# Patient Record
Sex: Female | Born: 1945 | Race: White | Hispanic: No | Smoking: Former smoker
Health system: Southern US, Community
[De-identification: ages and names within clinical notes are randomized; demographics above are authoritative.]

## PROBLEM LIST (undated history)

## (undated) DIAGNOSIS — M1990 Unspecified osteoarthritis, unspecified site: Secondary | ICD-10-CM

## (undated) DIAGNOSIS — K219 Gastro-esophageal reflux disease without esophagitis: Secondary | ICD-10-CM

## (undated) DIAGNOSIS — Z8619 Personal history of other infectious and parasitic diseases: Secondary | ICD-10-CM

## (undated) DIAGNOSIS — I2510 Atherosclerotic heart disease of native coronary artery without angina pectoris: Secondary | ICD-10-CM

## (undated) DIAGNOSIS — R519 Headache, unspecified: Secondary | ICD-10-CM

## (undated) DIAGNOSIS — E0590 Thyrotoxicosis, unspecified without thyrotoxic crisis or storm: Secondary | ICD-10-CM

## (undated) DIAGNOSIS — E785 Hyperlipidemia, unspecified: Secondary | ICD-10-CM

## (undated) DIAGNOSIS — R51 Headache: Secondary | ICD-10-CM

## (undated) DIAGNOSIS — R55 Syncope and collapse: Secondary | ICD-10-CM

## (undated) DIAGNOSIS — I10 Essential (primary) hypertension: Secondary | ICD-10-CM

## (undated) HISTORY — PX: TUBAL LIGATION: SHX77

## (undated) HISTORY — DX: Unspecified osteoarthritis, unspecified site: M19.90

## (undated) HISTORY — DX: Hyperlipidemia, unspecified: E78.5

## (undated) HISTORY — DX: Syncope and collapse: R55

## (undated) HISTORY — DX: Essential (primary) hypertension: I10

## (undated) HISTORY — DX: Thyrotoxicosis, unspecified without thyrotoxic crisis or storm: E05.90

## (undated) HISTORY — DX: Gastro-esophageal reflux disease without esophagitis: K21.9

## (undated) HISTORY — DX: Personal history of other infectious and parasitic diseases: Z86.19

---

## 2008-06-10 ENCOUNTER — Encounter: Payer: Self-pay | Admitting: Family Medicine

## 2008-07-18 ENCOUNTER — Encounter (INDEPENDENT_AMBULATORY_CARE_PROVIDER_SITE_OTHER): Payer: Self-pay | Admitting: *Deleted

## 2008-07-18 LAB — CONVERTED CEMR LAB
Glucose, Bld: 104 mg/dL
Hgb A1c MFr Bld: 6.6 %

## 2009-06-04 ENCOUNTER — Encounter (INDEPENDENT_AMBULATORY_CARE_PROVIDER_SITE_OTHER): Payer: Self-pay | Admitting: *Deleted

## 2009-06-10 ENCOUNTER — Ambulatory Visit: Payer: Self-pay | Admitting: Family Medicine

## 2009-06-10 DIAGNOSIS — E785 Hyperlipidemia, unspecified: Secondary | ICD-10-CM | POA: Insufficient documentation

## 2009-06-10 DIAGNOSIS — R209 Unspecified disturbances of skin sensation: Secondary | ICD-10-CM | POA: Insufficient documentation

## 2009-06-10 DIAGNOSIS — R42 Dizziness and giddiness: Secondary | ICD-10-CM | POA: Insufficient documentation

## 2009-06-10 DIAGNOSIS — E278 Other specified disorders of adrenal gland: Secondary | ICD-10-CM | POA: Insufficient documentation

## 2009-06-10 DIAGNOSIS — E039 Hypothyroidism, unspecified: Secondary | ICD-10-CM | POA: Insufficient documentation

## 2009-06-10 DIAGNOSIS — N644 Mastodynia: Secondary | ICD-10-CM | POA: Insufficient documentation

## 2009-06-10 DIAGNOSIS — J309 Allergic rhinitis, unspecified: Secondary | ICD-10-CM | POA: Insufficient documentation

## 2009-06-10 DIAGNOSIS — K219 Gastro-esophageal reflux disease without esophagitis: Secondary | ICD-10-CM | POA: Insufficient documentation

## 2009-06-10 DIAGNOSIS — I10 Essential (primary) hypertension: Secondary | ICD-10-CM | POA: Insufficient documentation

## 2009-06-15 ENCOUNTER — Telehealth (INDEPENDENT_AMBULATORY_CARE_PROVIDER_SITE_OTHER): Payer: Self-pay | Admitting: *Deleted

## 2009-06-15 LAB — CONVERTED CEMR LAB
ALT: 38 units/L — ABNORMAL HIGH (ref 0–35)
AST: 33 units/L (ref 0–37)
Albumin: 4.7 g/dL (ref 3.5–5.2)
Alkaline Phosphatase: 89 units/L (ref 39–117)
BUN: 17 mg/dL (ref 6–23)
Basophils Absolute: 0 10*3/uL (ref 0.0–0.1)
Basophils Relative: 0.6 % (ref 0.0–3.0)
Bilirubin, Direct: 0 mg/dL (ref 0.0–0.3)
CO2: 29 meq/L (ref 19–32)
Calcium: 9.7 mg/dL (ref 8.4–10.5)
Chloride: 101 meq/L (ref 96–112)
Cholesterol: 180 mg/dL (ref 0–200)
Cortisol, Plasma: 16.7 ug/dL
Creatinine, Ser: 0.8 mg/dL (ref 0.4–1.2)
Eosinophils Absolute: 0.1 10*3/uL (ref 0.0–0.7)
Eosinophils Relative: 1.3 % (ref 0.0–5.0)
GFR calc non Af Amer: 76.63 mL/min (ref >60)
Glucose, Bld: 105 mg/dL — ABNORMAL HIGH (ref 70–99)
HCT: 41.5 % (ref 36.0–46.0)
HDL: 52.4 mg/dL (ref >39.00)
Hemoglobin: 14.4 g/dL (ref 12.0–15.0)
LDL Cholesterol: 98 mg/dL (ref 0–99)
Lymphocytes Relative: 22.8 % (ref 12.0–46.0)
Lymphs Abs: 1.9 10*3/uL (ref 0.7–4.0)
MCHC: 34.9 g/dL (ref 30.0–36.0)
MCV: 89.9 fL (ref 78.0–100.0)
Monocytes Absolute: 0.7 10*3/uL (ref 0.1–1.0)
Monocytes Relative: 8.6 % (ref 3.0–12.0)
Neutro Abs: 5.6 10*3/uL (ref 1.4–7.7)
Neutrophils Relative %: 66.7 % (ref 43.0–77.0)
Platelets: 274 10*3/uL (ref 150.0–400.0)
Potassium: 3.9 meq/L (ref 3.5–5.1)
RBC: 4.61 M/uL (ref 3.87–5.11)
RDW: 13.4 % (ref 11.5–14.6)
Sodium: 139 meq/L (ref 135–145)
TSH: 3.31 microintl units/mL (ref 0.35–5.50)
Total Bilirubin: 0.5 mg/dL (ref 0.3–1.2)
Total CHOL/HDL Ratio: 3
Total Protein: 7.8 g/dL (ref 6.0–8.3)
Triglycerides: 148 mg/dL (ref 0.0–149.0)
VLDL: 29.6 mg/dL (ref 0.0–40.0)
Vit D, 25-Hydroxy: 11 ng/mL — ABNORMAL LOW (ref 30–89)
WBC: 8.3 10*3/uL (ref 4.5–10.5)

## 2009-07-10 ENCOUNTER — Other Ambulatory Visit: Admission: RE | Admit: 2009-07-10 | Discharge: 2009-07-10 | Payer: Self-pay | Admitting: Family Medicine

## 2009-07-10 ENCOUNTER — Ambulatory Visit: Payer: Self-pay | Admitting: Family Medicine

## 2009-07-16 ENCOUNTER — Encounter (INDEPENDENT_AMBULATORY_CARE_PROVIDER_SITE_OTHER): Payer: Self-pay | Admitting: *Deleted

## 2009-07-16 LAB — CONVERTED CEMR LAB: Pap Smear: NEGATIVE

## 2009-07-17 ENCOUNTER — Encounter (INDEPENDENT_AMBULATORY_CARE_PROVIDER_SITE_OTHER): Payer: Self-pay | Admitting: *Deleted

## 2009-07-28 ENCOUNTER — Encounter (INDEPENDENT_AMBULATORY_CARE_PROVIDER_SITE_OTHER): Payer: Self-pay | Admitting: *Deleted

## 2009-08-11 ENCOUNTER — Encounter: Admission: RE | Admit: 2009-08-11 | Discharge: 2009-08-11 | Payer: Self-pay | Admitting: Family Medicine

## 2009-08-11 ENCOUNTER — Encounter (INDEPENDENT_AMBULATORY_CARE_PROVIDER_SITE_OTHER): Payer: Self-pay | Admitting: *Deleted

## 2009-08-11 LAB — HM MAMMOGRAPHY: HM Mammogram: NORMAL

## 2009-08-13 ENCOUNTER — Ambulatory Visit: Payer: Self-pay | Admitting: Gastroenterology

## 2009-08-25 ENCOUNTER — Ambulatory Visit: Payer: Self-pay | Admitting: Gastroenterology

## 2009-08-27 ENCOUNTER — Encounter: Payer: Self-pay | Admitting: Gastroenterology

## 2009-08-28 LAB — HM COLONOSCOPY: HM Colonoscopy: NORMAL

## 2009-09-14 ENCOUNTER — Ambulatory Visit: Payer: Self-pay | Admitting: Family Medicine

## 2009-09-14 DIAGNOSIS — E559 Vitamin D deficiency, unspecified: Secondary | ICD-10-CM | POA: Insufficient documentation

## 2009-09-15 LAB — CONVERTED CEMR LAB: Vit D, 25-Hydroxy: 32 ng/mL (ref 30–89)

## 2009-12-02 ENCOUNTER — Ambulatory Visit (HOSPITAL_BASED_OUTPATIENT_CLINIC_OR_DEPARTMENT_OTHER): Admission: RE | Admit: 2009-12-02 | Discharge: 2009-12-02 | Payer: Self-pay | Admitting: Family Medicine

## 2009-12-02 ENCOUNTER — Ambulatory Visit: Payer: Self-pay | Admitting: Diagnostic Radiology

## 2009-12-02 ENCOUNTER — Ambulatory Visit: Payer: Self-pay | Admitting: Family Medicine

## 2009-12-02 DIAGNOSIS — M549 Dorsalgia, unspecified: Secondary | ICD-10-CM | POA: Insufficient documentation

## 2010-02-05 ENCOUNTER — Ambulatory Visit: Payer: Self-pay | Admitting: Family Medicine

## 2010-02-16 ENCOUNTER — Telehealth (INDEPENDENT_AMBULATORY_CARE_PROVIDER_SITE_OTHER): Payer: Self-pay | Admitting: *Deleted

## 2010-03-29 ENCOUNTER — Telehealth (INDEPENDENT_AMBULATORY_CARE_PROVIDER_SITE_OTHER): Payer: Self-pay | Admitting: *Deleted

## 2010-03-30 NOTE — Letter (Signed)
Summary: [facility] Instructions  [facility]  [address] [place], [place] [address]   Phone: [phone]  Fax: [phone]       IMAGENE BOSS    [birth year]    MRN: [id number]        Procedure Day /Date:  Tuesday 08/25/2009     Arrival Time: 10:00 am      Procedure Time: 11:00  am     Location of Procedure:                    _ x_  [facility] (4th Floor)                        PREPARATION FOR COLONOSCOPY WITH MOVIPREP   Starting 5 days prior to your procedure Thursday 6/23 do not eat nuts, seeds, popcorn, corn, beans, peas,  salads, or any raw vegetables.  Do not take any fiber supplements (e.g. Metamucil, Citrucel, and Benefiber).  THE DAY BEFORE YOUR PROCEDURE         DATE: Monday 6/27  1.  Drink clear liquids the entire day-NO SOLID FOOD  2.  Do not drink anything colored red or purple.  Avoid juices with pulp.  No orange juice.  3.  Drink at least 64 oz. (8 glasses) of fluid/clear liquids during the day to prevent dehydration and help the prep work efficiently.  CLEAR LIQUIDS INCLUDE: Water Jello Ice Popsicles Tea (sugar ok, no milk/cream) Powdered fruit flavored drinks Coffee (sugar ok, no milk/cream) Gatorade Juice: apple, white grape, white cranberry  Lemonade Clear bullion, consomm, broth Carbonated beverages (any kind) Strained chicken noodle soup Hard Candy                             4.  In the morning, mix first dose of MoviPrep solution:    Empty 1 Pouch A and 1 Pouch B into the disposable container    Add lukewarm drinking water to the top line of the container. Mix to dissolve    Refrigerate (mixed solution should be used within 24 hrs)  5.  Begin drinking the prep at 5:00 p.m. The MoviPrep container is divided by 4 marks.   Every 15 minutes drink the solution down to the next mark (approximately 8 oz) until the full liter is complete.   6.  Follow completed prep with 16 oz of clear liquid of your choice  (Nothing red or purple).  Continue to drink clear liquids until bedtime.  7.  Before going to bed, mix second dose of MoviPrep solution:    Empty 1 Pouch A and 1 Pouch B into the disposable container    Add lukewarm drinking water to the top line of the container. Mix to dissolve    Refrigerate  THE DAY OF YOUR PROCEDURE      DATE: Tuesday 6/28  Beginning at 6:00 a.m. (5 hours before procedure):         1. Every 15 minutes, drink the solution down to the next mark (approx 8 oz) until the full liter is complete.  2. Follow completed prep with 16 oz. of clear liquid of your choice.    3. You may drink clear liquids until 9:00 am (2 HOURS BEFORE PROCEDURE).   MEDICATION INSTRUCTIONS  Unless otherwise instructed, you should take regular prescription medications with a small sip of water   as early as possible the morning  of your procedure.    Additional medication instructions: Do not take HCTZ morning of procedure.         OTHER INSTRUCTIONS  You will need a responsible adult at least 66 years of age to accompany you and drive you home.   This person must remain in the waiting room during your procedure.  Wear loose fitting clothing that is easily removed.  Leave jewelry and other valuables at home.  However, you may wish to bring a book to read or  an iPod/MP3 player to listen to music as you wait for your procedure to start.  Remove all body piercing jewelry and leave at home.  Total time from sign-in until discharge is approximately 2-3 hours.  You should go home directly after your procedure and rest.  You can resume normal activities the  day after your procedure.  The day of your procedure you should not:   Drive   Make legal decisions   Operate machinery   Drink alcohol   Return to work  You will receive specific instructions about eating, activities and medications before you leave.    The above instructions have been reviewed and explained to  me by   Ezra Sites RN  August 13, 2009 9:32 AM     I fully understand and can verbalize these instructions _____________________________ Date _________

## 2010-03-30 NOTE — Letter (Signed)
Summary: Cancer Screening/[facility] Personalized Risk Profile  Cancer Screening/[facility] Personalized Risk Profile   Imported By: Lanelle Bal 07/16/2009 12:51:15  _____________________________________________________________________  External Attachment:    Type:   Image     Comment:   External Document

## 2010-03-30 NOTE — Assessment & Plan Note (Signed)
Summary: CPX--PH   Vital Signs:  Patient profile:   65 year old female Height:      66.50 inches Weight:      187 pounds BMI:     29.84 Pulse rate:   57 / minute BP sitting:   130 / 72  (left arm)  Vitals Entered By: Doristine Devoid (Jul 10, 2009 8:43 AM) CC: CPX AND PAP   History of Present Illness: 65 yo woman here today for CPE.  parasthesia of thigh- improved w/out taking Neurontin  Health Maintainence- due for mammogram, overdue for colonoscopy.  Preventive Screening-Counseling & Management  Alcohol-Tobacco     Alcohol drinks/day: <1     Smoking Status: never  Caffeine-Diet-Exercise     Does Patient Exercise: yes     Type of exercise: walking      Drug Use:  never.    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 Take One Tablet Daily 2)  Lisinopril 40 Mg Tabs (Lisinopril) .... Take One Tablet Daily 3)  Hydrocortisone 20 Mg Tabs (Hydrocortisone) .... Take One Tablet Daily 4)  Hydrochlorothiazide 25 Mg Tabs (Hydrochlorothiazide) .... Take One Tablet Daily 5)  Paroxetine Hcl 10 Mg Tabs (Paroxetine Hcl) .... Take One Tablet Daily 6)  Amlodipine Besylate 10 Mg Tabs (Amlodipine Besylate) .... Take One Tablet Daily 7)  Simvastatin 40 Mg Tabs (Simvastatin) .... Take One Tablet Daily 8)  Ergocalciferol 50000 Unit Caps (Ergocalciferol) .Marland Kitchen.. 1 By Mouth Weekly For 12 Weeks  Allergies (verified): 1)  ! 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    Physical Exam  General:  Well-developed,well-nourished,in no acute distress; alert,appropriate and cooperative throughout examination Head:  NCAT Eyes:  PERRL, EOMI, fundi WNL Ears:  External ear exam shows no significant lesions or deformities.  Otoscopic examination reveals clear canals, tympanic membranes are intact bilaterally without bulging, retraction, inflammation or discharge. Hearing is grossly normal bilaterally. Nose:  External nasal examination shows no deformity or inflammation. Nasal mucosa are pink and moist without lesions or exudates. Mouth:  Oral mucosa and oropharynx without lesions or exudates.  Teeth in good repair. Neck:  No deformities, masses, or tenderness noted. Breasts:  No mass, nodules, thickening, tenderness, bulging, retraction, inflamation, nipple discharge or skin changes noted.   Lungs:  Normal respiratory effort, chest expands symmetrically. Lungs are clear to auscultation, no crackles or wheezes. Heart:  Normal rate and regular  rhythm. S1 and S2 normal without gallop, murmur, click, rub or other  extra sounds. Abdomen:  soft, NT/ND, +BS Genitalia:  Normal introitus for age, no external lesions, no vaginal discharge, mucosa pink and moist, no vaginal or cervical lesions, no vaginal atrophy, no friaility or hemorrhage, normal uterus size and position, no adnexal masses or tenderness Msk:  No deformity or scoliosis noted of thoracic or lumbar spine.   Pulses:  +2 carotid, radial, DP Extremities:  No clubbing, cyanosis, edema, or deformity noted with normal full range of motion of all joints.   Neurologic:  No cranial nerve deficits noted. Station and gait are normal. Plantar reflexes are down-going bilaterally. DTRs are symmetrical throughout. Sensory, motor and coordinative functions appear intact. Skin:  Intact without suspicious lesions or rashes Cervical Nodes:  No lymphadenopathy noted Axillary Nodes:  No palpable lymphadenopathy Psych:  Cognition and judgment appear intact. Alert and cooperative with normal attention span and concentration. No apparent delusions, illusions, hallucinations   Impression & Recommendations:  Problem # 1:  PHYSICAL EXAMINATION (ICD-V70.0) Assessment Unchanged  pt's PE WNL.  labs from last visit reviewed.  pap collected.  anticipatory guidance provided.  Orders: Radiology Referral (Radiology)  Problem # 2:  SPECIAL SCREENING FOR MALIGNANT NEOPLASMS COLON (ICD-V76.51) Assessment: New routine screen due Orders: Gastroenterology Referral (GI)  Complete Medication List: 1)  Levothyroxine Sodium 50 Mcg Tabs (Levothyroxine sodium) .... Take one tablet daily 2)  Lisinopril 40 Mg Tabs (Lisinopril) .... Take one tablet daily 3)  Hydrocortisone 20 Mg Tabs (Hydrocortisone) .... Take one tablet daily 4)  Hydrochlorothiazide 25 Mg Tabs (Hydrochlorothiazide) .... Take one tablet daily 5)  Paroxetine Hcl 10 Mg Tabs (Paroxetine hcl) .... Take one tablet daily 6)  Amlodipine Besylate 10 Mg Tabs (Amlodipine besylate) .... Take one tablet daily 7)  Simvastatin  40 Mg Tabs (Simvastatin) .... Take one tablet daily 8)  Ergocalciferol 50000 Unit Caps (Ergocalciferol) .Marland Kitchen.. 1 by mouth weekly for 12 weeks  Other Orders: EKG w/ Interpretation (93000)  Patient Instructions: 1)  Please schedule a follow-up appointment in 6 months to recheck blood pressure and cholesterol. 2)  Someone will call you with your mammogram and colonoscopy appts 3)  Make sure you get your Vit D rechecked when you finish your meds 4)  Keep up the good work on diet and exercise 5)  Call with any questions or concerns 6)  Have a great trip!

## 2010-03-30 NOTE — Progress Notes (Signed)
Summary: labs-lmom  Phone Note Outgoing Call   Call placed by: Doristine Devoid,  June 15, 2009 10:54 AM Call placed to: Patient Summary of Call: labs normal.  pt's cortisol level is normal.  once we receive her records and determine what she is taking the hydrocortisone for we can discuss weaning or stopping meds.  level is very low.  needs 50,000 units x 12 weeks and then recheck level  Follow-up for Phone Call        left message on machine ........Marland KitchenDoristine Devoid  June 15, 2009 10:54 AM   Pt informed of lab and rx telephoned to pharmacy, pt scheduled for lab .Kandice Hams  June 16, 2009 2:53 PM  Follow-up by: Kandice Hams,  June 16, 2009 2:53 PM    New/Updated Medications: ERGOCALCIFEROL 50000 UNIT CAPS (ERGOCALCIFEROL) 1 by mouth weekly for 12 weeks Prescriptions: ERGOCALCIFEROL 50000 UNIT CAPS (ERGOCALCIFEROL) 1 by mouth weekly for 12 weeks  #12 x 0   Entered by:   Kandice Hams   Authorized by:   Neena Rhymes MD   Signed by:   Kandice Hams on 06/16/2009   Method used:   Telephoned to ...       [facility] # [phone]* (retail)       [address]       [place], [place]  [address]       Ph: [phone]       Fax: [phone]   RxID:   [id number]

## 2010-03-30 NOTE — Letter (Signed)
Summary: New Patient Letter  [facility]  [address] [place], [place] [address]   Phone: [phone]  Fax: [phone]       06/04/2009 MRN: [id number]  Ann Wagner [address] [place], [place]  [address]  Dear [name],   Welcome to [facility] and thank you for choosing [place] as your Primary Care Providers. Enclosed you will find information about our practice that we hope you find helpful. We have also enclosed forms to be filled out prior to your visit. This will provide [place] with the necessary information and facilitate your being seen in a timely manner. If you have any questions, please call us at:  [phone]        and we will be happy to assist you. We look forward to seeing you at your scheduled appointment time.  Appointment   Morene Antu 10,2725 @ 10:00AM            with Dr.    Beverely Low             Sincerely,  Primary Health Care Team  Please arrive 15 minutes early for your first appointment and bring your insurance card. Co-pay is required at the time of your visit.  *****Please call the office if you are not able to keep this appointment. There is a charge of $50.00 if any appointment is not cancelled or rescheduled within 24 hours.

## 2010-03-30 NOTE — Assessment & Plan Note (Signed)
Summary: back pain/cbs   Vital Signs:  Patient profile:   65 year old female Height:      66.50 inches Weight:      181 pounds BMI:     28.88 O2 Sat:      96 % on Room air Temp:     97.9 degrees F oral Pulse rate:   57 / minute BP sitting:   110 / 72  (left arm)  Vitals Entered By: Doristine Devoid CMA (December 02, 2009 2:10 PM)  O2 Flow:  Room air CC: back pain- hx of walking pneumonia some discomfort w/ deep breaths    History of Present Illness: 65 yo woman here today w/ back pain.  sxs started last week as a stabbing pain in L thoracic area.  took 600mg  of ibuprofen two times a day w/ some improvement.  feeling febrile but no temp.  + body aches.  dry cough, no improvement w/ mucinex.  hx of 'walking PNA'.  chronic steroid use- has been on hydrocortisone for 3 yrs.  has been taking 10mg  x18 months.  would like to wean  Current Medications (verified): 1)  Levothyroxine Sodium 50 Mcg Tabs (Levothyroxine Sodium) .... Take One Tablet Daily 2)  Lisinopril 40 Mg Tabs (Lisinopril) .... Take One Tablet Daily 3)  Hydrocortisone 5 Mg Tabs (Hydrocortisone) .Marland Kitchen.. 1 Tab Daily 4)  Hydrochlorothiazide 25 Mg Tabs (Hydrochlorothiazide) .... Take One Tablet Daily 5)  Paroxetine Hcl 10 Mg Tabs (Paroxetine Hcl) .... Take One Tablet Daily 6)  Amlodipine Besylate 10 Mg Tabs (Amlodipine Besylate) .... Take One Tablet Daily 7)  Simvastatin 40 Mg Tabs (Simvastatin) .... Take One Tablet Daily 8)  Ergocalciferol 50000 Unit Caps (Ergocalciferol) .Marland Kitchen.. 1 By Mouth Weekly For 12 Weeks 9)  Ibuprofen 800 Mg Tabs (Ibuprofen) .Marland Kitchen.. 1 Tab Every 8 Hrs As Needed For Pain.  Take W/ Food.  Allergies (verified): 1)  ! Darvocet  Review of Systems      See HPI  Physical Exam  General:  Well-developed,well-nourished,in no acute distress; alert,appropriate and cooperative throughout examination Head:  NCAT, no TTP over sinuses Eyes:  no injxn or inflammation Ears:  External ear exam shows no significant lesions or  deformities.  Otoscopic examination reveals clear canals, tympanic membranes are intact bilaterally without bulging, retraction, inflammation or discharge. Hearing is grossly normal bilaterally. Nose:  mild congestion Mouth:  Oral mucosa and oropharynx without lesions or exudates.  Teeth in good repair. Neck:  No deformities, masses, or tenderness noted. Lungs:  Normal respiratory effort, chest expands symmetrically. Lungs are clear to auscultation, no crackles or wheezes. Heart:  Normal rate and regular rhythm. S1 and S2 normal without gallop, murmur, click, rub or other extra sounds. Msk:  pt w/ TTP over L lat, no bony tenderness Pulses:  +2 carotid, radial, DP Extremities:  no C/C/E   Impression & Recommendations:  Problem # 1:  BACK PAIN (ICD-724.5) Assessment New pt's pain likely muscular given location and lack of bony tenderness.  pain improves w/ NSAIDs.  use heating pad for relief.  check CXR given pt's hx of PNA.  reviewed supportive care and red flags that should prompt return.  Pt expresses understanding and is in agreement w/ this plan. Her updated medication list for this problem includes:    Ibuprofen 800 Mg Tabs (Ibuprofen) .Marland Kitchen... 1 tab every 8 hrs as needed for pain.  take w/ food.  Orders: T-2 View CXR (71020TC)  Problem # 2:  OTHER SPECIFIED DISORDERS OF ADRENAL GLANDS (  ICD-255.8) Assessment: Unchanged pt's cortisol levels have been stable, would like to wean off meds.  will do slow wean given pt's chronic steroid use.  decrease to 5 mg daily.  plan on at least 6 months at this dose.  Complete Medication List: 1)  Levothyroxine Sodium 50 Mcg Tabs (Levothyroxine sodium) .... Take one tablet daily 2)  Lisinopril 40 Mg Tabs (Lisinopril) .... Take one tablet daily 3)  Hydrocortisone 5 Mg Tabs (Hydrocortisone) .Marland Kitchen.. 1 tab daily 4)  Hydrochlorothiazide 25 Mg Tabs (Hydrochlorothiazide) .... Take one tablet daily 5)  Paroxetine Hcl 10 Mg Tabs (Paroxetine hcl) .... Take one  tablet daily 6)  Amlodipine Besylate 10 Mg Tabs (Amlodipine besylate) .... Take one tablet daily 7)  Simvastatin 40 Mg Tabs (Simvastatin) .... Take one tablet daily 8)  Ergocalciferol 50000 Unit Caps (Ergocalciferol) .Marland Kitchen.. 1 by mouth weekly for 12 weeks 9)  Ibuprofen 800 Mg Tabs (Ibuprofen) .Marland Kitchen.. 1 tab every 8 hrs as needed for pain.  take w/ food.  Patient Instructions: 1)  Follow up as needed 2)  Go to the [facility] (intersection of [address]) for your chest xray 3)  Take the ibuprofen as needed for pain 4)  Use a heating pad for pain relief 5)  Add Claritin or Zyrtec for the allergy component 6)  I think this is muscular and should improve! 7)  Hang in there!!! Prescriptions: IBUPROFEN 800 MG TABS (IBUPROFEN) 1 tab every 8 hrs as needed for pain.  take w/ food.  #90 x 1   Entered and Authorized by:   Neena Rhymes MD   Signed by:   Neena Rhymes MD on 12/02/2009   Method used:   Electronically to        [facility] # [phone]* (retail)       [address]       [place], [place]  [address]       Ph: [phone]       Fax: [phone]   RxID:   [id number] HYDROCORTISONE 5 MG TABS (HYDROCORTISONE) 1 tab daily  #30 x 6   Entered and Authorized by:   Neena Rhymes MD   Signed by:   Neena Rhymes MD on 12/02/2009   Method used:   Electronically to        [facility] # [phone]* (retail)       [address]       [place], [place]  [address]       Ph: [phone]       Fax: [phone]   RxID:   [id number]

## 2010-03-30 NOTE — Assessment & Plan Note (Signed)
Summary: cough, sinus issue//fd   Vital Signs:  Patient profile:   65 year old female Weight:      184 pounds BMI:     29.36 Temp:     98.4 degrees F oral BP sitting:   130 / 80  (left arm)  Vitals Entered By: Doristine Devoid CMA (February 05, 2010 3:59 PM) CC: sinus drainage and cough x2 days    History of Present Illness: 65 yo woman here today for sinus congestion.  sxs started 2-3 days ago.  cough productive of 'yellow/green' sputum.  cough worse in AM/PM.  no fevers.  some tightness in chest w/ deep breaths.  denies facial pain.  ears feel full.  denies sore throat.  no known sick contacts.  Current Medications (verified): 1)  Levothyroxine Sodium 50 Mcg Tabs (Levothyroxine Sodium) .... Take One Tablet Daily 2)  Lisinopril 40 Mg Tabs (Lisinopril) .... Take One Tablet Daily 3)  Hydrocortisone 5 Mg Tabs (Hydrocortisone) .Marland Kitchen.. 1 Tab Daily 4)  Hydrochlorothiazide 25 Mg Tabs (Hydrochlorothiazide) .... Take One Tablet Daily 5)  Paroxetine Hcl 10 Mg Tabs (Paroxetine Hcl) .... Take One Tablet Daily 6)  Amlodipine Besylate 10 Mg Tabs (Amlodipine Besylate) .... Take One Tablet Daily 7)  Simvastatin 40 Mg Tabs (Simvastatin) .... Take One Tablet Daily 8)  Ergocalciferol 50000 Unit Caps (Ergocalciferol) .Marland Kitchen.. 1 By Mouth Weekly For 12 Weeks 9)  Ibuprofen 800 Mg Tabs (Ibuprofen) .Marland Kitchen.. 1 Tab Every 8 Hrs As Needed For Pain.  Take W/ Food.  Allergies (verified): 1)  ! Darvocet  Review of Systems      See HPI  Physical Exam  General:  Well-developed,well-nourished,in no acute distress; alert,appropriate and cooperative throughout examination Head:  NCAT, no TTP over sinuses Eyes:  no injxn or inflammation Ears:  External ear exam shows no significant lesions or deformities.  Otoscopic examination reveals clear canals, tympanic membranes are intact bilaterally without bulging, retraction, inflammation or discharge. Hearing is grossly normal bilaterally. Nose:  mild congestion Mouth:  Oral  mucosa and oropharynx without lesions or exudates.  Teeth in good repair. Neck:  No deformities, masses, or tenderness noted. Lungs:  Normal respiratory effort, chest expands symmetrically. Lungs are clear to auscultation, no crackles or wheezes.  dry cough during exam Heart:  Normal rate and regular rhythm. S1 and S2 normal without gallop, murmur, click, rub or other extra sounds.   Impression & Recommendations:  Problem # 1:  BRONCHITIS- ACUTE (ICD-466.0) Assessment New pt very concerned about the upcoming weekend and 'a history of things settling in my chest'.  start Zpack.  reviewed supportive care and red flags that should prompt return.  Pt expresses understanding and is in agreement w/ this plan. Her updated medication list for this problem includes:    Azithromycin 250 Mg Tabs (Azithromycin) .Marland Kitchen... 2 by  mouth today and then 1 daily for 4 days    Tessalon 200 Mg Caps (Benzonatate) .Marland Kitchen... Take one capsule by mouth three times a day as needed for cough  Complete Medication List: 1)  Levothyroxine Sodium 50 Mcg Tabs (Levothyroxine sodium) .... Take one tablet daily 2)  Lisinopril 40 Mg Tabs (Lisinopril) .... Take one tablet daily 3)  Hydrocortisone 5 Mg Tabs (Hydrocortisone) .Marland Kitchen.. 1 tab daily 4)  Hydrochlorothiazide 25 Mg Tabs (Hydrochlorothiazide) .... Take one tablet daily 5)  Paroxetine Hcl 10 Mg Tabs (Paroxetine hcl) .... Take one tablet daily 6)  Amlodipine Besylate 10 Mg Tabs (Amlodipine besylate) .... Take one tablet daily 7)  Simvastatin 40  Mg Tabs (Simvastatin) .... Take one tablet daily 8)  Ergocalciferol 50000 Unit Caps (Ergocalciferol) .Marland Kitchen.. 1 by mouth weekly for 12 weeks 9)  Ibuprofen 800 Mg Tabs (Ibuprofen) .Marland Kitchen.. 1 tab every 8 hrs as needed for pain.  take w/ food. 10)  Azithromycin 250 Mg Tabs (Azithromycin) .... 2 by  mouth today and then 1 daily for 4 days 11)  Tessalon 200 Mg Caps (Benzonatate) .... Take one capsule by mouth three times a day as needed for cough  Patient  Instructions: 1)  This is an early bronchitis 2)  Take the Azithromycin as directed 3)  Use the Tessalon as needed for cough 4)  Continue the Mucinex to thin your congestion 5)  Drink plenty of fluids 6)  Tylenol/Ibuprofen as needed for pain or fever 7)  Hang in there!!! 8)  Happy Holidays! Prescriptions: TESSALON 200 MG CAPS (BENZONATATE) Take one capsule by mouth three times a day as needed for cough  #60 x 0   Entered and Authorized by:   Neena Rhymes MD   Signed by:   Neena Rhymes MD on 02/05/2010   Method used:   Electronically to        [facility] # [phone]* (retail)       [address]       [place], [place]  [address]       Ph: [phone]       Fax: [phone]   RxID:   [id number] AZITHROMYCIN 250 MG  TABS (AZITHROMYCIN) 2 by  mouth today and then 1 daily for 4 days  #6 x 0   Entered and Authorized by:   Neena Rhymes MD   Signed by:   Neena Rhymes MD on 02/05/2010   Method used:   Electronically to        [facility] # [phone]* (retail)       [address]       [place], [place]  [address]       Ph: [phone]       Fax: [phone]   RxID:   [id number]    Orders Added: 1)  Est. Patient Level III [[address]]

## 2010-03-30 NOTE — Miscellaneous (Signed)
Summary: [facility]  Clinical Lists Changes  Medications: Added new medication of MOVIPREP 100 GM  SOLR (PEG-KCL-NACL-NASULF-NA ASC-C) As per prep instructions. - Signed Rx of MOVIPREP 100 GM  SOLR (PEG-KCL-NACL-NASULF-NA ASC-C) As per prep instructions.;  #1 x 0;  Signed;  Entered by: Ezra Sites RN;  Authorized by: Louis Meckel MD;  Method used: Electronically to [facility] # [phone]*, [address], [place], [place]  [address], Ph: [phone], Fax: [phone]    Prescriptions: MOVIPREP 100 GM  SOLR (PEG-KCL-NACL-NASULF-NA ASC-C) As per prep instructions.  #1 x 0   Entered by:   Ezra Sites RN   Authorized by:   Louis Meckel MD   Signed by:   Ezra Sites RN on 08/13/2009   Method used:   Electronically to        [facility] # [phone]* (retail)       [address]       [place], [place]  [address]       Ph: [phone]       Fax: [phone]   RxID:   [id number]

## 2010-03-30 NOTE — Miscellaneous (Signed)
  Clinical Lists Changes  Observations: Added new observation of HGBA1C: 6.6 % (07/18/2008 15:23) Added new observation of GLUCOSE SER: 104 mg/dL (16/11/9602 54:09) Added new observation of BONE DENSITY: normal std dev (06/10/2008 15:38)      Preventive Care Screening  Bone Density:    Date:  06/10/2008    Results:  normal std dev

## 2010-03-30 NOTE — Letter (Signed)
Summary: Results Letter  [facility]  [address] [place], [place] [address]   Phone: [phone]  Fax: [phone]        August 27, 2009 MRN: [id number]    Ann Wagner [address] [place], [place]  [address]    Dear [name],  Your biiopsy results did not show any remarkable findings.  I recommend that you undergo followup surveillance colonloscopy in 10 years. Please continue with the recommendations previously discussed.  Should you have any further questions or immediate concers, feel free to contact me.  Sincerely,  Barbette Hair. Arlyce Dice, M.D., [facility]          Sincerely,  Louis Meckel MD  This letter has been electronically signed by your physician.  Appended Document: Results Letter letter mailed 7.6.11

## 2010-03-30 NOTE — Letter (Signed)
Summary: Previsit letter  [facility]  [address] [place], [place] [address]   Phone: [phone]  Fax: [phone]       07/17/2009 MRN: [id number]  Ann Wagner [address] [place], [place]  [address]  Dear [name],  Welcome to the Gastroenterology Division at [facility].    You are scheduled to see a nurse for your pre-procedure visit on 08-13-09 at 9:00a.m. on the 3rd floor at [facility], [address]  We ask that you try to arrive at our office 15 minutes prior to your appointment time to allow for check-in.  Your nurse visit will consist of discussing your medical and surgical history, your immediate family medical history, and your medications.    Please bring a complete list of all your medications or, if you prefer, bring the medication bottles and we will list them.  We will need to be aware of both prescribed and over the counter drugs.  We will need to know exact dosage information as well.  If you are on blood thinners (Coumadin, Plavix, Aggrenox, Ticlid, etc.) please call our office today/prior to your appointment, as we need to consult with your physician about holding your medication.   Please be prepared to read and sign documents such as consent forms, a financial agreement, and acknowledgement forms.  If necessary, and with your consent, a friend or relative is welcome to sit-in on the nurse visit with you.  Please bring your insurance card so that we may make a copy of it.  If your insurance requires a referral to see a specialist, please bring your referral form from your primary care physician.  No co-pay is required for this nurse visit.     If you cannot keep your appointment, please call [phone] to cancel or reschedule prior to your appointment date.  This allows [place] the opportunity to schedule an appointment for another patient in need of care.    Thank you for choosing [facility] for your medical needs.   We appreciate the opportunity to care for you.  Please visit [place] at our website  to learn more about our practice.                     Sincerely.                                                                                                                   The Gastroenterology Division

## 2010-03-30 NOTE — Assessment & Plan Note (Signed)
Summary: NEW PT/BCBS/NS/KDC   Vital Signs:  Patient profile:   65 year old female Height:      66.50 inches Weight:      184 pounds BMI:     29.36 Temp:     98.2 degrees F oral Pulse rate:   60 / minute BP sitting:   140 / 70  (left arm)  Vitals Entered By: Doristine Devoid (June 10, 2009 10:18 AM) CC: NEW EST- pain in L leg x3 months  Comments -L leg symptoms burning, sensitive discomfort constant when walking    History of Present Illness: 65 yo woman here today to establish care.  recently moved from [place].  1) HTN- on Lisinopril, HCTZ, Amlodipine.  denies CP, SOB, HAs, visual changes, edema.  2) Hyperlipidemia- taking Simvastatin, had normal cath after slightly abnormal stress test.  no N/V, myalgias.  3) Hypothyroid- on replacement tx.  4) Dizziness- had (+) tilt test in [place], last month had dizzy spell after standing up too fast.  reports they started Paroxetine for dizziness.  still has occasional episodes.  was told after w/u that she had vagal episodes.  5) L leg pain- described as a cold sensation which then became a burning sensation.  first occured 'months ago'.  now occuring more frequently.  localized to L lateral thigh.  never developed a rash (pt feared shingles).  no weakness.  6) L breast pain- 'pinching' when overtired.  doesn't occur frequently.  no SOB, diaphoresis, nausea.  Preventive Screening-Counseling & Management  Alcohol-Tobacco     Alcohol drinks/day: <1     Smoking Status: never  Caffeine-Diet-Exercise     Does Patient Exercise: no      Drug Use:  never.    Current Medications (verified): 1)  Levothyroxine Sodium 50 Mcg Tabs (Levothyroxine Sodium) .... Take One Tablet Daily 2)  Lisinopril 40 Mg Tabs (Lisinopril) .... Take One Tablet Daily 3)  Hydrocortisone 20 Mg Tabs (Hydrocortisone) .... Take One Tablet Daily 4)  Hydrochlorothiazide 25 Mg Tabs (Hydrochlorothiazide) .... Take One Tablet Daily 5)  Paroxetine Hcl 10 Mg Tabs (Paroxetine Hcl) ....  Take One Tablet Daily 6)  Amlodipine Besylate 10 Mg Tabs (Amlodipine Besylate) .... Take One Tablet Daily 7)  Simvastatin 40 Mg Tabs (Simvastatin) .... Take One Tablet Daily  Allergies (verified): 1)  ! Darvocet  Past History:  Past Medical History: Arthritis hx of Chicken Pox Allergic rhinitis GERD Hyperlipidemia Hyperthyroidism Syncope  Past Surgical History: Tubal ligation  Family History: CAD-mother MI,father,paternal grandmother HTN-mother,father,paternal grandmother DM-mother STROKE-no COLON CA-no BREAST CA-no  Social History: lives alone 2 sons- 1 locally w/ 5 kids Engineer, agricultural- retired enjoys antique showsSmoking Status:  never Does Patient Exercise:  no Drug Use:  never  Review of Systems       The patient complains of syncope.  The patient denies anorexia, fever, vision loss, decreased hearing, chest pain, dyspnea on exertion, peripheral edema, prolonged cough, headaches, and abdominal pain.    Physical Exam  General:  Well-developed,well-nourished,in no acute distress; alert,appropriate and cooperative throughout examination Head:  NCAT Eyes:  PERRL, EOMI, fundi WNL Ears:  External ear exam shows no significant lesions or deformities.  Otoscopic examination reveals clear canals, tympanic membranes are intact bilaterally without bulging, retraction, inflammation or discharge. Hearing is grossly normal bilaterally. Mouth:  Oral mucosa and oropharynx without lesions or exudates.  Teeth in good repair. Neck:  No deformities, masses, or tenderness noted. Lungs:  Normal respiratory effort, chest expands symmetrically. Lungs are clear to auscultation, no  crackles or wheezes. Heart:  Normal rate and regular rhythm. S1 and S2 normal without gallop, murmur, click, rub or other extra sounds. Abdomen:  soft, NT/ND, +BS Msk:  strength in LEs 5/5 full ROM back flexion and extension (-) SLR bilaterally Pulses:  +2 carotid, radial, DP Extremities:  no  C/C/E Neurologic:  alert & oriented X3, cranial nerves II-XII intact, strength normal in all extremities, gait normal, and DTRs symmetrical and normal.  parasthesias of L lateral thigh   Impression & Recommendations:  Problem # 1:  HYPERTENSION, BENIGN ESSENTIAL (ICD-401.1) Assessment New adequately controlled on current meds.  asymptomatic.  check labs. Her updated medication list for this problem includes:    Lisinopril 40 Mg Tabs (Lisinopril) .Marland Kitchen... Take one tablet daily    Hydrochlorothiazide 25 Mg Tabs (Hydrochlorothiazide) .Marland Kitchen... Take one tablet daily    Amlodipine Besylate 10 Mg Tabs (Amlodipine besylate) .Marland Kitchen... Take one tablet daily  Orders: TLB-BMP (Basic Metabolic Panel-BMET) (80048-METABOL) TLB-CBC Platelet - w/Differential (85025-CBCD)  Problem # 2:  HYPERTHYROIDISM (ICD-242.90) Assessment: New on replacement tx.  check labs. Orders: TLB-TSH (Thyroid Stimulating Hormone) (84443-TSH)  Problem # 3:  HYPERLIPIDEMIA (ICD-272.4) Assessment: New tolerating statin.  check labs.  adjust med as needed. Her updated medication list for this problem includes:    Simvastatin 40 Mg Tabs (Simvastatin) .Marland Kitchen... 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  pt reports she is due for labs.  will have better understanding of issue once pt's records are received. Orders: TLB-Cortisol (82533-CORT)  Problem # 5:  DIZZINESS (ICD-780.4) Assessment: New pt has had complete w/u- was told she has vagal events.  pt had syncopal episode last month.  stressed importance of informing me if events become more frequent.  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  offered pt nerve conduction study- she  declined.  opted to start neurontin to see if sxs improve.  will follow closely.  Problem # 7:  BREAST PAIN, LEFT (ICD-611.71) Assessment: New only occurs after pt has long days.  likely due to stretching of the suspensory ligaments.  encouraged new, supportive bra to take tension off ligaments.  Complete Medication List: 1)  Levothyroxine Sodium 50 Mcg Tabs (Levothyroxine sodium) .... Take one tablet daily 2)  Lisinopril 40 Mg Tabs (Lisinopril) .... Take one tablet daily 3)  Hydrocortisone 20 Mg Tabs (Hydrocortisone) .... Take one tablet daily 4)  Hydrochlorothiazide 25 Mg Tabs (Hydrochlorothiazide) .... Take one tablet daily 5)  Paroxetine Hcl 10 Mg Tabs (Paroxetine hcl) .... Take one tablet daily 6)  Amlodipine Besylate 10 Mg Tabs (Amlodipine besylate) .... Take one tablet daily 7)  Simvastatin 40 Mg Tabs (Simvastatin) .... Take one tablet daily 8)  Neurontin 300 Mg Caps (Gabapentin) .... Start 1 tab daily x1 week and then increase to two times a day x 1 week and then three times a day.  Other Orders: T-Vitamin D (25-Hydroxy) [id number])  Patient Instructions: 1)  Please schedule a follow-up appointment in 1 month for your physical. 2)  We'll notify you of your lab results and discuss them at your next visit 3)  Start the Neurontin as directed- may initially cause fatigue but this should improve with continued use 4)  If you have more frequent dizzy spells- notify me immediately 5)  Your chest  pain is likely related to pain in your breast ligaments- consider a new bra for better support 6)  Call with any questions or concerns 7)  Welcome!  We're glad to have you!  Prescriptions: SIMVASTATIN 40 MG TABS (SIMVASTATIN) take one tablet daily  #30 x 6   Entered and Authorized by:   Neena Rhymes MD   Signed by:   Neena Rhymes MD on 06/10/2009   Method used:   Electronically to        [facility] # [phone]* (retail)       [address]       [place], [place]  [address]        Ph: [phone]       Fax: [phone]   RxID:   [id number] AMLODIPINE BESYLATE 10 MG TABS (AMLODIPINE BESYLATE) take one tablet daily  #30 x 6   Entered and Authorized by:   Neena Rhymes MD   Signed by:   Neena Rhymes MD on 06/10/2009   Method used:   Electronically to        [facility] # [phone]* (retail)       [address]       [place], [place]  [address]       Ph: [phone]       Fax: [phone]   RxID:   [id number] PAROXETINE HCL 10 MG TABS (PAROXETINE HCL) take one tablet daily  #30 x 6   Entered and Authorized by:   Neena Rhymes MD   Signed by:   Neena Rhymes MD on 06/10/2009   Method used:   Electronically to        [facility] # [phone]* (retail)       [address]       [place], [place]  [address]       Ph: [phone]       Fax: [phone]   RxID:   [id number] HYDROCHLOROTHIAZIDE 25 MG TABS (HYDROCHLOROTHIAZIDE) take one tablet daily  #30 x 6   Entered and Authorized by:   Neena Rhymes MD   Signed by:   Neena Rhymes MD on 06/10/2009   Method used:   Electronically to        [facility] # [phone]* (retail)       [address]       [place], [place]  [address]       Ph: [phone]       Fax: [phone]   RxID:   [id number] HYDROCORTISONE 20 MG TABS (HYDROCORTISONE) take one tablet daily  #30 x 6   Entered and Authorized by:   Neena Rhymes MD   Signed by:   Neena Rhymes MD on 06/10/2009   Method used:   Electronically to        [facility] # [phone]* (retail)       [address]       [place], [place]  [address]       Ph: [phone]       Fax: [phone]   RxID:   [id number] LISINOPRIL 40 MG TABS (LISINOPRIL) take one tablet daily  #30 x 6   Entered and Authorized by:   Neena Rhymes MD   Signed by:   Neena Rhymes MD on 06/10/2009   Method used:   Electronically to        [facility] # [phone]* (retail)       [address]       [place], [place]  [address]       Ph: [phone]       Fax: [phone]   RxID:   [id number] LEVOTHYROXINE SODIUM 50 MCG TABS (LEVOTHYROXINE SODIUM) take one tablet daily  #30 x 6   Entered and Authorized by:   Neena Rhymes MD   Signed by:   Neena Rhymes MD on 06/10/2009   Method used:   Electronically to        [facility] # [phone]* (retail)       [address]       [place], [place]  [address]       Ph: [phone]       Fax: [phone]   RxID:   [id number] NEURONTIN 300 MG CAPS (GABAPENTIN) start 1 tab daily x1 week and then increase to two times a day x 1 week and then three times a day.  #90 x 3   Entered and Authorized by:   Neena Rhymes MD   Signed by:   Neena Rhymes MD on 06/10/2009   Method used:   Electronically to        [facility] # [phone]* (retail)       [address]       [place], [place]  [address]       Ph: [phone]       Fax: [phone]   RxID:   [id number]

## 2010-03-30 NOTE — Procedures (Signed)
Summary: Colonoscopy  Patient: Ann Wagner Note: All result statuses are Final unless otherwise noted.  Tests: (1) Colonoscopy (COL)   COL Colonoscopy           DONE     [facility]     [address]     [place], [place]  [address]           COLONOSCOPY PROCEDURE REPORT           PATIENT:  Ann Wagner, Ann Wagner  MR#:  [id number]     BIRTHDATE:  October 29, 1945, 63 yrs. old  GENDER:  female           ENDOSCOPIST:  Barbette Hair. Arlyce Dice, MD     Referred by:           PROCEDURE DATE:  08/25/2009     PROCEDURE:  Colonoscopy with snare polypectomy     ASA CLASS:  Class II     INDICATIONS:  1) Routine Risk Screening           MEDICATIONS:   Fentanyl 75 mcg IV, Versed 8 mg IV           DESCRIPTION OF PROCEDURE:   After the risks benefits and     alternatives of the procedure were thoroughly explained, informed     consent was obtained.  Digital rectal exam was performed and     revealed small external hemorrhoids.   The LB [id number]     endoscope was introduced through the anus and advanced to the     cecum, which was identified by both the appendix and ileocecal     valve, without limitations.  The quality of the prep was     excellent, using MoviPrep.  The instrument was then slowly     withdrawn as the colon was fully examined.     <<PROCEDUREIMAGES>>           FINDINGS:  A diminutive polyp was found in the descending colon.     It was 2 mm in size. Polyp was snared without cautery. Retrieval     was successful (see image9). snare polyp  A sessile polyp was     found in the rectum. It was 3 mm in size. It was found 2 cm from     the point of entry. Polyp was snared without cautery. Retrieval     was successful (see image14). snare polyp  Moderate diverticulosis     was found in the sigmoid colon (see image13).  This was otherwise     a normal examination of the colon (see image2, image3, image4,     image6, image8, image11, and image15).   Retroflexed views in the     rectum  revealed no abnormalities.    The time to cecum =  4.25     minutes. The scope was then withdrawn (time =  8.25  min) from the     patient and the procedure completed.           COMPLICATIONS:  None           ENDOSCOPIC IMPRESSION:     1) 2 mm diminutive polyp in the descending colon     2) 3 mm sessile polyp in the rectum     3) Moderate diverticulosis in the sigmoid colon     4) Otherwise normal examination     RECOMMENDATIONS:     1) If the polyp(s) removed today are proven to be adenomatous     (  pre-cancerous) polyps, you will need a repeat colonoscopy in 5     years. Otherwise you should continue to follow colorectal cancer     screening guidelines for "routine risk" patients with colonoscopy     in 10 years.           REPEAT EXAM:   You will receive a letter from Dr. Arlyce Dice in 1-2     weeks, after reviewing the final pathology, with followup     recommendations.           ______________________________     Barbette Hair Arlyce Dice, MD           CC: Ann Hatch, MD           n.     [name] at 08/25/2009 11:58 AM           Page 2 of 3   Ann Wagner, [id number]  Note: An exclamation mark (!) indicates a result that was not dispersed into the flowsheet. Document Creation Date: 08/25/2009 11:58 AM _______________________________________________________________________  (1) Order result status: Final Collection or observation date-time: 08/25/2009 11:49 Requested date-time:  Receipt date-time:  Reported date-time:  Referring Physician:   Ordering Physician: Melvia Heaps [phone]) Specimen Source:  Source: [name] Order Number: [id number] Lab site:   Appended Document: Colonoscopy     Procedures Next Due Date:    Colonoscopy: 08/2019

## 2010-03-30 NOTE — Letter (Signed)
    at [facility] [address] [place], [place]  [address] Phone: [phone]      Jul 16, 2009   [facility] [address] [place], [place] [address]  RE:  LAB RESULTS  Dear  Ms. Acord,  The following is an interpretation of your most recent lab tests.  Please take note of any instructions provided or changes to medications that have resulted from your lab work.  Pap Smear: normal

## 2010-04-01 NOTE — Progress Notes (Signed)
Summary: refill  Phone Note Refill Request Message from:  Fax from Pharmacy on February 16, 2010 4:53 PM  Refills Requested: Medication #1:  PAROXETINE HCL 10 MG TABS take one tablet daily  Medication #2:  LISINOPRIL 40 MG TABS take one tablet daily  Medication #3:  HYDROCHLOROTHIAZIDE 25 MG TABS take one tablet daily  Medication #4:  LEVOTHYROXINE SODIUM 50 MCG TABS take one tablet daily [facility] fax [phone]  Initial call taken by: Okey Regal Spring,  February 16, 2010 4:53 PM    Prescriptions: PAROXETINE HCL 10 MG TABS (PAROXETINE HCL) take one tablet daily  #30 x 3   Entered by:   Doristine Devoid CMA   Authorized by:   Neena Rhymes MD   Signed by:   Doristine Devoid CMA on 02/17/2010   Method used:   Electronically to        [facility] # [phone]* (retail)       [address]       [place], [place]  [address]       Ph: [phone]       Fax: [phone]   RxID:   [id number] HYDROCHLOROTHIAZIDE 25 MG TABS (HYDROCHLOROTHIAZIDE) take one tablet daily  #30 x 3   Entered by:   Doristine Devoid CMA   Authorized by:   Neena Rhymes MD   Signed by:   Doristine Devoid CMA on 02/17/2010   Method used:   Electronically to        [facility] # [phone]* (retail)       [address]       [place], [place]  [address]       Ph: [phone]       Fax: [phone]   RxID:   [id number] LISINOPRIL 40 MG TABS (LISINOPRIL) take one tablet daily  #30 x 3   Entered by:   Doristine Devoid CMA   Authorized by:   Neena Rhymes MD   Signed by:   Doristine Devoid CMA on 02/17/2010   Method used:   Electronically to        [facility] # [phone]* (retail)       [address]       [place], [place]  [address]       Ph: [phone]       Fax: [phone]   RxID:   [id number] LEVOTHYROXINE SODIUM 50 MCG TABS (LEVOTHYROXINE SODIUM) take one tablet daily  #30 x 3   Entered by:   Doristine Devoid CMA   Authorized by:   Neena Rhymes MD   Signed by:   Doristine Devoid CMA  on 02/17/2010   Method used:   Electronically to        [facility] # [phone]* (retail)       [address]       [place], [place]  [address]       Ph: [phone]       Fax: [phone]   RxID:   [id number]

## 2010-04-07 NOTE — Progress Notes (Signed)
Summary: DRUG CHANGE REQUEST  Phone Note Other Incoming   Caller: [facility] [address] Summary of Call: ACCORDING TO CLINICAL PHARMACOLOGY PT SHOULD NOT BE TAKING MORE THATN SIMVASTATIN 20MG  WHILE ON NORVASC 10MG . PLEASE ADVISE. THANKS. [phone] Initial call taken by: Lavell Islam,  March 29, 2010 8:47 AM  Follow-up for Phone Call        pt has been on this combo for over 12 month- has had normal cardiac w/u.  per the warning since she has been on this w/out problem for over a year there is no need to change.  certainly will not make changes w/out pts approval. Follow-up by: Neena Rhymes MD,  March 29, 2010 2:02 PM  Additional Follow-up for Phone Call Additional follow up Details #1::        left detailed information on pharmacy voicemail ...Marland KitchenMarland KitchenDoristine Devoid CMA  March 29, 2010 2:31 PM

## 2010-04-27 ENCOUNTER — Telehealth (INDEPENDENT_AMBULATORY_CARE_PROVIDER_SITE_OTHER): Payer: Self-pay | Admitting: *Deleted

## 2010-05-06 NOTE — Progress Notes (Signed)
Summary: refill  Phone Note Refill Request Message from:  Fax from Pharmacy on April 27, 2010 1:51 PM  Refills Requested: Medication #1:  SIMVASTATIN 40 MG TABS take one tablet daily  Medication #2:  AMLODIPINE BESYLATE 10 MG TABS take one tablet daily [facility] - fax [phone]  Initial call taken by: Okey Regal Spring,  April 27, 2010 1:51 PM    Prescriptions: SIMVASTATIN 40 MG TABS (SIMVASTATIN) take one tablet daily  #30 x 2   Entered by:   Doristine Devoid CMA   Authorized by:   Neena Rhymes MD   Signed by:   Doristine Devoid CMA on 04/28/2010   Method used:   Electronically to        [facility] # [phone]* (retail)       [address]       [place], [place]  [address]       Ph: [phone]       Fax: [phone]   RxID:   [id number] AMLODIPINE BESYLATE 10 MG TABS (AMLODIPINE BESYLATE) take one tablet daily  #30 x 2   Entered by:   Doristine Devoid CMA   Authorized by:   Neena Rhymes MD   Signed by:   Doristine Devoid CMA on 04/28/2010   Method used:   Electronically to        [facility] # [phone]* (retail)       [address]       [place], [place]  [address]       Ph: [phone]       Fax: [phone]   RxID:   [id number]

## 2010-06-23 ENCOUNTER — Other Ambulatory Visit: Payer: Self-pay | Admitting: *Deleted

## 2010-06-23 MED ORDER — PAROXETINE HCL 10 MG PO TABS: 10.0000 mg | ORAL_TABLET | Freq: Every day | ORAL | Status: DC

## 2010-06-23 MED ORDER — HYDROCHLOROTHIAZIDE 25 MG PO TABS: 25.0000 mg | ORAL_TABLET | Freq: Every day | ORAL | Status: DC

## 2010-06-23 MED ORDER — LEVOTHYROXINE SODIUM 25 MCG PO TABS: 25.0000 ug | ORAL_TABLET | Freq: Every day | ORAL | Status: DC

## 2010-06-23 MED ORDER — LISINOPRIL 40 MG PO TABS: 40.0000 mg | ORAL_TABLET | Freq: Every day | ORAL | Status: DC

## 2010-07-06 ENCOUNTER — Encounter: Payer: Self-pay | Admitting: Family Medicine

## 2010-07-20 ENCOUNTER — Other Ambulatory Visit: Payer: Self-pay | Admitting: *Deleted

## 2010-07-20 MED ORDER — HYDROCORTISONE 5 MG PO TABS: 5.0000 mg | ORAL_TABLET | Freq: Every day | ORAL | Status: DC

## 2010-08-23 ENCOUNTER — Other Ambulatory Visit: Payer: Self-pay | Admitting: Family Medicine

## 2010-08-23 MED ORDER — AMLODIPINE BESYLATE 10 MG PO TABS: 10.0000 mg | ORAL_TABLET | Freq: Every day | ORAL | Status: DC

## 2010-08-23 MED ORDER — SIMVASTATIN 40 MG PO TABS: 40.0000 mg | ORAL_TABLET | Freq: Every day | ORAL | Status: DC

## 2010-08-23 NOTE — Telephone Encounter (Signed)
Refills sent. Also mailed letter for CPX.

## 2010-08-31 ENCOUNTER — Other Ambulatory Visit: Payer: Self-pay | Admitting: Family Medicine

## 2010-08-31 MED ORDER — PAROXETINE HCL 10 MG PO TABS: 10.0000 mg | ORAL_TABLET | Freq: Every day | ORAL | Status: DC

## 2010-08-31 MED ORDER — LISINOPRIL 40 MG PO TABS: 40.0000 mg | ORAL_TABLET | Freq: Every day | ORAL | Status: DC

## 2010-08-31 MED ORDER — HYDROCHLOROTHIAZIDE 25 MG PO TABS: 25.0000 mg | ORAL_TABLET | Freq: Every day | ORAL | Status: DC

## 2010-08-31 MED ORDER — LEVOTHYROXINE SODIUM 25 MCG PO TABS: 25.0000 ug | ORAL_TABLET | Freq: Every day | ORAL | Status: DC

## 2010-08-31 MED ORDER — HYDROCORTISONE 5 MG PO TABS: 5.0000 mg | ORAL_TABLET | Freq: Every day | ORAL | Status: DC

## 2010-08-31 NOTE — Telephone Encounter (Signed)
Refill sent.

## 2010-08-31 NOTE — Telephone Encounter (Signed)
Addended by: Lucious Groves I on: 08/31/2010 02:47 PM   Modules accepted: Orders

## 2010-09-03 ENCOUNTER — Telehealth: Payer: Self-pay | Admitting: Family Medicine

## 2010-09-03 NOTE — Telephone Encounter (Signed)
Spoke w/ pt informed her that we have her still on and nothing has been changed on our end and that it must just be a change in manufacture. Pt ok'd information.

## 2010-09-28 ENCOUNTER — Telehealth: Payer: Self-pay | Admitting: Family Medicine

## 2010-09-29 MED ORDER — AMLODIPINE BESYLATE 10 MG PO TABS: 10.0000 mg | ORAL_TABLET | Freq: Every day | ORAL | Status: DC

## 2010-09-29 MED ORDER — SIMVASTATIN 40 MG PO TABS: 40.0000 mg | ORAL_TABLET | Freq: Every day | ORAL | Status: DC

## 2010-09-29 NOTE — Telephone Encounter (Signed)
Ok to refill meds x3 months

## 2010-09-29 NOTE — Telephone Encounter (Signed)
Rx sent to pharmacy   

## 2010-10-05 ENCOUNTER — Other Ambulatory Visit: Payer: Self-pay | Admitting: Family Medicine

## 2010-10-05 MED ORDER — AMLODIPINE BESYLATE 10 MG PO TABS: 10.0000 mg | ORAL_TABLET | Freq: Every day | ORAL | Status: DC

## 2010-10-05 MED ORDER — HYDROCHLOROTHIAZIDE 25 MG PO TABS: 25.0000 mg | ORAL_TABLET | Freq: Every day | ORAL | Status: DC

## 2010-10-05 MED ORDER — LEVOTHYROXINE SODIUM 25 MCG PO TABS: 25.0000 ug | ORAL_TABLET | Freq: Every day | ORAL | Status: DC

## 2010-10-05 MED ORDER — PAROXETINE HCL 10 MG PO TABS: 10.0000 mg | ORAL_TABLET | Freq: Every day | ORAL | Status: DC

## 2010-10-05 MED ORDER — HYDROCORTISONE 5 MG PO TABS: 5.0000 mg | ORAL_TABLET | Freq: Every day | ORAL | Status: DC

## 2010-10-05 MED ORDER — LISINOPRIL 40 MG PO TABS: 40.0000 mg | ORAL_TABLET | Freq: Every day | ORAL | Status: DC

## 2010-10-05 NOTE — Telephone Encounter (Signed)
Ok to refill all x3

## 2010-10-13 ENCOUNTER — Encounter: Payer: Self-pay | Admitting: Family Medicine

## 2010-10-13 ENCOUNTER — Ambulatory Visit (INDEPENDENT_AMBULATORY_CARE_PROVIDER_SITE_OTHER): Payer: Medicare Other | Admitting: Family Medicine

## 2010-10-13 DIAGNOSIS — Z0000 Encounter for general adult medical examination without abnormal findings: Secondary | ICD-10-CM

## 2010-10-13 DIAGNOSIS — E785 Hyperlipidemia, unspecified: Secondary | ICD-10-CM

## 2010-10-13 DIAGNOSIS — I10 Essential (primary) hypertension: Secondary | ICD-10-CM

## 2010-10-13 DIAGNOSIS — E039 Hypothyroidism, unspecified: Secondary | ICD-10-CM

## 2010-10-13 LAB — CBC WITH DIFFERENTIAL/PLATELET
Basophils Absolute: 0 10*3/uL (ref 0.0–0.1)
Basophils Relative: 0.5 % (ref 0.0–3.0)
Eosinophils Absolute: 0.1 10*3/uL (ref 0.0–0.7)
Eosinophils Relative: 1.4 % (ref 0.0–5.0)
HCT: 42.1 % (ref 36.0–46.0)
Hemoglobin: 14.2 g/dL (ref 12.0–15.0)
Lymphs Abs: 2.3 10*3/uL (ref 0.7–4.0)
Monocytes Absolute: 0.7 10*3/uL (ref 0.1–1.0)
Monocytes Relative: 7.4 % (ref 3.0–12.0)
Neutro Abs: 6.5 10*3/uL (ref 1.4–7.7)
Neutrophils Relative %: 67.2 % (ref 43.0–77.0)
Platelets: 254 10*3/uL (ref 150.0–400.0)
RBC: 4.66 Mil/uL (ref 3.87–5.11)
RDW: 13.1 % (ref 11.5–14.6)
WBC: 9.7 10*3/uL (ref 4.5–10.5)

## 2010-10-13 LAB — HEPATIC FUNCTION PANEL
ALT: 47 U/L — ABNORMAL HIGH (ref 0–35)
AST: 35 U/L (ref 0–37)
Albumin: 4.6 g/dL (ref 3.5–5.2)
Alkaline Phosphatase: 89 U/L (ref 39–117)
Bilirubin, Direct: 0 mg/dL (ref 0.0–0.3)
Total Bilirubin: 0.3 mg/dL (ref 0.3–1.2)
Total Protein: 7.3 g/dL (ref 6.0–8.3)

## 2010-10-13 LAB — LIPID PANEL
Cholesterol: 166 mg/dL (ref 0–200)
HDL: 51.3 mg/dL (ref >39.00)
LDL Cholesterol: 88 mg/dL (ref 0–99)
Triglycerides: 132 mg/dL (ref 0.0–149.0)
VLDL: 26.4 mg/dL (ref 0.0–40.0)

## 2010-10-13 LAB — BASIC METABOLIC PANEL
BUN: 25 mg/dL — ABNORMAL HIGH (ref 6–23)
CO2: 31 mEq/L (ref 19–32)
Calcium: 9.8 mg/dL (ref 8.4–10.5)
Creatinine, Ser: 0.9 mg/dL (ref 0.4–1.2)
Glucose, Bld: 116 mg/dL — ABNORMAL HIGH (ref 70–99)
Potassium: 4.6 mEq/L (ref 3.5–5.1)
Sodium: 141 mEq/L (ref 135–145)

## 2010-10-13 MED ORDER — ZOSTER VACCINE LIVE 19400 UNT/0.65ML ~~LOC~~ SOLR: 0.6500 mL | Freq: Once | SUBCUTANEOUS | Status: DC

## 2010-10-13 NOTE — Progress Notes (Signed)
  Subjective:    Patient ID: Ann Wagner, female    DOB: 1945-06-28, 65 y.o.   MRN: [id number]  HPI Here today for CPE.  Risk Factors: HTN- chronic problem, excellent control on Norvasc, HCTZ, lisinopril.  Attempting to limit salt.  No SOB, HAs, visual changes.  Intermittent edema due to heat.  Occasional CP- only notices when she is over tired, no problems w/ exertion.  Had normal cath ~3 yrs ago. Hyperlipidemia- chronic problem, on Zocor.  No abd pain, N/V, myalgias. Hypothyroid- chronic problem, asymptomatic.  On levothyroid.  Physical Activity:  Swimming in the mornings, walking. Fall Risk: not at risk Depression: pt denies sxs Hearing: normal to whispered voice at 6 ft ADL's: independent Cognitive: normal linear thought process, no deficits in memory or concentration noted. Home Safety: safe at home Height, Weight, BMI, Visual Acuity: see vitals, vision corrected to 20/20 w/ glasses Counseling: plans on scheduling mammo, no hx of abnormal paps- is on Q3 yr plan (not due until 2014 or if sxs arise), UTD on colonoscopy (2011) Labs Ordered: See A&P Care Plan: See A&P    Review of Systems Patient reports no vision/ hearing changes, adenopathy,fever, weight change,  persistant/recurrent hoarseness , swallowing issues, palpitations, persistant/recurrent cough, hemoptysis, dyspnea (rest/exertional/paroxysmal nocturnal), gastrointestinal bleeding (melena, rectal bleeding), abdominal pain, significant heartburn, bowel changes, GU symptoms (dysuria, hematuria, incontinence), Gyn symptoms (abnormal  bleeding, pain),  syncope, focal weakness, memory loss, numbness & tingling, skin/hair/nail changes, abnormal bruising or bleeding, anxiety, or depression.     Objective:   Physical Exam  General Appearance:    Alert, cooperative, no distress, appears stated age  Head:    Normocephalic, without obvious abnormality, atraumatic  Eyes:    PERRL, conjunctiva/corneas clear, EOM's intact, fundi   benign, both eyes  Ears:    Normal TM's and external ear canals, both ears  Nose:   Nares normal, septum midline, mucosa normal, no drainage    or sinus tenderness  Throat:   Lips, mucosa, and tongue normal; teeth and gums normal  Neck:   Supple, symmetrical, trachea midline, no adenopathy;    Thyroid: no enlargement/tenderness/nodules  Back:     Symmetric, no curvature, ROM normal, no CVA tenderness  Lungs:     Clear to auscultation bilaterally, respirations unlabored  Chest Wall:    No tenderness or deformity   Heart:    Regular rate and rhythm, S1 and S2 normal, no murmur, rub   or gallop  Breast Exam:    No tenderness, masses, or nipple abnormality  Abdomen:     Soft, non-tender, bowel sounds active all four quadrants,    no masses, no organomegaly  Genitalia:    Deferred at this visit  Rectal:    Extremities:   Extremities normal, atraumatic, no cyanosis or edema  Pulses:   2+ and symmetric all extremities  Skin:   Skin color, texture, turgor normal, no rashes or lesions  Lymph nodes:   Cervical, supraclavicular, and axillary nodes normal  Neurologic:   CNII-XII intact, normal strength, sensation and reflexes    throughout          Assessment & Plan:

## 2010-10-13 NOTE — Patient Instructions (Signed)
Follow up in 6 months to recheck cholesterol Go to [address] to get your chest xray- we'll call you with those results We'll notify you of your lab results Keep up the good work on diet and exercise Call with any questions or concerns Enjoy the wedding!

## 2010-10-14 ENCOUNTER — Telehealth: Payer: Self-pay

## 2010-10-14 NOTE — Telephone Encounter (Signed)
Message copied by Beverely Low on Thu Oct 14, 2010 11:58 AM ------      Message from: Sheliah Hatch      Created: Wed Oct 13, 2010  4:56 PM       Labs look good but glucose is elevated.  She should focus on healthy diet and regular exercise to avoid progression to diabetes

## 2010-10-14 NOTE — Telephone Encounter (Signed)
Pt.notified

## 2010-10-19 NOTE — Assessment & Plan Note (Signed)
Check labs and adjust meds prn. 

## 2010-10-19 NOTE — Assessment & Plan Note (Signed)
At goal.  Asymptomatic.  No changes.

## 2010-10-19 NOTE — Assessment & Plan Note (Signed)
Check labs.  Adjust meds prn  

## 2010-10-19 NOTE — Assessment & Plan Note (Signed)
Pt's PE WNL.  UTD on health maintenance.  Check labs.  Anticipatory guidance provided.  Get CXR as part of welcome to medicare physical

## 2010-11-10 ENCOUNTER — Other Ambulatory Visit: Payer: Self-pay | Admitting: Family Medicine

## 2010-11-10 MED ORDER — LISINOPRIL 40 MG PO TABS: 40.0000 mg | ORAL_TABLET | Freq: Every day | ORAL | Status: DC

## 2010-11-10 MED ORDER — PAROXETINE HCL 10 MG PO TABS: 10.0000 mg | ORAL_TABLET | Freq: Every day | ORAL | Status: DC

## 2010-11-10 MED ORDER — HYDROCHLOROTHIAZIDE 25 MG PO TABS: 25.0000 mg | ORAL_TABLET | Freq: Every day | ORAL | Status: DC

## 2010-11-10 NOTE — Telephone Encounter (Signed)
Rx sent 

## 2010-12-27 ENCOUNTER — Encounter: Payer: Self-pay | Admitting: Family Medicine

## 2010-12-27 ENCOUNTER — Ambulatory Visit (INDEPENDENT_AMBULATORY_CARE_PROVIDER_SITE_OTHER): Payer: Medicare Other | Admitting: Family Medicine

## 2010-12-27 VITALS — BP 122/72 | HR 61 | Temp 98.3°F | Ht 66.0 in | Wt 188.8 lb

## 2010-12-27 DIAGNOSIS — J329 Chronic sinusitis, unspecified: Secondary | ICD-10-CM

## 2010-12-27 MED ORDER — AZITHROMYCIN 250 MG PO TABS: 250.0000 mg | ORAL_TABLET | Freq: Every day | ORAL | Status: AC

## 2010-12-27 MED ORDER — BENZONATATE 200 MG PO CAPS: 200.0000 mg | ORAL_CAPSULE | Freq: Three times a day (TID) | ORAL | Status: DC | PRN

## 2010-12-27 NOTE — Assessment & Plan Note (Signed)
Pt's sxs consistent w/ early infxn.  Pt reports Zpack typically works well for her.  Start abx, cough meds prn.  Reviewed supportive care and red flags that should prompt return.  Pt expressed understanding and is in agreement w/ plan.

## 2010-12-27 NOTE — Progress Notes (Signed)
  Subjective:    Patient ID: Ann Wagner, female    DOB: 1945/06/25, 65 y.o.   MRN: [id number]  HPI URI- 'sinus drip thing'.  sxs started Friday.  Having sinus pressure, PND, increased cough.  No ear pain.  No fevers.  + sick contacts.  Hx of similar 'it usually goes into my chest'.   Review of Systems For ROS see HPI     Objective:   Physical Exam  Vitals reviewed. Constitutional: She appears well-developed and well-nourished. No distress.  HENT:  Head: Normocephalic and atraumatic.  Right Ear: Tympanic membrane normal.  Left Ear: Tympanic membrane normal.  Nose: Mucosal edema and rhinorrhea present. Right sinus exhibits maxillary sinus tenderness and frontal sinus tenderness. Left sinus exhibits maxillary sinus tenderness and frontal sinus tenderness.  Mouth/Throat: Uvula is midline and mucous membranes are normal. Posterior oropharyngeal erythema present. No oropharyngeal exudate.  Eyes: Conjunctivae and EOM are normal. Pupils are equal, round, and reactive to light.  Neck: Normal range of motion. Neck supple.  Cardiovascular: Normal rate, regular rhythm and normal heart sounds.   Pulmonary/Chest: Effort normal and breath sounds normal. No respiratory distress. She has no wheezes.  Lymphadenopathy:    She has no cervical adenopathy.          Assessment & Plan:

## 2010-12-27 NOTE — Patient Instructions (Signed)
Take the Azithromycin as directed Use the Tessalon as needed for cough Mucinex to thin your congestion Drink plenty of fluids REST! Hang in there!!!

## 2011-01-11 ENCOUNTER — Other Ambulatory Visit: Payer: Self-pay | Admitting: Family Medicine

## 2011-01-11 MED ORDER — SIMVASTATIN 40 MG PO TABS: 40.0000 mg | ORAL_TABLET | Freq: Every day | ORAL | Status: DC

## 2011-01-11 NOTE — Telephone Encounter (Signed)
rx sent to pharmacy by e-script For zocor  

## 2011-02-02 ENCOUNTER — Ambulatory Visit (INDEPENDENT_AMBULATORY_CARE_PROVIDER_SITE_OTHER): Payer: Medicare Other | Admitting: Family Medicine

## 2011-02-02 ENCOUNTER — Encounter: Payer: Self-pay | Admitting: Family Medicine

## 2011-02-02 VITALS — BP 120/80 | HR 59 | Temp 98.0°F | Ht 65.0 in | Wt 187.4 lb

## 2011-02-02 DIAGNOSIS — Z136 Encounter for screening for cardiovascular disorders: Secondary | ICD-10-CM

## 2011-02-02 DIAGNOSIS — J309 Allergic rhinitis, unspecified: Secondary | ICD-10-CM

## 2011-02-02 NOTE — Patient Instructions (Signed)
This appears to be all allergy related Start the nasal steroid- 2 sprays each nostril daily Add Claritin or Zyrtec daily Restart Mucinex to thin your congestion Drink plenty of fluids Call with any questions or concerns Hang in there! Happy Holidays!

## 2011-02-02 NOTE — Progress Notes (Signed)
  Subjective:    Patient ID: Ann Wagner, female    DOB: Jun 05, 1945, 65 y.o.   MRN: [id number]  HPI Was here end of October, finished Zpack- 'it worked'.  Went to [place] and 'i got it full blast'.  Went to [facility] 2 weeks ago and was given another Zpack.  Things again seemed to improve.  Cough is persisting, ears hurt, denies facial pain but has 'foggy feeling'.  No fevers.  + sick contacts.  Last week cough was very productive but now is mostly dry.  Occuring at night and early morning.  + PND.  Was previously taking Claritin but not currently.   Review of Systems For ROS see HPI     Objective:   Physical Exam  Vitals reviewed. Constitutional: She appears well-developed and well-nourished. No distress.  HENT:  Head: Normocephalic and atraumatic.  Right Ear: Tympanic membrane normal.  Left Ear: Tympanic membrane normal.  Nose: Mucosal edema and rhinorrhea present. Right sinus exhibits no maxillary sinus tenderness and no frontal sinus tenderness. Left sinus exhibits no maxillary sinus tenderness and no frontal sinus tenderness.  Mouth/Throat: Mucous membranes are normal. Posterior oropharyngeal erythema (w/ PND) present.  Eyes: Conjunctivae and EOM are normal. Pupils are equal, round, and reactive to light.  Neck: Normal range of motion. Neck supple.  Cardiovascular: Normal rate, regular rhythm and normal heart sounds.   Pulmonary/Chest: Effort normal and breath sounds normal. No respiratory distress. She has no wheezes. She has no rales.  Lymphadenopathy:    She has no cervical adenopathy.          Assessment & Plan:

## 2011-02-06 NOTE — Assessment & Plan Note (Signed)
Pt's sxs appear to be allergy related.  No evidence of bacterial infxn.  Start nasal steroid and OTC antihistamine. Reviewed supportive care and red flags that should prompt return.  Pt expressed understanding and is in agreement w/ plan.

## 2011-02-14 ENCOUNTER — Other Ambulatory Visit: Payer: Self-pay | Admitting: Family Medicine

## 2011-02-14 MED ORDER — AMLODIPINE BESYLATE 10 MG PO TABS: 10.0000 mg | ORAL_TABLET | Freq: Every day | ORAL | Status: DC

## 2011-02-14 MED ORDER — SIMVASTATIN 40 MG PO TABS: 40.0000 mg | ORAL_TABLET | Freq: Every day | ORAL | Status: DC

## 2011-02-14 NOTE — Telephone Encounter (Signed)
rx sent to pharmacy by e-script  

## 2011-03-15 ENCOUNTER — Other Ambulatory Visit: Payer: Self-pay | Admitting: Family Medicine

## 2011-03-15 MED ORDER — LEVOTHYROXINE SODIUM 25 MCG PO TABS: 25.0000 ug | ORAL_TABLET | Freq: Every day | ORAL | Status: DC

## 2011-03-15 NOTE — Telephone Encounter (Signed)
rx sent to pharmacy by e-script  

## 2011-04-15 ENCOUNTER — Telehealth: Payer: Self-pay | Admitting: Family Medicine

## 2011-04-15 MED ORDER — LISINOPRIL 40 MG PO TABS: 40.0000 mg | ORAL_TABLET | Freq: Every day | ORAL | Status: DC

## 2011-04-15 MED ORDER — PAROXETINE HCL 10 MG PO TABS: 10.0000 mg | ORAL_TABLET | Freq: Every day | ORAL | Status: DC

## 2011-04-15 MED ORDER — HYDROCHLOROTHIAZIDE 25 MG PO TABS: 25.0000 mg | ORAL_TABLET | Freq: Every day | ORAL | Status: DC

## 2011-04-15 NOTE — Telephone Encounter (Signed)
Refill: Paroxetine 10mg  tablet. Take 1 tablet by mouth daily. Qty 30. Last fill 02-13-11

## 2011-04-15 NOTE — Telephone Encounter (Signed)
Refill: hydrochlorothiazide 25mg  tablets. Take 1 tablet by mouth daily. Qty 30. Last refill 02-13-11

## 2011-04-15 NOTE — Telephone Encounter (Signed)
rx sent to pharmacy by e-script for #30 per pt due for CPE Letter has been mailed to pt address noted in the chart to advise they are overdue for cpe/ov/labs and the pt needs to contact office to set up appt  Spoke to scheduler to advise that the Pharmacy just sent in another refill request for pt lisinopril, noted I would also send #30 with no refills.noted via escribe

## 2011-05-10 ENCOUNTER — Telehealth: Payer: Self-pay | Admitting: Family Medicine

## 2011-05-10 NOTE — Telephone Encounter (Signed)
Refills for   1-Hydrochlorothiazide 25MG  tablets Qty 30 Take 1 tablet by mouth daily  Last fill date 04/15/11  2-Lisinopril 40MG  tablets  Qty 30 Take 1 tablet by mouth daily Last fill date 2.15.13  3-Paroxetine 10MG  tablets Qty 30 Take 1-tablet by mouth daily Last fill date 2.15.13

## 2011-05-11 MED ORDER — LISINOPRIL 40 MG PO TABS: 40.0000 mg | ORAL_TABLET | Freq: Every day | ORAL | Status: DC

## 2011-05-11 MED ORDER — HYDROCHLOROTHIAZIDE 25 MG PO TABS: 25.0000 mg | ORAL_TABLET | Freq: Every day | ORAL | Status: DC

## 2011-05-11 MED ORDER — PAROXETINE HCL 10 MG PO TABS: 10.0000 mg | ORAL_TABLET | Freq: Every day | ORAL | Status: DC

## 2011-05-11 NOTE — Telephone Encounter (Signed)
rx sent to pharmacy by e-script Per pt notes to have an upcoming apt on 06-20-11

## 2011-06-20 ENCOUNTER — Ambulatory Visit (INDEPENDENT_AMBULATORY_CARE_PROVIDER_SITE_OTHER): Payer: Medicare Other | Admitting: Family Medicine

## 2011-06-20 ENCOUNTER — Other Ambulatory Visit (HOSPITAL_COMMUNITY)
Admission: RE | Admit: 2011-06-20 | Discharge: 2011-06-20 | Disposition: A | Discharge status: Home / Self Care | Payer: Medicare Other | Source: Ambulatory Visit | Attending: Family Medicine | Admitting: Family Medicine

## 2011-06-20 ENCOUNTER — Encounter: Payer: Self-pay | Admitting: Family Medicine

## 2011-06-20 ENCOUNTER — Telehealth: Payer: Self-pay | Admitting: Family Medicine

## 2011-06-20 VITALS — BP 120/78 | HR 56 | Temp 98.0°F | Ht 65.75 in | Wt 182.0 lb

## 2011-06-20 DIAGNOSIS — E559 Vitamin D deficiency, unspecified: Secondary | ICD-10-CM

## 2011-06-20 DIAGNOSIS — E785 Hyperlipidemia, unspecified: Secondary | ICD-10-CM

## 2011-06-20 DIAGNOSIS — I10 Essential (primary) hypertension: Secondary | ICD-10-CM

## 2011-06-20 DIAGNOSIS — E039 Hypothyroidism, unspecified: Secondary | ICD-10-CM

## 2011-06-20 DIAGNOSIS — Z0000 Encounter for general adult medical examination without abnormal findings: Secondary | ICD-10-CM

## 2011-06-20 DIAGNOSIS — Z1231 Encounter for screening mammogram for malignant neoplasm of breast: Secondary | ICD-10-CM

## 2011-06-20 DIAGNOSIS — Z124 Encounter for screening for malignant neoplasm of cervix: Secondary | ICD-10-CM

## 2011-06-20 DIAGNOSIS — Z01419 Encounter for gynecological examination (general) (routine) without abnormal findings: Secondary | ICD-10-CM | POA: Insufficient documentation

## 2011-06-20 DIAGNOSIS — Z780 Asymptomatic menopausal state: Secondary | ICD-10-CM

## 2011-06-20 LAB — HEPATIC FUNCTION PANEL
ALT: 28 U/L (ref 0–35)
Bilirubin, Direct: 0 mg/dL (ref 0.0–0.3)
Total Bilirubin: 0.6 mg/dL (ref 0.3–1.2)

## 2011-06-20 LAB — CBC WITH DIFFERENTIAL/PLATELET
Basophils Absolute: 0.1 10*3/uL (ref 0.0–0.1)
Eosinophils Absolute: 0.1 10*3/uL (ref 0.0–0.7)
Hemoglobin: 14.2 g/dL (ref 12.0–15.0)
Lymphocytes Relative: 33 % (ref 12.0–46.0)
MCHC: 33.9 g/dL (ref 30.0–36.0)
MCV: 89.4 fl (ref 78.0–100.0)
Monocytes Absolute: 0.6 10*3/uL (ref 0.1–1.0)
Neutro Abs: 4 10*3/uL (ref 1.4–7.7)
Neutrophils Relative %: 55.5 % (ref 43.0–77.0)
RDW: 12.9 % (ref 11.5–14.6)

## 2011-06-20 LAB — BASIC METABOLIC PANEL
Chloride: 105 mEq/L (ref 96–112)
Creatinine, Ser: 0.7 mg/dL (ref 0.4–1.2)
GFR: 87.39 mL/min (ref >60.00)

## 2011-06-20 LAB — LIPID PANEL
LDL Cholesterol: 76 mg/dL (ref 0–99)
Total CHOL/HDL Ratio: 3

## 2011-06-20 NOTE — Patient Instructions (Signed)
You look great!  Keep up the good work! Follow up in 6 months to recheck cholesterol, BP, and thyroid We'll notify you of your lab results Someone will call you with your mammo and bone density Call with any questions or concerns Happy Spring!

## 2011-06-20 NOTE — Telephone Encounter (Signed)
Refill: Amlodipine Besylate 10mg  tab. Take 1 tablet by mouth daily. Qty 30. Last fill 05-10-11 Simvastatin 40mg  tab. Take 1 tablet by mouth at bedtime. Qty 30. Last fill 05-10-11 Hydrochlorothiazide 25mg  tab. Take 1 tablet by mouth daily. Qty 30. Last fill 05-11-11 Paroxetine 10mg  tab. Take 1 tablet by mouth daily. Qty 30. Last fill 05-11-11 Levothyroxine 0.025mg  tab. Take 1 tablet by mouth every day. Qty 30.Marland Kitchen Last fill 05-10-11 Lisinopril 40mg  tablets. Take 1 tablet by mouth daily. Qty 30. Last fill 05-11-11

## 2011-06-20 NOTE — Progress Notes (Signed)
  Subjective:    Patient ID: Ann Wagner, female    DOB: 12-Feb-1946, 66 y.o.   MRN: [id number]  HPI Here today for CPE.  Risk Factors: HTN- chronic problem, on Lisinopril, HCTZ, Norvasc.  Well controlled today.  Denies CP, SOB, HAs, visual changes, edema. Hyperlipidemia- chronic problem, on Zocor.  Denies abd pain, N/V, myalgias. Hypothyroid- chronic problem, on Synthroid  Physical Activity: walking regularly Fall Risk: low risk Depression: chronic problem, on Paxil, sxs well controlled Hearing: normal to whispered voice at 6 ft ADL's: independent Cognitive: normal linear thought process, memory and attention intact Home Safety: safe at home Height, Weight, BMI, Visual Acuity: see vitals, vision corrected to 20/20 w/ glasses Counseling: UTD on colonoscopy, needs mammo and DEXA. Labs Ordered: See A&P Care Plan: See A&P    Review of Systems Patient reports no vision/ hearing changes, adenopathy, fever, weight change,  persistant/recurrent hoarseness , swallowing issues, chest pain, palpitations, edema, persistant/recurrent cough, hemoptysis, dyspnea (rest/exertional/paroxysmal nocturnal), gastrointestinal bleeding (melena, rectal bleeding), abdominal pain, significant heartburn, bowel changes, GU symptoms (dysuria, hematuria, incontinence), Gyn symptoms (abnormal  bleeding, pain),  syncope, focal weakness, memory loss, numbness & tingling, skin/hair/nail changes, abnormal bruising or bleeding, anxiety, or depression.     Objective:   Physical Exam  General Appearance:    Alert, cooperative, no distress, appears stated age  Head:    Normocephalic, without obvious abnormality, atraumatic  Eyes:    PERRL, conjunctiva/corneas clear, EOM's intact, fundi    benign, both eyes  Ears:    Normal TM's and external ear canals, both ears  Nose:   Nares normal, septum midline, mucosa normal, no drainage    or sinus tenderness  Throat:   Lips, mucosa, and tongue normal; teeth and gums normal    Neck:   Supple, symmetrical, trachea midline, no adenopathy;    Thyroid: no enlargement/tenderness/nodules  Back:     Symmetric, no curvature, ROM normal, no CVA tenderness  Lungs:     Clear to auscultation bilaterally, respirations unlabored  Chest Wall:    No tenderness or deformity   Heart:    Regular rate and rhythm, S1 and S2 normal, no murmur, rub   or gallop  Breast Exam:    No tenderness, masses, or nipple abnormality  Abdomen:     Soft, non-tender, bowel sounds active all four quadrants,    no masses, no organomegaly  Genitalia:    External genitalia normal, cervix normal in appearance, no CMT, uterus in normal size and position, adnexa w/out mass or tenderness, mucosa pink and moist, no lesions or discharge present  Rectal:    Normal external appearance  Extremities:   Extremities normal, atraumatic, no cyanosis or edema  Pulses:   2+ and symmetric all extremities  Skin:   Skin color, texture, turgor normal, no rashes or lesions  Lymph nodes:   Cervical, supraclavicular, and axillary nodes normal  Neurologic:   CNII-XII intact, normal strength, sensation and reflexes    throughout          Assessment & Plan:

## 2011-06-21 ENCOUNTER — Encounter: Payer: Self-pay | Admitting: *Deleted

## 2011-06-21 MED ORDER — LISINOPRIL 40 MG PO TABS: 40.0000 mg | ORAL_TABLET | Freq: Every day | ORAL | Status: DC

## 2011-06-21 MED ORDER — LEVOTHYROXINE SODIUM 25 MCG PO TABS: 25.0000 ug | ORAL_TABLET | Freq: Every day | ORAL | Status: DC

## 2011-06-21 MED ORDER — SIMVASTATIN 40 MG PO TABS: 40.0000 mg | ORAL_TABLET | Freq: Every day | ORAL | Status: DC

## 2011-06-21 MED ORDER — HYDROCHLOROTHIAZIDE 25 MG PO TABS: 25.0000 mg | ORAL_TABLET | Freq: Every day | ORAL | Status: DC

## 2011-06-21 MED ORDER — AMLODIPINE BESYLATE 10 MG PO TABS: 10.0000 mg | ORAL_TABLET | Freq: Every day | ORAL | Status: DC

## 2011-06-21 NOTE — Telephone Encounter (Signed)
All requested medications sent in per pt has had recent CPE

## 2011-06-22 MED ORDER — PAROXETINE HCL 10 MG PO TABS: 10.0000 mg | ORAL_TABLET | Freq: Every day | ORAL | Status: DC

## 2011-06-22 NOTE — Telephone Encounter (Signed)
Addended by: Derry Lory A on: 06/22/2011 02:35 PM   Modules accepted: Orders

## 2011-06-22 NOTE — Telephone Encounter (Signed)
[facility] faxed & stated they did not get the refill for  Paroxetine 10MG  Tablets  Take 1-tablet by mouth saily  Qty 30 Last filled 03.13.2013 Can you please re-send  Fax# [phone]

## 2011-06-22 NOTE — Telephone Encounter (Signed)
fax recv. requesting multiple refills on Paroxetine 10MG  Tablets Can you re-send again with refills  [facility] Fax [phone]

## 2011-06-22 NOTE — Telephone Encounter (Signed)
Faxed medication in for #90 with 3 refills per MD [name] verbal instructions

## 2011-06-23 LAB — VITAMIN D 1,25 DIHYDROXY
Vitamin D2 1, 25 (OH)2: <8 pg/mL
Vitamin D3 1, 25 (OH)2: 32 pg/mL

## 2011-06-27 ENCOUNTER — Encounter: Payer: Self-pay | Admitting: *Deleted

## 2011-07-05 NOTE — Assessment & Plan Note (Signed)
Chronic problem.  Well controlled.  Asymptomatic.  No changes. 

## 2011-07-05 NOTE — Assessment & Plan Note (Signed)
Pt's PE WNL.  UTD on colonoscopy.  Refer for mammo and DEXA.  Check labs.   Anticipatory guidance provided.

## 2011-07-05 NOTE — Assessment & Plan Note (Signed)
Chronic problem.  Tolerating statin w/out difficulty.  Check labs.  Adjust meds prn  

## 2011-07-05 NOTE — Assessment & Plan Note (Signed)
Pap collected. 

## 2011-07-05 NOTE — Assessment & Plan Note (Signed)
Chronic problem.  On synthroid.  Check labs.  Adjust meds prn

## 2011-07-05 NOTE — Assessment & Plan Note (Signed)
Check labs.  Replete prn. 

## 2011-07-27 ENCOUNTER — Ambulatory Visit: Payer: Medicare Other

## 2011-07-27 ENCOUNTER — Other Ambulatory Visit: Payer: Medicare Other

## 2011-08-12 ENCOUNTER — Ambulatory Visit
Admission: RE | Admit: 2011-08-12 | Discharge: 2011-08-12 | Disposition: A | Discharge status: Home / Self Care | Payer: Medicare Other | Source: Ambulatory Visit | Attending: Family Medicine | Admitting: Family Medicine

## 2011-08-12 ENCOUNTER — Other Ambulatory Visit: Payer: Medicare Other

## 2011-08-12 DIAGNOSIS — Z1231 Encounter for screening mammogram for malignant neoplasm of breast: Secondary | ICD-10-CM

## 2011-09-06 ENCOUNTER — Other Ambulatory Visit: Payer: Medicare Other

## 2011-12-13 ENCOUNTER — Encounter: Payer: Self-pay | Admitting: Internal Medicine

## 2011-12-13 ENCOUNTER — Ambulatory Visit (INDEPENDENT_AMBULATORY_CARE_PROVIDER_SITE_OTHER)
Admission: RE | Admit: 2011-12-13 | Discharge: 2011-12-13 | Disposition: A | Discharge status: Home / Self Care | Payer: Medicare Other | Source: Ambulatory Visit | Attending: Internal Medicine | Admitting: Internal Medicine

## 2011-12-13 ENCOUNTER — Ambulatory Visit (INDEPENDENT_AMBULATORY_CARE_PROVIDER_SITE_OTHER): Payer: Medicare Other | Admitting: Internal Medicine

## 2011-12-13 VITALS — BP 126/70 | HR 60 | Temp 97.7°F | Wt 180.0 lb

## 2011-12-13 DIAGNOSIS — M546 Pain in thoracic spine: Secondary | ICD-10-CM

## 2011-12-13 DIAGNOSIS — R079 Chest pain, unspecified: Secondary | ICD-10-CM

## 2011-12-13 DIAGNOSIS — M549 Dorsalgia, unspecified: Secondary | ICD-10-CM

## 2011-12-13 MED ORDER — CYCLOBENZAPRINE HCL 10 MG PO TABS: 10.0000 mg | ORAL_TABLET | Freq: Two times a day (BID) | ORAL | Status: DC | PRN

## 2011-12-13 MED ORDER — IBUPROFEN 800 MG PO TABS: 800.0000 mg | ORAL_TABLET | Freq: Three times a day (TID) | ORAL | Status: DC | PRN

## 2011-12-13 NOTE — Progress Notes (Signed)
  Subjective:    Patient ID: Ann Wagner, female    DOB: 1945/09/22, 66 y.o.   MRN: [id number]  HPI Acute visit 4-5 days history of pain at the right upper back that involves also the right armpit and somehow goes to the front into the chest. The pain was on and off, now for the  last 24 hours is more steady. Described as sharp, intense. Change   little with deep breaths or moving the shoulders. Has taken ibuprofen 800 mg with mild relief.  Past Medical History  Diagnosis Date  . Arthritis   . History of chicken pox   . Allergic rhinitis   . GERD (gastroesophageal reflux disease)   . Hyperlipidemia   . Hyperthyroidism   . Syncope    Past Surgical History  Procedure Date  . Tubal ligation      Review of Systems No fever or chills No shortness of breath, recent airplane trips or prolonged car trips, no lower extremity edema. No rash No headaches, no myalgias per se but her joints in general are more achy lately, she thinks related to DJD: Shoulders, elbows, neck. No nausea, vomiting, diarrhea.no abd pain.     Objective:   Physical Exam  Musculoskeletal:       Arms:  General -- alert, well-developed, and well-nourished.   Neck --no supraclavicular mass, no LADs Lungs -- normal respiratory effort, no intercostal retractions, no accessory muscle use, and normal breath sounds.   Heart-- normal rate, regular rhythm, no murmur, and no gallop.  No rub. Anterior chest wall without pain. No rash at the chest or upper back Armpit, slightly sensitive to palpation on the right but no mass. Normal at the left. Abdomen--soft, non-tender, no distention, no masses, no HSM, no guarding, and no rigidity.   Extremities-- no pretibial edema bilaterally psych-- Cognition and judgment appear intact. Alert and cooperative with normal attention span and concentration.  not anxious appearing and not depressed appearing.        Assessment & Plan:  Upper back, atypical chest pain. Differential  diagnosis includes a musculoskeletal issue, pleurisy, pneumonia; other more serious conditions like a PE - CAD are less likely given lack of risk factors and sx description. Plan: Chest x-ray Pain control with ibuprofen as she is doing (GI s/e discussed), Flexeril; declined vicodin for now. If not better in the next 3-4days, patient is advised to call me, will make further eval.

## 2011-12-13 NOTE — Patient Instructions (Addendum)
Please get your x-ray at the other [facility]  office located at: [address] [place], across from [facility].  Please go to the basement, this is a walk-in facility, they are open from 8:30 to 5:30 PM. Phone number [phone]. --- Rest, use a heating pad. Ibuprofen 3 times a day as needed, use only for a few days, always take with food. It may upset your stomach. If that is the case to stop and let us know. Flexeril as a muscle relaxant, will cause drowsiness. Be cautious. If no better in few days or if you get worse, having a rash, have severe pain, shortness of breath ---> let us know right away

## 2011-12-14 ENCOUNTER — Other Ambulatory Visit: Payer: Self-pay | Admitting: Family Medicine

## 2011-12-14 NOTE — Telephone Encounter (Signed)
refills x 2 last ov 10.15.13--acute  1-Simvastatin (Tab) 40 MG Take 1 tablet (40 mg total) by mouth at bedtime. #30--last fill 8.12.13  2-AmLODIPine Besylate (Tab) 10 MG Take 1 tablet (10 mg total) by mouth daily. #30 last fill 8.12.13

## 2011-12-15 MED ORDER — SIMVASTATIN 40 MG PO TABS: 40.0000 mg | ORAL_TABLET | Freq: Every day | ORAL | Status: DC

## 2011-12-15 MED ORDER — AMLODIPINE BESYLATE 10 MG PO TABS: 10.0000 mg | ORAL_TABLET | Freq: Every day | ORAL | Status: DC

## 2011-12-15 NOTE — Telephone Encounter (Signed)
Refills sent #30 x no refills as pt is due for follow up in October. Please call pt to arrange fasting appt.

## 2011-12-16 ENCOUNTER — Telehealth: Payer: Self-pay | Admitting: Family Medicine

## 2011-12-16 MED ORDER — HYDROCODONE-ACETAMINOPHEN 5-500 MG PO TABS: 1.0000 | ORAL_TABLET | ORAL | Status: DC | PRN

## 2011-12-16 NOTE — Telephone Encounter (Signed)
Pt denies any fever, cough or CP, states her sxs were actually all muscle related, advised about medication to be sent to pharmacy, faxed

## 2011-12-16 NOTE — Telephone Encounter (Signed)
Ok for Vicodin 5/500, 1 tab q4-6 prn pain, #30. Please ask pt if she has fever, cough, CP b/c according to Dr Drue Novel she would need further eval if pain didn't improve w/ NSAID

## 2011-12-16 NOTE — Telephone Encounter (Signed)
Please advise notations about medication request

## 2011-12-16 NOTE — Telephone Encounter (Signed)
Coming in for this on 10.22.13-at 915am

## 2011-12-16 NOTE — Telephone Encounter (Signed)
Sorry clicked closed encounter button Notes copied from 10.15.13 Assessment & Plan:   Upper back, atypical chest pain.  Differential diagnosis includes a musculoskeletal issue, pleurisy, pneumonia; other more serious conditions like a PE - CAD are less likely given lack of risk factors and sx description.  Plan:  Chest x-ray  Pain control with ibuprofen as she is doing (GI s/e discussed), Flexeril; declined vicodin for now.  If not better in the next 3-4days, patient is advised to call me, will make further eval.  Per pt uses [facility] [address]  [facility] - [place], [place] - [address] AT [facility] [address] [facility] [address] Cb# [phone]

## 2011-12-16 NOTE — Telephone Encounter (Signed)
called pt to get f/u appt scheduled pt noted problem issue from 10.15.13 visit with paz Pt noted medications rx at visit are no longer working and PAZ stated he could give her something stronger at her visit the other day but she declined. Pt now would like to get that medication to see if it will help. Per ov notes below pt was offered Vicodin

## 2011-12-16 NOTE — Addendum Note (Signed)
Addended by: Derry Lory A on: 12/16/2011 03:54 PM   Modules accepted: Orders

## 2011-12-19 ENCOUNTER — Telehealth: Payer: Self-pay | Admitting: Family Medicine

## 2011-12-19 NOTE — Telephone Encounter (Signed)
Caller: Jan/Patient; Patient Name: Ann Wagner; PCP: Sheliah Hatch.; Best Callback Phone Number: [phone] Reason for Call: seen in office last week for muscle spasm/pain and sx not improved so prescribed Hydrocodone which has helped. Noticed slight red bumpy rash on 12/16/11 on R side under arm and redness extends to back by shoulder blade and in the front under breast. Feels burning, tingling and is numb in some spots. She thinks it is probably shingles and is wondering if there is any kind of cream that can be applied topically to help. Triage and Care advice per Skin Lesions Protocol and appointment advised within 24 hours for "burning or tingling feeling, itchiness or stabbing pain in a localized area on one side of the body followed by reddened fluid filled blisters that break and crust." She is already scheduled to come in for Physical on 12/20/11. Advised to try Hydrocortisone Cream TID and Caladryl Lotion PRN and HS.

## 2011-12-19 NOTE — Telephone Encounter (Signed)
Spoke to pt to advise results/instructions. Pt understood. Pt will see at office tomorrow

## 2011-12-19 NOTE — Telephone Encounter (Signed)
.  left message to have patient return my call.  

## 2011-12-19 NOTE — Telephone Encounter (Signed)
Will see rash in office tomorrow- but if truly shingles, there is nothing topical to apply w/ exception of what was already advised (although this may not work)

## 2011-12-20 ENCOUNTER — Telehealth: Payer: Self-pay

## 2011-12-20 ENCOUNTER — Encounter: Payer: Self-pay | Admitting: Family Medicine

## 2011-12-20 ENCOUNTER — Ambulatory Visit (INDEPENDENT_AMBULATORY_CARE_PROVIDER_SITE_OTHER): Payer: Medicare Other | Admitting: Family Medicine

## 2011-12-20 VITALS — BP 120/87 | HR 65 | Temp 98.2°F | Ht 65.5 in | Wt 183.0 lb

## 2011-12-20 DIAGNOSIS — I10 Essential (primary) hypertension: Secondary | ICD-10-CM

## 2011-12-20 DIAGNOSIS — E785 Hyperlipidemia, unspecified: Secondary | ICD-10-CM

## 2011-12-20 DIAGNOSIS — E039 Hypothyroidism, unspecified: Secondary | ICD-10-CM

## 2011-12-20 DIAGNOSIS — B029 Zoster without complications: Secondary | ICD-10-CM | POA: Insufficient documentation

## 2011-12-20 LAB — HEPATIC FUNCTION PANEL
Albumin: 3.9 g/dL (ref 3.5–5.2)
Total Protein: 7 g/dL (ref 6.0–8.3)

## 2011-12-20 LAB — BASIC METABOLIC PANEL
CO2: 32 mEq/L (ref 19–32)
Calcium: 9.7 mg/dL (ref 8.4–10.5)
Creatinine, Ser: 0.9 mg/dL (ref 0.4–1.2)
Glucose, Bld: 93 mg/dL (ref 70–99)

## 2011-12-20 LAB — LDL CHOLESTEROL, DIRECT: Direct LDL: 92.9 mg/dL

## 2011-12-20 LAB — LIPID PANEL
Cholesterol: 161 mg/dL (ref 0–200)
Triglycerides: 258 mg/dL — ABNORMAL HIGH (ref 0.0–149.0)

## 2011-12-20 MED ORDER — LISINOPRIL 40 MG PO TABS: 40.0000 mg | ORAL_TABLET | Freq: Every day | ORAL | Status: DC

## 2011-12-20 MED ORDER — HYDROCODONE-ACETAMINOPHEN 5-500 MG PO TABS: 1.0000 | ORAL_TABLET | ORAL | Status: DC | PRN

## 2011-12-20 MED ORDER — AMLODIPINE BESYLATE 10 MG PO TABS: 10.0000 mg | ORAL_TABLET | Freq: Every day | ORAL | Status: DC

## 2011-12-20 MED ORDER — LEVOTHYROXINE SODIUM 25 MCG PO TABS: 25.0000 ug | ORAL_TABLET | Freq: Every day | ORAL | Status: DC

## 2011-12-20 MED ORDER — HYDROCHLOROTHIAZIDE 25 MG PO TABS: 25.0000 mg | ORAL_TABLET | Freq: Every day | ORAL | Status: DC

## 2011-12-20 MED ORDER — SIMVASTATIN 40 MG PO TABS: 40.0000 mg | ORAL_TABLET | Freq: Every day | ORAL | Status: DC

## 2011-12-20 MED ORDER — VALACYCLOVIR HCL 1 G PO TABS: 1000.0000 mg | ORAL_TABLET | Freq: Three times a day (TID) | ORAL | Status: DC

## 2011-12-20 MED ORDER — PAROXETINE HCL 10 MG PO TABS: 10.0000 mg | ORAL_TABLET | Freq: Every day | ORAL | Status: DC

## 2011-12-20 MED ORDER — CYCLOBENZAPRINE HCL 10 MG PO TABS: 10.0000 mg | ORAL_TABLET | Freq: Two times a day (BID) | ORAL | Status: DC | PRN

## 2011-12-20 NOTE — Telephone Encounter (Signed)
PA for cyclobenzaprine approved until 12/19/12. Ref # [id number]       KP

## 2011-12-20 NOTE — Assessment & Plan Note (Signed)
New.  Rash consistent w/ dx.  Likely the source of her pain from last week.  Start Valtrex.  Pt expressed understanding and is in agreement w/ plan.

## 2011-12-20 NOTE — Assessment & Plan Note (Signed)
Chronic problem, tolerating meds w/out difficulty.  Check labs.  Adjust meds prn  

## 2011-12-20 NOTE — Patient Instructions (Addendum)
Schedule your complete physical for April We'll notify you of your lab results and make any changes if needed Start the Valtrex 3x/day for 7 days for the shingles Continue the Vicodin for pain Call with any questions or concerns Hang in there!!!

## 2011-12-20 NOTE — Assessment & Plan Note (Signed)
Chronic problem.  On levothyroxine.  Check labs.  Adjust meds prn

## 2011-12-20 NOTE — Assessment & Plan Note (Signed)
Chronic problem, well controlled on current meds.  Asymptomatic.  No changes. 

## 2011-12-20 NOTE — Progress Notes (Signed)
  Subjective:    Patient ID: Ann Wagner, female    DOB: [birth year], 66 y.o.   MRN: [id number]  HPI HTN- chronic problem, on Norvasc, HCTZ, lisinopril.  Well controlled today.  No CP, SOB, HAs, visual changes, edema.  Hyperlipidemia- chronic problem, on Zocor nightly.  Denies abd pain, N/V. Myalgias.  Hypothyroid- chronic problem, on levothyroxine.  Due for labs.  Denies heat/cold intolerance, dry or brittle skin/hair/nails.  Rash- was seen 1 week ago for shoulder pain but no rash was present.  Ibuprofen was initially controlling pain but by end of last week pain had worsened.  Called in Vicodin.  Friday night noticed red, burning rash under R arm.  Had shingles shot last year.   Review of Systems     Objective:   Physical Exam  Vitals reviewed. Constitutional: She is oriented to person, place, and time. She appears well-developed and well-nourished. No distress.  HENT:  Head: Normocephalic and atraumatic.  Eyes: Conjunctivae normal and EOM are normal. Pupils are equal, round, and reactive to light.  Neck: Normal range of motion. Neck supple. No thyromegaly present.  Cardiovascular: Normal rate, regular rhythm, normal heart sounds and intact distal pulses.   No murmur heard. Pulmonary/Chest: Effort normal and breath sounds normal. No respiratory distress.  Abdominal: Soft. She exhibits no distension. There is no tenderness.  Musculoskeletal: She exhibits no edema.  Lymphadenopathy:    She has no cervical adenopathy.  Neurological: She is alert and oriented to person, place, and time.  Skin: Skin is warm and dry. Rash (vesicular rash in R axilla consistent w/ shingles) noted.  Psychiatric: She has a normal mood and affect. Her behavior is normal.          Assessment & Plan:

## 2011-12-21 ENCOUNTER — Other Ambulatory Visit: Payer: Self-pay | Admitting: Family Medicine

## 2011-12-21 NOTE — Telephone Encounter (Signed)
Pt called concerning hydrocodone Rx. Pt states pharmacy state they never received Rx request. I advised pt I would call it in. I called pharmacy and gave a verbal order since [name] had already approved it. Rx done pt aware.         Called pt back after talking to [name]. Beverely Low stated gave pt a hand written Rx for Hydrocodone that's why all other Rx was at pharmacy ready and not that one. I called pt and left message for pt to shred written Rx that she received with OV paper work yesterday.  Pt called back advising me she doesn't have it.  MW

## 2011-12-21 NOTE — Telephone Encounter (Signed)
Pt states she needs you to call her back, the meds that were to be called in yesterday didn't get called and she needs them.

## 2011-12-21 NOTE — Telephone Encounter (Signed)
Called pt to advise all her medications were sent yesterday, pt advised that she received all the medications except for the vicodin and flexirill, noted printed, will discuss with MD [name] to verify if the RX was given to pt in hand during OV and call pt back, pt understood

## 2011-12-23 ENCOUNTER — Other Ambulatory Visit: Payer: Self-pay

## 2011-12-23 MED ORDER — LEVOTHYROXINE SODIUM 75 MCG PO TABS: 75.0000 ug | ORAL_TABLET | Freq: Every day | ORAL | Status: DC

## 2011-12-23 MED ORDER — LEVOTHYROXINE SODIUM 25 MCG PO TABS: 25.0000 ug | ORAL_TABLET | Freq: Every day | ORAL | Status: DC

## 2011-12-23 NOTE — Telephone Encounter (Signed)
Levothyroxine 75 mcg per lab results Rx sent pt aware.   MW

## 2012-03-09 ENCOUNTER — Encounter: Payer: Self-pay | Admitting: *Deleted

## 2012-03-09 ENCOUNTER — Ambulatory Visit (INDEPENDENT_AMBULATORY_CARE_PROVIDER_SITE_OTHER): Payer: Medicare Other | Admitting: Family Medicine

## 2012-03-09 ENCOUNTER — Encounter: Payer: Self-pay | Admitting: Family Medicine

## 2012-03-09 ENCOUNTER — Encounter: Payer: Self-pay | Admitting: Lab

## 2012-03-09 VITALS — BP 128/58 | HR 68 | Temp 98.1°F | Ht 65.25 in | Wt 184.2 lb

## 2012-03-09 DIAGNOSIS — M67919 Unspecified disorder of synovium and tendon, unspecified shoulder: Secondary | ICD-10-CM

## 2012-03-09 DIAGNOSIS — M719 Bursopathy, unspecified: Secondary | ICD-10-CM

## 2012-03-09 NOTE — Patient Instructions (Addendum)
Continue the ibuprofen regularly Use the muscle relaxer at night as needed Alternate heat/ice We'll call you with your ortho appt Hang in there! Happy New Year!!!

## 2012-03-09 NOTE — Progress Notes (Signed)
  Subjective:    Patient ID: Ann Wagner, female    DOB: August 25, 1945, 67 y.o.   MRN: [id number]  HPI Shoulder pain- bilateral but L > R.  Pain will shoot down arm and into hand.  Fingers 2,3,4 were numb last week.  sxs started in August w/ neck pain.  Pain started traveling into traps and shoulders.  Has been taking 800mg  ibuprofen w/ some relief.  Flexeril also provided some relief.  Unable to lie on it at night.  Some pain w/ overhead motion.   Review of Systems For ROS see HPI     Objective:   Physical Exam  Vitals reviewed. Constitutional: She appears well-developed and well-nourished. No distress.  Musculoskeletal:       Left shoulder: She exhibits decreased range of motion (limited forward flexion, abduction, internal rotation, + impingement signs), tenderness, pain and decreased strength. She exhibits no swelling, no effusion, no crepitus, no deformity, no spasm and normal pulse.          Assessment & Plan:

## 2012-03-09 NOTE — Assessment & Plan Note (Signed)
Pt w/ evidence of rotator cuff pathology.  Already on NSAIDs and muscle relaxers.  Refer to ortho.  Reviewed supportive care and red flags that should prompt return.  Pt expressed understanding and is in agreement w/ plan.

## 2012-04-04 ENCOUNTER — Telehealth: Payer: Self-pay | Admitting: Family Medicine

## 2012-04-04 NOTE — Telephone Encounter (Signed)
Patient Information:  Caller Name: Jan  Phone: [phone]  Patient: Ann Wagner, Ann Wagner  Gender: Female  DOB: [birth year]  Age: 67 Years  PCP: Sheliah Hatch.  Office Follow Up:  Does the office need to follow up with this patient?: Yes  Instructions For The Office: Needs to be seen per protocal.  No appts available.  Patient is requesting that antibiotics be called in without the visit, states it has been done in the past.   Symptoms  Reason For Call & Symptoms: Patient calling, has a sinus infection and now a productive cough/yellow in color.   Sx started on 2/2.  Reviewed Health History In EMR: Yes  Reviewed Medications In EMR: Yes  Reviewed Allergies In EMR: Yes  Reviewed Surgeries / Procedures: Yes  Date of Onset of Symptoms: 04/01/2012  Treatments Tried: Musinex, Motrin  Treatments Tried Worked: No  Guideline(s) Used:  Cough  Disposition Per Guideline:   See Today in Office  Reason For Disposition Reached:   Sinus pain persists after using nasal washes and pain medicine > 24 hours  Advice Given:  N/A

## 2012-04-04 NOTE — Telephone Encounter (Signed)
Discuss with patient, appt scheduled. 

## 2012-04-04 NOTE — Telephone Encounter (Signed)
Pt will need to schedule OV tomorrow- no abx w/out appt

## 2012-04-05 ENCOUNTER — Encounter: Payer: Self-pay | Admitting: Family Medicine

## 2012-04-05 ENCOUNTER — Ambulatory Visit (INDEPENDENT_AMBULATORY_CARE_PROVIDER_SITE_OTHER): Payer: Medicare Other | Admitting: Family Medicine

## 2012-04-05 VITALS — BP 128/58 | HR 62 | Temp 98.0°F | Ht 65.25 in | Wt 182.4 lb

## 2012-04-05 DIAGNOSIS — J329 Chronic sinusitis, unspecified: Secondary | ICD-10-CM

## 2012-04-05 MED ORDER — GUAIFENESIN-CODEINE 100-10 MG/5ML PO SYRP: 10.0000 mL | ORAL_SOLUTION | Freq: Three times a day (TID) | ORAL | Status: DC | PRN

## 2012-04-05 MED ORDER — AMOXICILLIN 875 MG PO TABS: 875.0000 mg | ORAL_TABLET | Freq: Two times a day (BID) | ORAL | Status: DC

## 2012-04-05 MED ORDER — IBUPROFEN 800 MG PO TABS: 800.0000 mg | ORAL_TABLET | Freq: Three times a day (TID) | ORAL | Status: DC | PRN

## 2012-04-05 NOTE — Progress Notes (Signed)
  Subjective:    Patient ID: Ann Wagner, female    DOB: 01-03-1946, 67 y.o.   MRN: [id number]  HPI Sinusitis- sxs started Sunday w/ nasal congestion, + frontal HA, mild facial pain, bilateral ear fullness.  Using saline, mucinex.  Cough was initially productive but now 'not going anywhere'.  No fevers.  No known sick contacts.  Hx of sinus infxn.   Review of Systems For ROS see HPI     Objective:   Physical Exam  Vitals reviewed. Constitutional: She appears well-developed and well-nourished. No distress.  HENT:  Head: Normocephalic and atraumatic.  Right Ear: Tympanic membrane normal.  Left Ear: Tympanic membrane normal.  Nose: Mucosal edema and rhinorrhea present. Right sinus exhibits maxillary sinus tenderness. Right sinus exhibits no frontal sinus tenderness. Left sinus exhibits maxillary sinus tenderness. Left sinus exhibits no frontal sinus tenderness.  Mouth/Throat: Uvula is midline and mucous membranes are normal. Posterior oropharyngeal erythema present. No oropharyngeal exudate.  Eyes: Conjunctivae normal and EOM are normal. Pupils are equal, round, and reactive to light.  Neck: Normal range of motion. Neck supple.  Cardiovascular: Normal rate, regular rhythm and normal heart sounds.   Pulmonary/Chest: Effort normal and breath sounds normal. No respiratory distress. She has no wheezes.  Lymphadenopathy:    She has no cervical adenopathy.          Assessment & Plan:

## 2012-04-05 NOTE — Patient Instructions (Addendum)
This is a maxillary sinus infection Start the Amox twice daily w/ food- this will also cover any bronchitis Drink plenty of fluids Use the cough syrup as needed- will make you drowsy Mucinex DM for daytime cough REST! Hang in there!

## 2012-04-05 NOTE — Assessment & Plan Note (Signed)
Pt's sxs and PE consistent w/ infxn.  Start abx, cough meds prn.  Reviewed supportive care and red flags that should prompt return.  Pt expressed understanding and is in agreement w/ plan.

## 2012-05-18 ENCOUNTER — Ambulatory Visit (INDEPENDENT_AMBULATORY_CARE_PROVIDER_SITE_OTHER): Payer: Medicare Other | Admitting: Family Medicine

## 2012-05-18 ENCOUNTER — Encounter: Payer: Self-pay | Admitting: Family Medicine

## 2012-05-18 VITALS — BP 110/60 | HR 68 | Temp 98.2°F | Ht 65.25 in | Wt 181.0 lb

## 2012-05-18 DIAGNOSIS — J309 Allergic rhinitis, unspecified: Secondary | ICD-10-CM

## 2012-05-18 DIAGNOSIS — L918 Other hypertrophic disorders of the skin: Secondary | ICD-10-CM

## 2012-05-18 DIAGNOSIS — E039 Hypothyroidism, unspecified: Secondary | ICD-10-CM

## 2012-05-18 DIAGNOSIS — R5383 Other fatigue: Secondary | ICD-10-CM

## 2012-05-18 DIAGNOSIS — L909 Atrophic disorder of skin, unspecified: Secondary | ICD-10-CM

## 2012-05-18 DIAGNOSIS — R5381 Other malaise: Secondary | ICD-10-CM

## 2012-05-18 LAB — CBC WITH DIFFERENTIAL/PLATELET
Basophils Absolute: 0 10*3/uL (ref 0.0–0.1)
Eosinophils Absolute: 0.1 10*3/uL (ref 0.0–0.7)
Lymphocytes Relative: 25.5 % (ref 12.0–46.0)
MCHC: 33.7 g/dL (ref 30.0–36.0)
Neutrophils Relative %: 61.3 % (ref 43.0–77.0)
RDW: 12.8 % (ref 11.5–14.6)

## 2012-05-18 LAB — BASIC METABOLIC PANEL
Chloride: 100 mEq/L (ref 96–112)
Potassium: 3.6 mEq/L (ref 3.5–5.1)
Sodium: 136 mEq/L (ref 135–145)

## 2012-05-18 NOTE — Progress Notes (Signed)
  Subjective:    Patient ID: Ann Wagner, female    DOB: 11/15/1945, 67 y.o.   MRN: [id number]  HPI Rash- under L armpit, 1st noticed this AM.  Not painful, sensitive to touch.  Fatigue- 'completely wiped out', sleeping 10 hrs/day and still exhausted.  Completed 2 rounds of abx for sinus infxn.  Still having dry cough, nasal congestion.  No facial pain/pressure.  Has been caring for elderly, ill aunt x3 weeks in [place].   Review of Systems     Objective:   Physical Exam  Vitals reviewed. Constitutional: She appears well-developed and well-nourished. No distress.  HENT:  Head: Normocephalic and atraumatic.  Right Ear: Tympanic membrane normal.  Left Ear: Tympanic membrane normal.  Nose: Mucosal edema and rhinorrhea present. Right sinus exhibits no maxillary sinus tenderness and no frontal sinus tenderness. Left sinus exhibits no maxillary sinus tenderness and no frontal sinus tenderness.  Mouth/Throat: Mucous membranes are normal. Posterior oropharyngeal erythema (w/ PND) present.  Eyes: Conjunctivae and EOM are normal. Pupils are equal, round, and reactive to light.  Neck: Normal range of motion. Neck supple. No thyromegaly present.  Cardiovascular: Normal rate, regular rhythm and normal heart sounds.   Pulmonary/Chest: Effort normal and breath sounds normal. No respiratory distress. She has no wheezes. She has no rales.  Abdominal: Soft. Bowel sounds are normal. She exhibits no distension. There is no tenderness. There is no rebound and no guarding.  Lymphadenopathy:    She has no cervical adenopathy.  Skin: Skin is warm and dry. No rash (irritated skin tags (x3) in L axilla) noted.          Assessment & Plan:

## 2012-05-18 NOTE — Assessment & Plan Note (Signed)
Deteriorated.  Strongly encouraged pt to resume her Claritin daily.  Pt expressed understanding and is in agreement w/ plan.

## 2012-05-18 NOTE — Assessment & Plan Note (Signed)
Chronic problem, last lab check showed abnormal TSH and dose was increased.  Repeat labs today due to fatigue and titrate dose prn.  Pt expressed understanding and is in agreement w/ plan.

## 2012-05-18 NOTE — Assessment & Plan Note (Signed)
New.  No evidence of infxn or rash.  Reviewed dx and benign nature.  Pt expressed understanding and is in agreement w/ plan.

## 2012-05-18 NOTE — Assessment & Plan Note (Signed)
New.  Suspect this is a combo of things- untreated nasal allergies, recent infxn, caregiver stress, travel, and hypothyroid.  Check labs.  Adjust meds prn.  Reviewed supportive care and red flags that should prompt return.  Pt expressed understanding and is in agreement w/ plan.

## 2012-05-18 NOTE — Patient Instructions (Addendum)
We'll notify you of your lab results and adjust your meds as needed REST! Restart your allergy meds to improve nasal congestion, post-nasal drip, and sore throat Drink plenty of fluids The area under your arm is an irritated skin tag and looks ok Call with any questions or concerns Hang in there!

## 2012-05-21 ENCOUNTER — Encounter: Payer: Self-pay | Admitting: Family Medicine

## 2012-06-20 ENCOUNTER — Telehealth: Payer: Self-pay | Admitting: Family Medicine

## 2012-06-20 MED ORDER — LISINOPRIL 40 MG PO TABS: 40.0000 mg | ORAL_TABLET | Freq: Every day | ORAL | Status: DC

## 2012-06-20 MED ORDER — HYDROCHLOROTHIAZIDE 25 MG PO TABS: 25.0000 mg | ORAL_TABLET | Freq: Every day | ORAL | Status: DC

## 2012-06-20 NOTE — Telephone Encounter (Signed)
LISINOPRIL 40MG  TABLETS QTY: 30 LAST: 3.26.14 TAKE 1 TABLET BY MOUTH DAILY

## 2012-06-20 NOTE — Telephone Encounter (Signed)
Meds filled

## 2012-06-20 NOTE — Telephone Encounter (Signed)
HYDROCHLOROTHIAZIDE 25MG  TABLETS QTY:30 LAST 3.26.14 TAKE 1 TABLET BY MOUTH DAILY

## 2012-07-25 ENCOUNTER — Other Ambulatory Visit: Payer: Self-pay | Admitting: General Practice

## 2012-07-25 NOTE — Telephone Encounter (Signed)
Amlodipine refill request. Pt last had meds filled on 07-20-12, #30 with 0 refills left. Pt not due for refill until 08-20-12.

## 2012-08-23 ENCOUNTER — Other Ambulatory Visit: Payer: Self-pay | Admitting: Family Medicine

## 2012-08-24 ENCOUNTER — Telehealth: Payer: Self-pay

## 2012-08-24 NOTE — Telephone Encounter (Signed)
Patient is due for CPE. Left message for call back to schedule. Will refill medications until appt.

## 2012-10-22 ENCOUNTER — Encounter: Payer: Medicare Other | Admitting: Family Medicine

## 2012-11-20 ENCOUNTER — Other Ambulatory Visit: Payer: Self-pay | Admitting: Family Medicine

## 2012-11-22 NOTE — Telephone Encounter (Signed)
Rx filled and sent to pharmacy.  

## 2012-12-03 ENCOUNTER — Telehealth: Payer: Self-pay

## 2012-12-03 NOTE — Telephone Encounter (Signed)
LM for CB  HM UTD WE: T-dap, PNA, flu

## 2012-12-04 ENCOUNTER — Encounter: Payer: Self-pay | Admitting: Family Medicine

## 2012-12-04 ENCOUNTER — Ambulatory Visit (INDEPENDENT_AMBULATORY_CARE_PROVIDER_SITE_OTHER): Payer: Medicare Other | Admitting: Family Medicine

## 2012-12-04 VITALS — BP 112/62 | HR 60 | Temp 98.2°F | Resp 16 | Ht 65.0 in | Wt 173.0 lb

## 2012-12-04 DIAGNOSIS — E785 Hyperlipidemia, unspecified: Secondary | ICD-10-CM

## 2012-12-04 DIAGNOSIS — I10 Essential (primary) hypertension: Secondary | ICD-10-CM

## 2012-12-04 DIAGNOSIS — E039 Hypothyroidism, unspecified: Secondary | ICD-10-CM

## 2012-12-04 DIAGNOSIS — G43109 Migraine with aura, not intractable, without status migrainosus: Secondary | ICD-10-CM

## 2012-12-04 DIAGNOSIS — E2839 Other primary ovarian failure: Secondary | ICD-10-CM

## 2012-12-04 DIAGNOSIS — E559 Vitamin D deficiency, unspecified: Secondary | ICD-10-CM

## 2012-12-04 DIAGNOSIS — Z0000 Encounter for general adult medical examination without abnormal findings: Secondary | ICD-10-CM

## 2012-12-04 DIAGNOSIS — Z1231 Encounter for screening mammogram for malignant neoplasm of breast: Secondary | ICD-10-CM

## 2012-12-04 LAB — CBC WITH DIFFERENTIAL/PLATELET
Basophils Relative: 0.7 % (ref 0.0–3.0)
Eosinophils Absolute: 0.1 10*3/uL (ref 0.0–0.7)
HCT: 40.3 % (ref 36.0–46.0)
Hemoglobin: 13.7 g/dL (ref 12.0–15.0)
Lymphs Abs: 2.5 10*3/uL (ref 0.7–4.0)
MCHC: 34 g/dL (ref 30.0–36.0)
MCV: 85.1 fl (ref 78.0–100.0)
Monocytes Absolute: 0.9 10*3/uL (ref 0.1–1.0)
Neutro Abs: 6.1 10*3/uL (ref 1.4–7.7)
RBC: 4.74 Mil/uL (ref 3.87–5.11)
RDW: 14.4 % (ref 11.5–14.6)

## 2012-12-04 LAB — HEPATIC FUNCTION PANEL
ALT: 20 U/L (ref 0–35)
Total Bilirubin: 0.7 mg/dL (ref 0.3–1.2)

## 2012-12-04 LAB — LIPID PANEL
Cholesterol: 140 mg/dL (ref 0–200)
HDL: 40.9 mg/dL (ref >39.00)
LDL Cholesterol: 75 mg/dL (ref 0–99)
Total CHOL/HDL Ratio: 3
Triglycerides: 123 mg/dL (ref 0.0–149.0)
VLDL: 24.6 mg/dL (ref 0.0–40.0)

## 2012-12-04 LAB — BASIC METABOLIC PANEL
BUN: 16 mg/dL (ref 6–23)
Calcium: 9.5 mg/dL (ref 8.4–10.5)
Chloride: 104 mEq/L (ref 96–112)
Creatinine, Ser: 0.8 mg/dL (ref 0.4–1.2)
GFR: 74.73 mL/min (ref >60.00)

## 2012-12-04 LAB — TSH: TSH: 1.3 u[IU]/mL (ref 0.35–5.50)

## 2012-12-04 NOTE — Assessment & Plan Note (Signed)
Check labs.  Replete prn. 

## 2012-12-04 NOTE — Assessment & Plan Note (Signed)
Chronic problem.  Well controlled.  Asymptomatic.  Check labs.  No anticipated changes. 

## 2012-12-04 NOTE — Assessment & Plan Note (Signed)
New to provider.  Has seen eye doctor.  Offered neuro referral but pt doesn't feel sxs are intrusive at this time.  Will continue to follow.

## 2012-12-04 NOTE — Patient Instructions (Addendum)
Follow up in 6 months to recheck BP, cholesterol- sooner if you need me We'll notify you of your lab results and make any changes if needed Keep up the good work!  You look great! We'll call you with your mammo and bone density appt Call with any questions or concerns Happy [birth year] Birthday!

## 2012-12-04 NOTE — Assessment & Plan Note (Signed)
Pt's PE WNL.  UTD on pap and colonoscopy.  Due for mammo and DEXA- ordered entered.  Check labs.  Anticipatory guidance provided.

## 2012-12-04 NOTE — Telephone Encounter (Signed)
Unable to reach prior to visit  

## 2012-12-04 NOTE — Assessment & Plan Note (Signed)
Chronic problem.  Tolerating statin w/out difficulty.  Check labs.  Adjust meds prn  

## 2012-12-04 NOTE — Progress Notes (Signed)
  Subjective:    Patient ID: Ann Wagner, female    DOB: [birth year], 68 y.o.   MRN: [id number]  HPI Here today for CPE.  Risk Factors: HTN- chronic problem, on amlodipine, HCTZ, lisinopril.  Denies CP, SOB, edema Hyperlipidemia- chronic problem, on Simvastatin.  Denies abd pain, N/V, myalgias. Hypothyroid- on levothyroxine daily.  Denies fatigue, constipation. 'Silent migraine'- pt reports more of a visual disturbance w/out progressing to painful HA.  Some pressure and discomfort.  Occuring infrequently.  Pt cannot identify a trigger.  Has seen eye doctor and exam was normal.  Physical Activity: walking regularly Fall Risk: low risk Depression: no current sxs Hearing: normal to conversational tones and whispered voice at 6 ft ADL's: independent Cognitive: normal linear thought process, memory and attention intact Home Safety: safe at home Height, Weight, BMI, Visual Acuity: see vitals, vision corrected to 20/20 w/ glasses Counseling: UTD on colonoscopy, pap.  Due for mammo and DEXA. Labs Ordered: See A&P Care Plan: See A&P   Review of Systems Patient reports no vision/ hearing changes, adenopathy,fever, weight change,  persistant/recurrent hoarseness , swallowing issues, chest pain, palpitations, edema, persistant/recurrent cough, hemoptysis, dyspnea (rest/exertional/paroxysmal nocturnal), gastrointestinal bleeding (melena, rectal bleeding), abdominal pain, significant heartburn, bowel changes, GU symptoms (dysuria, hematuria, incontinence), Gyn symptoms (abnormal  bleeding, pain),  syncope, focal weakness, memory loss, skin/hair/nail changes, abnormal bruising or bleeding, anxiety, or depression. + paresthesias of forearms bilaterally- positional    Objective:   Physical Exam General Appearance:    Alert, cooperative, no distress, appears stated age  Head:    Normocephalic, without obvious abnormality, atraumatic  Eyes:    PERRL, conjunctiva/corneas clear, EOM's intact, fundi   benign, both eyes  Ears:    Normal TM's and external ear canals, both ears  Nose:   Nares normal, septum midline, mucosa normal, no drainage    or sinus tenderness  Throat:   Lips, mucosa, and tongue normal; teeth and gums normal  Neck:   Supple, symmetrical, trachea midline, no adenopathy;    Thyroid: no enlargement/tenderness/nodules  Back:     Symmetric, no curvature, ROM normal, no CVA tenderness  Lungs:     Clear to auscultation bilaterally, respirations unlabored  Chest Wall:    No tenderness or deformity   Heart:    Regular rate and rhythm, S1 and S2 normal, no murmur, rub   or gallop  Breast Exam:    Deferred to mammo  Abdomen:     Soft, non-tender, bowel sounds active all four quadrants,    no masses, no organomegaly  Genitalia:    Deferred at pt's request  Rectal:    Extremities:   Extremities normal, atraumatic, no cyanosis or edema  Pulses:   2+ and symmetric all extremities  Skin:   Skin color, texture, turgor normal, no rashes or lesions  Lymph nodes:   Cervical, supraclavicular, and axillary nodes normal  Neurologic:   CNII-XII intact, normal strength, sensation and reflexes    throughout          Assessment & Plan:

## 2012-12-04 NOTE — Assessment & Plan Note (Signed)
Chronic problem.  Currently asymptomatic.  Check labs.  Adjust meds prn  

## 2012-12-05 ENCOUNTER — Encounter: Payer: Self-pay | Admitting: General Practice

## 2012-12-08 LAB — VITAMIN D 1,25 DIHYDROXY
Vitamin D 1, 25 (OH)2 Total: 37 pg/mL (ref 18–72)
Vitamin D2 1, 25 (OH)2: <8 pg/mL

## 2012-12-10 ENCOUNTER — Encounter: Payer: Self-pay | Admitting: General Practice

## 2012-12-14 ENCOUNTER — Ambulatory Visit (INDEPENDENT_AMBULATORY_CARE_PROVIDER_SITE_OTHER)
Admission: RE | Admit: 2012-12-14 | Discharge: 2012-12-14 | Disposition: A | Discharge status: Home / Self Care | Payer: Medicare Other | Source: Ambulatory Visit | Attending: Family Medicine | Admitting: Family Medicine

## 2012-12-14 DIAGNOSIS — E2839 Other primary ovarian failure: Secondary | ICD-10-CM

## 2012-12-19 ENCOUNTER — Ambulatory Visit
Admission: RE | Admit: 2012-12-19 | Discharge: 2012-12-19 | Disposition: A | Discharge status: Home / Self Care | Payer: Medicare Other | Source: Ambulatory Visit | Attending: Family Medicine | Admitting: Family Medicine

## 2012-12-19 DIAGNOSIS — Z1231 Encounter for screening mammogram for malignant neoplasm of breast: Secondary | ICD-10-CM

## 2012-12-22 ENCOUNTER — Other Ambulatory Visit: Payer: Self-pay | Admitting: Family Medicine

## 2012-12-22 NOTE — Telephone Encounter (Signed)
Med filled.  

## 2012-12-24 ENCOUNTER — Other Ambulatory Visit: Payer: Self-pay | Admitting: Family Medicine

## 2012-12-24 NOTE — Telephone Encounter (Signed)
Med filled.  

## 2012-12-29 ENCOUNTER — Other Ambulatory Visit: Payer: Self-pay | Admitting: Family Medicine

## 2012-12-31 NOTE — Telephone Encounter (Signed)
Med filled.  

## 2013-02-13 ENCOUNTER — Telehealth: Payer: Self-pay | Admitting: Family Medicine

## 2013-02-13 NOTE — Telephone Encounter (Signed)
Patient called and stated that she erased her bone density results on her voice mail. Patient wanted to see could we give it to her gain.

## 2013-03-06 ENCOUNTER — Ambulatory Visit (INDEPENDENT_AMBULATORY_CARE_PROVIDER_SITE_OTHER): Payer: Medicare Other | Admitting: Internal Medicine

## 2013-03-06 ENCOUNTER — Encounter: Payer: Self-pay | Admitting: Internal Medicine

## 2013-03-06 VITALS — BP 107/57 | HR 60 | Temp 98.4°F | Wt 177.0 lb

## 2013-03-06 DIAGNOSIS — J069 Acute upper respiratory infection, unspecified: Secondary | ICD-10-CM

## 2013-03-06 NOTE — Progress Notes (Signed)
Pre visit review using our clinic review tool, if applicable. No additional management support is needed unless otherwise documented below in the visit note. 

## 2013-03-06 NOTE — Progress Notes (Signed)
   Subjective:    Patient ID: Ann Wagner, female    DOB: 1946/02/27, 68 y.o.   MRN: [id number]  HPI Acute visit Symptoms started 2 days ago with sore throat, sinus congestion, dry cough. Using Mucinex and ibuprofen.   Past Medical History  Diagnosis Date  . Arthritis   . History of chicken pox   . Allergic rhinitis   . GERD (gastroesophageal reflux disease)   . Hyperlipidemia   . Hyperthyroidism   . Syncope    Past Surgical History  Procedure Laterality Date  . Tubal ligation      Review of Systems Denies any fever or chills. No achiness, mild drowsiness. Some sore throat. No nausea, vomiting, diarrhea.     Objective:   Physical Exam BP 107/57  Pulse 60  Temp(Src) 98.4 F (36.9 C)  Wt 177 lb (80.287 kg)  SpO2 100%  General -- alert, well-developed, NAD.   HEENT-- Not pale. TMs normal, throat symmetric, no redness or discharge. Face symmetric, sinuses not tender to palpation. Nose not congested. Lungs -- normal respiratory effort, no intercostal retractions, no accessory muscle use, and normal breath sounds.  Heart-- normal rate, regular rhythm, no murmur.  Extremities-- no pretibial edema bilaterally  Neurologic--  alert & oriented X3.   Psych-- Cognition and judgment appear intact. Cooperative with normal attention span and concentration. No anxious or depressed appearing.      Assessment & Plan:  URI, See instructions

## 2013-03-06 NOTE — Patient Instructions (Signed)
Rest, fluids , tylenol  For cough, take Mucinex DM twice a day as needed   For congestion use OTC Nasocort: 2 nasal sprays on each side of the nose daily until you feel better  You may also use pseudoephedrine 30 mg (behind the counter, you need to talk with the pharmacist):  one tablet 3 or 4 times a day as needed for congestion for no more than 5 days as it may raise your BP   Call if no better in few days  Call anytime if the symptoms are severe

## 2013-03-07 ENCOUNTER — Encounter: Payer: Self-pay | Admitting: Internal Medicine

## 2013-03-08 ENCOUNTER — Telehealth: Payer: Self-pay | Admitting: Family Medicine

## 2013-03-08 MED ORDER — AMOXICILLIN 500 MG PO TABS: 500.0000 mg | ORAL_TABLET | Freq: Two times a day (BID) | ORAL | Status: DC

## 2013-03-08 NOTE — Telephone Encounter (Signed)
Pt seen Paz on 03/06/13. Please advise on meds

## 2013-03-08 NOTE — Telephone Encounter (Signed)
Send him amoxicillin 500 mg 2 tablets twice a day for one week, #28, no refills

## 2013-03-08 NOTE — Addendum Note (Signed)
Addended by: Kris Hartmann on: 03/08/2013 03:30 PM   Modules accepted: Orders

## 2013-03-08 NOTE — Telephone Encounter (Signed)
Please advise 

## 2013-03-08 NOTE — Telephone Encounter (Signed)
Med filled and pt notified.  

## 2013-03-08 NOTE — Telephone Encounter (Signed)
Patient is calling to request an antibiotic for her sore throat and chest congestion. She had an appointment on Wednesday and states that she was given an rx for her sinuses and was told to call back if things did not get better. Patient states that things have not gotten better and would like an rx sent to [facility] on [address]. Please advise.

## 2013-03-14 NOTE — Telephone Encounter (Signed)
Late documentation- Patient was given bone density results and understood

## 2013-03-24 ENCOUNTER — Other Ambulatory Visit: Payer: Self-pay | Admitting: Family Medicine

## 2013-03-25 NOTE — Telephone Encounter (Signed)
Med filled.  

## 2013-04-15 ENCOUNTER — Other Ambulatory Visit: Payer: Self-pay | Admitting: Family Medicine

## 2013-04-17 NOTE — Telephone Encounter (Signed)
Med filled.  

## 2013-07-04 ENCOUNTER — Other Ambulatory Visit: Payer: Self-pay | Admitting: Family Medicine

## 2013-07-04 NOTE — Telephone Encounter (Signed)
Med filled and letter mailed to pt.  

## 2013-07-06 ENCOUNTER — Other Ambulatory Visit: Payer: Self-pay | Admitting: Family Medicine

## 2013-07-08 NOTE — Telephone Encounter (Signed)
Med filled and letter mailed to pt.  

## 2013-07-23 ENCOUNTER — Other Ambulatory Visit: Payer: Self-pay | Admitting: Family Medicine

## 2013-07-24 NOTE — Telephone Encounter (Signed)
Rx sent to the pharmacy by e-script.//AB/CMA 

## 2013-08-05 ENCOUNTER — Other Ambulatory Visit: Payer: Self-pay | Admitting: Family Medicine

## 2013-08-05 NOTE — Telephone Encounter (Signed)
Med filled and letter mailed to pt.  

## 2013-08-12 ENCOUNTER — Other Ambulatory Visit: Payer: Self-pay | Admitting: Family Medicine

## 2013-08-12 NOTE — Telephone Encounter (Signed)
Med filled.  

## 2013-08-20 ENCOUNTER — Ambulatory Visit (INDEPENDENT_AMBULATORY_CARE_PROVIDER_SITE_OTHER): Payer: Medicare Other | Admitting: Family Medicine

## 2013-08-20 ENCOUNTER — Encounter: Payer: Self-pay | Admitting: Family Medicine

## 2013-08-20 VITALS — BP 122/72 | HR 60 | Temp 98.1°F | Resp 16 | Wt 179.1 lb

## 2013-08-20 DIAGNOSIS — M722 Plantar fascial fibromatosis: Secondary | ICD-10-CM

## 2013-08-20 DIAGNOSIS — I10 Essential (primary) hypertension: Secondary | ICD-10-CM

## 2013-08-20 DIAGNOSIS — E785 Hyperlipidemia, unspecified: Secondary | ICD-10-CM

## 2013-08-20 LAB — BASIC METABOLIC PANEL
BUN: 20 mg/dL (ref 6–23)
CHLORIDE: 101 meq/L (ref 96–112)
CO2: 28 meq/L (ref 19–32)
CREATININE: 0.8 mg/dL (ref 0.4–1.2)
Calcium: 9.9 mg/dL (ref 8.4–10.5)
GFR: 71.51 mL/min (ref >60.00)
Glucose, Bld: 124 mg/dL — ABNORMAL HIGH (ref 70–99)
POTASSIUM: 4.1 meq/L (ref 3.5–5.1)
Sodium: 138 mEq/L (ref 135–145)

## 2013-08-20 LAB — LIPID PANEL
CHOL/HDL RATIO: 4
Cholesterol: 161 mg/dL (ref 0–200)
HDL: 40.6 mg/dL (ref >39.00)
LDL Cholesterol: 89 mg/dL (ref 0–99)
NONHDL: 120.4
Triglycerides: 158 mg/dL — ABNORMAL HIGH (ref 0.0–149.0)
VLDL: 31.6 mg/dL (ref 0.0–40.0)

## 2013-08-20 LAB — HEPATIC FUNCTION PANEL
ALT: 21 U/L (ref 0–35)
AST: 23 U/L (ref 0–37)
Albumin: 4.5 g/dL (ref 3.5–5.2)
Alkaline Phosphatase: 83 U/L (ref 39–117)
BILIRUBIN DIRECT: 0 mg/dL (ref 0.0–0.3)
Total Bilirubin: 0.7 mg/dL (ref 0.2–1.2)
Total Protein: 7.6 g/dL (ref 6.0–8.3)

## 2013-08-20 MED ORDER — MELOXICAM 15 MG PO TABS: 15.0000 mg | ORAL_TABLET | Freq: Every day | ORAL | Status: DC

## 2013-08-20 NOTE — Patient Instructions (Signed)
Schedule your complete physical for October We'll notify you of your lab results and make any changes ICE by rolling your feet over the frozen bottle Use the Mobic daily for inflammation Call with any questions or concerns Have a great summer!!!

## 2013-08-20 NOTE — Progress Notes (Signed)
Pre visit review using our clinic review tool, if applicable. No additional management support is needed unless otherwise documented below in the visit note. 

## 2013-08-20 NOTE — Assessment & Plan Note (Signed)
New.  Reviewed dx w/ pt and need to wear cushioned, supportive shoes.  Start daily NSAIDs, ice.  If no improvement, will refer to sports med.  Pt expressed understanding and is in agreement w/ plan.

## 2013-08-20 NOTE — Assessment & Plan Note (Signed)
Chronic problem.  Well controlled.  Asymptomatic.  Check labs.  No anticipated med changes. 

## 2013-08-20 NOTE — Progress Notes (Signed)
   Subjective:    Patient ID: Ann Wagner, female    DOB: 04/11/1945, 68 y.o.   MRN: [id number]  HPI HTN- chronic problem, on Lisinopril, Amlodipine, HCTZ w/ good control.  Denies CP, SOB, HAs, visual changes, edema.  Hyperlipidemia- chronic problem, on Simvastation.  Denies abd pain, N/V, myalgias.  Foot pain- bilateral, worse w/ 1st step after prolonged sitting.  Wears flats, flip-flops, frequently goes barefoot   Review of Systems For ROS see HPI    Objective:   Physical Exam  Vitals reviewed. Constitutional: She is oriented to person, place, and time. She appears well-developed and well-nourished. No distress.  HENT:  Head: Normocephalic and atraumatic.  Eyes: Conjunctivae and EOM are normal. Pupils are equal, round, and reactive to light.  Neck: Normal range of motion. Neck supple. No thyromegaly present.  Cardiovascular: Normal rate, regular rhythm, normal heart sounds and intact distal pulses.   No murmur heard. Pulmonary/Chest: Effort normal and breath sounds normal. No respiratory distress.  Abdominal: Soft. She exhibits no distension. There is no tenderness.  Musculoskeletal: She exhibits tenderness (over insertion of plantar fascia bilaterally). She exhibits no edema.  Lymphadenopathy:    She has no cervical adenopathy.  Neurological: She is alert and oriented to person, place, and time.  Skin: Skin is warm and dry.  Psychiatric: She has a normal mood and affect. Her behavior is normal.          Assessment & Plan:

## 2013-08-20 NOTE — Assessment & Plan Note (Signed)
Chronic problem.  Tolerating statin w/o difficulty.  Check labs.  Adjust meds prn  

## 2013-08-21 ENCOUNTER — Telehealth: Payer: Self-pay | Admitting: Family Medicine

## 2013-08-21 ENCOUNTER — Other Ambulatory Visit: Payer: Self-pay | Admitting: Family Medicine

## 2013-08-21 DIAGNOSIS — R7309 Other abnormal glucose: Secondary | ICD-10-CM

## 2013-08-21 NOTE — Telephone Encounter (Signed)
Relevant patient education assigned to patient using Emmi. ° °

## 2013-08-22 ENCOUNTER — Other Ambulatory Visit (INDEPENDENT_AMBULATORY_CARE_PROVIDER_SITE_OTHER): Payer: Medicare Other

## 2013-08-22 DIAGNOSIS — R7309 Other abnormal glucose: Secondary | ICD-10-CM

## 2013-08-23 ENCOUNTER — Encounter: Payer: Self-pay | Admitting: General Practice

## 2013-08-23 LAB — HEMOGLOBIN A1C: Hgb A1c MFr Bld: 6.4 % (ref 4.6–6.5)

## 2013-09-12 ENCOUNTER — Other Ambulatory Visit: Payer: Self-pay | Admitting: Family Medicine

## 2013-09-12 NOTE — Telephone Encounter (Signed)
Med filled.  

## 2013-09-16 ENCOUNTER — Other Ambulatory Visit: Payer: Self-pay | Admitting: Family Medicine

## 2013-09-16 NOTE — Telephone Encounter (Signed)
Med filled.  

## 2013-10-09 ENCOUNTER — Other Ambulatory Visit: Payer: Self-pay | Admitting: Family Medicine

## 2013-10-09 NOTE — Telephone Encounter (Signed)
Med filled for pt.

## 2013-11-14 ENCOUNTER — Other Ambulatory Visit: Payer: Self-pay | Admitting: Family Medicine

## 2013-11-14 NOTE — Telephone Encounter (Signed)
Med filled.  

## 2013-11-19 ENCOUNTER — Other Ambulatory Visit: Payer: Self-pay | Admitting: Family Medicine

## 2013-11-19 NOTE — Telephone Encounter (Signed)
Med filled.  

## 2014-01-11 ENCOUNTER — Other Ambulatory Visit: Payer: Self-pay | Admitting: Family Medicine

## 2014-01-13 NOTE — Telephone Encounter (Signed)
Med filled.  

## 2014-04-17 ENCOUNTER — Other Ambulatory Visit: Payer: Self-pay | Admitting: Family Medicine

## 2014-04-17 NOTE — Telephone Encounter (Signed)
Med filled, letter mailed to pt to make an appt. For a physical.

## 2014-04-30 ENCOUNTER — Encounter: Payer: Self-pay | Admitting: Family Medicine

## 2014-04-30 ENCOUNTER — Ambulatory Visit (INDEPENDENT_AMBULATORY_CARE_PROVIDER_SITE_OTHER): Payer: Medicare Other | Admitting: Family Medicine

## 2014-04-30 VITALS — BP 130/80 | HR 68 | Temp 97.9°F | Resp 16 | Ht 65.5 in | Wt 180.5 lb

## 2014-04-30 DIAGNOSIS — Z0000 Encounter for general adult medical examination without abnormal findings: Secondary | ICD-10-CM

## 2014-04-30 DIAGNOSIS — E038 Other specified hypothyroidism: Secondary | ICD-10-CM

## 2014-04-30 DIAGNOSIS — E785 Hyperlipidemia, unspecified: Secondary | ICD-10-CM

## 2014-04-30 DIAGNOSIS — Z23 Encounter for immunization: Secondary | ICD-10-CM

## 2014-04-30 DIAGNOSIS — I10 Essential (primary) hypertension: Secondary | ICD-10-CM

## 2014-04-30 DIAGNOSIS — E559 Vitamin D deficiency, unspecified: Secondary | ICD-10-CM

## 2014-04-30 DIAGNOSIS — Z8249 Family history of ischemic heart disease and other diseases of the circulatory system: Secondary | ICD-10-CM | POA: Diagnosis not present

## 2014-04-30 LAB — VITAMIN D 25 HYDROXY (VIT D DEFICIENCY, FRACTURES): VITD: 11.88 ng/mL — AB (ref 30.00–100.00)

## 2014-04-30 LAB — CBC WITH DIFFERENTIAL/PLATELET
Basophils Absolute: 0.1 10*3/uL (ref 0.0–0.1)
Basophils Relative: 0.7 % (ref 0.0–3.0)
Eosinophils Absolute: 0.2 10*3/uL (ref 0.0–0.7)
Eosinophils Relative: 1.7 % (ref 0.0–5.0)
HEMATOCRIT: 40.8 % (ref 36.0–46.0)
HEMOGLOBIN: 14 g/dL (ref 12.0–15.0)
LYMPHS ABS: 3 10*3/uL (ref 0.7–4.0)
LYMPHS PCT: 34.6 % (ref 12.0–46.0)
MCHC: 34.3 g/dL (ref 30.0–36.0)
MCV: 86.2 fl (ref 78.0–100.0)
Monocytes Absolute: 0.8 10*3/uL (ref 0.1–1.0)
Monocytes Relative: 9.2 % (ref 3.0–12.0)
Neutro Abs: 4.7 10*3/uL (ref 1.4–7.7)
Neutrophils Relative %: 53.8 % (ref 43.0–77.0)
Platelets: 305 10*3/uL (ref 150.0–400.0)
RBC: 4.73 Mil/uL (ref 3.87–5.11)
RDW: 13.3 % (ref 11.5–15.5)
WBC: 8.8 10*3/uL (ref 4.0–10.5)

## 2014-04-30 LAB — LIPID PANEL
CHOLESTEROL: 167 mg/dL (ref 0–200)
HDL: 42.8 mg/dL (ref >39.00)
LDL CALC: 91 mg/dL (ref 0–99)
NonHDL: 124.2
TRIGLYCERIDES: 166 mg/dL — AB (ref 0.0–149.0)
Total CHOL/HDL Ratio: 4
VLDL: 33.2 mg/dL (ref 0.0–40.0)

## 2014-04-30 LAB — BASIC METABOLIC PANEL
BUN: 26 mg/dL — ABNORMAL HIGH (ref 6–23)
CO2: 29 mEq/L (ref 19–32)
Calcium: 9.8 mg/dL (ref 8.4–10.5)
Chloride: 101 mEq/L (ref 96–112)
Creatinine, Ser: 0.89 mg/dL (ref 0.40–1.20)
GFR: 66.76 mL/min (ref >60.00)
Glucose, Bld: 97 mg/dL (ref 70–99)
Potassium: 3.7 mEq/L (ref 3.5–5.1)
Sodium: 137 mEq/L (ref 135–145)

## 2014-04-30 LAB — TSH: TSH: 1.48 u[IU]/mL (ref 0.35–4.50)

## 2014-04-30 LAB — HEPATIC FUNCTION PANEL
ALT: 27 U/L (ref 0–35)
AST: 25 U/L (ref 0–37)
Albumin: 4.6 g/dL (ref 3.5–5.2)
Alkaline Phosphatase: 86 U/L (ref 39–117)
Bilirubin, Direct: 0.1 mg/dL (ref 0.0–0.3)
TOTAL PROTEIN: 7.4 g/dL (ref 6.0–8.3)
Total Bilirubin: 0.5 mg/dL (ref 0.2–1.2)

## 2014-04-30 NOTE — Progress Notes (Signed)
Pre visit review using our clinic review tool, if applicable. No additional management support is needed unless otherwise documented below in the visit note. 

## 2014-04-30 NOTE — Progress Notes (Signed)
   Subjective:    Patient ID: Ann Wagner, female    DOB: 11-May-1945, 69 y.o.   MRN: [id number]  HPI Here today for CPE.  Risk Factors: HTN- chronic problem, on Lisinopril, HCTZ, amlodipine.  Pt has family hx of CAD and is nervous about approaching age 30. Hyperlipidemia- chronic problem, on Simvastatin Hypothyroid- chronic problem, on Levothyroxine Physical Activity: very active but not formal exercise Fall Risk: low risk Depression: chronic problem, pt is on Paxil w/ good control of sxs Hearing: normal to conversational tones and whispered voice at 6 ft ADL's: independent Cognitive: normal linear thought process, memory and attention intact Home Safety: safe at home Height, Weight, BMI, Visual Acuity: see vitals, vision corrected to 20/20 w/ glasses Counseling: UTD on colonoscopy, due for mammo.  DEXA due this fall.  No need for paps. Labs Ordered: See A&P Care Plan: See A&P    Review of Systems Patient reports no vision/ hearing changes, adenopathy,fever, weight change,  persistant/recurrent hoarseness , swallowing issues, chest pain, palpitations, edema, persistant/recurrent cough, hemoptysis, dyspnea (rest/exertional/paroxysmal nocturnal), gastrointestinal bleeding (melena, rectal bleeding), abdominal pain, significant heartburn, bowel changes, GU symptoms (dysuria, hematuria, incontinence), Gyn symptoms (abnormal  bleeding, pain),  syncope, focal weakness, memory loss, numbness & tingling, skin/hair/nail changes, abnormal bruising or bleeding, anxiety, or depression.   + migraines    Objective:   Physical Exam General Appearance:    Alert, cooperative, no distress, appears stated age  Head:    Normocephalic, without obvious abnormality, atraumatic  Eyes:    PERRL, conjunctiva/corneas clear, EOM's intact, fundi    benign, both eyes  Ears:    Normal TM's and external ear canals, both ears  Nose:   Nares normal, septum midline, mucosa normal, no drainage    or sinus tenderness   Throat:   Lips, mucosa, and tongue normal; teeth and gums normal  Neck:   Supple, symmetrical, trachea midline, no adenopathy;    Thyroid: no enlargement/tenderness/nodules  Back:     Symmetric, no curvature, ROM normal, no CVA tenderness  Lungs:     Clear to auscultation bilaterally, respirations unlabored  Chest Wall:    No tenderness or deformity   Heart:    Regular rate and rhythm, S1 and S2 normal, no murmur, rub   or gallop  Breast Exam:    Deferred to mammo  Abdomen:     Soft, non-tender, bowel sounds active all four quadrants,    no masses, no organomegaly  Genitalia:    Deferred  Rectal:    Extremities:   Extremities normal, atraumatic, no cyanosis or edema  Pulses:   2+ and symmetric all extremities  Skin:   Skin color, texture, turgor normal, no rashes or lesions  Lymph nodes:   Cervical, supraclavicular, and axillary nodes normal  Neurologic:   CNII-XII intact, normal strength, sensation and reflexes    throughout          Assessment & Plan:

## 2014-04-30 NOTE — Patient Instructions (Signed)
Follow up in 6 months to recheck BP and cholesterol We'll notify you of your lab results and make any changes if needed Keep up the good work on healthy diet and regular exercise- you look great! We'll call you with your cardiology appt to assess your risk CALL AND SCHEDULE YOUR MAMMOGRAM!!! Start Claritin or Zyrtec daily to decrease your allergy congestion Call with any questions or concerns Have a great trip!!!

## 2014-05-01 ENCOUNTER — Other Ambulatory Visit: Payer: Self-pay | Admitting: General Practice

## 2014-05-01 ENCOUNTER — Telehealth: Payer: Self-pay | Admitting: Family Medicine

## 2014-05-01 MED ORDER — VITAMIN D (ERGOCALCIFEROL) 1.25 MG (50000 UNIT) PO CAPS: 50000.0000 [IU] | ORAL_CAPSULE | ORAL | Status: DC

## 2014-05-01 NOTE — Telephone Encounter (Signed)
Returning your call about lab results °

## 2014-05-01 NOTE — Telephone Encounter (Signed)
Pt notified of results

## 2014-05-04 NOTE — Assessment & Plan Note (Signed)
Chronic problem.  Pt is currently asymptomatic and feels her med dose is correct.  Check labs.  Adjust meds prn

## 2014-05-04 NOTE — Assessment & Plan Note (Signed)
Pt has hx of similar.  Check labs and replete prn. °

## 2014-05-04 NOTE — Assessment & Plan Note (Signed)
Chronic problem.  Adequate control.  Asymptomatic.  Check labs.  No anticipated med changes 

## 2014-05-04 NOTE — Assessment & Plan Note (Addendum)
Pt's PE WNL.  UTD on colonoscopy.  Overdue for mammo- pt plans to schedule.  Written screening schedule updated and given to pt.  Check labs.  Anticipatory guidance provided.

## 2014-05-04 NOTE — Assessment & Plan Note (Signed)
Chronic problem.  Tolerating statin w/o difficulty.  Check labs.  Adjust meds prn  

## 2014-05-29 ENCOUNTER — Encounter: Payer: Self-pay | Admitting: Family Medicine

## 2014-06-02 ENCOUNTER — Other Ambulatory Visit: Payer: Self-pay | Admitting: Family Medicine

## 2014-06-02 NOTE — Telephone Encounter (Signed)
Med filled.  

## 2014-06-04 ENCOUNTER — Other Ambulatory Visit: Payer: Self-pay | Admitting: Family Medicine

## 2014-06-04 NOTE — Telephone Encounter (Signed)
Med filled.  

## 2014-06-09 ENCOUNTER — Other Ambulatory Visit: Payer: Self-pay

## 2014-06-09 DIAGNOSIS — Z1231 Encounter for screening mammogram for malignant neoplasm of breast: Secondary | ICD-10-CM

## 2014-06-11 ENCOUNTER — Ambulatory Visit
Admission: RE | Admit: 2014-06-11 | Discharge: 2014-06-11 | Disposition: A | Discharge status: Home / Self Care | Payer: Medicare Other | Source: Ambulatory Visit

## 2014-06-11 ENCOUNTER — Encounter (INDEPENDENT_AMBULATORY_CARE_PROVIDER_SITE_OTHER): Payer: Self-pay

## 2014-06-11 DIAGNOSIS — Z1231 Encounter for screening mammogram for malignant neoplasm of breast: Secondary | ICD-10-CM

## 2014-07-02 ENCOUNTER — Other Ambulatory Visit: Payer: Self-pay | Admitting: Family Medicine

## 2014-07-02 NOTE — Telephone Encounter (Signed)
Med filled.  

## 2014-07-11 ENCOUNTER — Ambulatory Visit: Payer: Medicare Other | Admitting: Cardiology

## 2014-07-30 ENCOUNTER — Other Ambulatory Visit: Payer: Self-pay | Admitting: Family Medicine

## 2014-07-30 NOTE — Telephone Encounter (Signed)
Med filled.  

## 2014-08-01 ENCOUNTER — Encounter: Payer: Self-pay | Admitting: *Deleted

## 2014-08-04 ENCOUNTER — Ambulatory Visit (INDEPENDENT_AMBULATORY_CARE_PROVIDER_SITE_OTHER): Payer: Medicare Other | Admitting: Cardiology

## 2014-08-04 ENCOUNTER — Encounter: Payer: Self-pay | Admitting: Cardiology

## 2014-08-04 VITALS — BP 124/64 | HR 51 | Ht 65.5 in | Wt 180.0 lb

## 2014-08-04 DIAGNOSIS — R0609 Other forms of dyspnea: Secondary | ICD-10-CM

## 2014-08-04 DIAGNOSIS — I10 Essential (primary) hypertension: Secondary | ICD-10-CM

## 2014-08-04 DIAGNOSIS — R9439 Abnormal result of other cardiovascular function study: Secondary | ICD-10-CM

## 2014-08-04 DIAGNOSIS — E785 Hyperlipidemia, unspecified: Secondary | ICD-10-CM

## 2014-08-04 MED ORDER — ASPIRIN EC 81 MG PO TBEC: 81.0000 mg | DELAYED_RELEASE_TABLET | Freq: Every day | ORAL | Status: AC

## 2014-08-04 NOTE — Patient Instructions (Addendum)
Medication Instructions:  Your physician has recommended you make the following change in your medication:  START Aspirin 81 mg by mouth daily  Labwork: NONE  Testing/Procedures: Your physician has requested that you have a carotid duplex. This test is an ultrasound of the carotid arteries in your neck. It looks at blood flow through these arteries that supply the brain with blood. Allow one hour for this exam. There are no restrictions or special instructions.  Your physician has requested that you have an exercise tolerance test. For further information please visit [email or web address]. Please also follow instruction sheet, as given.  Follow-Up: Your physician wants you to follow-up in: 1 year with Dr. Meda Coffee.  You will receive a reminder letter in the mail two months in advance. If you don't receive a letter, please call our office to schedule the follow-up appointment.   Any Other Special Instructions Will Be Listed Below (If Applicable).

## 2014-08-04 NOTE — Progress Notes (Signed)
Patient ID: Ann Wagner, female   DOB: October 31, 1945, 69 y.o.   MRN: [id number]      Cardiology Office Note   Date:  08/04/2014   ID:  OFFIE PICKRON, DOB Jun 28, 1945, MRN [id number]  PCP:  Ann Asa, MD  Cardiologist:  Dorothy Spark, MD   Chief cardiology: preventive cardiology, DOE   History of Present Illness: Ann Wagner is a 69 y.o. female who presents for preventive cardiology. She has h/o HTN, hyperlipidemia, family history of CAD, who is coming concerned about potential MI being 37 soon and that's when her parents developed MIs. Her mother had MI at age 18 and died of CHF at age 74. Father had MI in his 3', had stent placed, died of aortic aneurysm at age  The patient is retired, very active, carrying heavy stuff, no symptoms, walks 3x/week 45 minutes. Has h/o vasovagal syncope - 2 syncopal episodes in the last 3 years. While she walks sometimes she has jaw pain and sometimes she has difficuties to talk. No palpitations, mild LE edema when she eats salt. She denies orthopnea or PND.   Past Medical History  Diagnosis Date  . Arthritis   . History of chicken pox   . Allergic rhinitis   . GERD (gastroesophageal reflux disease)   . Hyperlipidemia   . Hyperthyroidism   . Syncope   . Hypertension     Past Surgical History  Procedure Laterality Date  . Tubal ligation       Current Outpatient Prescriptions  Medication Sig Dispense Refill  . amLODipine (NORVASC) 10 MG tablet TAKE 1 TABLET BY MOUTH EVERY DAY 30 tablet 6  . hydrochlorothiazide (HYDRODIURIL) 25 MG tablet TAKE 1 TABLET BY MOUTH EVERY DAY 30 tablet 6  . levothyroxine (SYNTHROID, LEVOTHROID) 75 MCG tablet TAKE 1 TABLET BY MOUTH EVERY DAY 90 tablet 1  . lisinopril (PRINIVIL,ZESTRIL) 40 MG tablet TAKE 1 TABLET BY MOUTH EVERY DAY 30 tablet 6  . meloxicam (MOBIC) 15 MG tablet Take 1 tablet (15 mg total) by mouth daily. (Patient taking differently: Take 15 mg by mouth as needed. ) 30 tablet 1  .  PARoxetine (PAXIL) 10 MG tablet TAKE 1 TABLET BY MOUTH EVERY DAY 90 tablet 1  . simvastatin (ZOCOR) 40 MG tablet TAKE 1 TABLET BY MOUTH AT BEDTIME 30 tablet 6  . Vitamin D, Ergocalciferol, (DRISDOL) 50000 UNITS CAPS capsule Take 1 capsule (50,000 Units total) by mouth every 7 (seven) days. 4 capsule 3   No current facility-administered medications for this visit.    Allergies:   Propoxyphene n-acetaminophen    Social History:  The patient  reports that she has quit smoking. She does not have any smokeless tobacco history on file. She reports that she drinks alcohol.   Family History:  The patient's family history includes Coronary artery disease in her father, mother, and paternal grandmother; Diabetes in her mother; Heart attack in her mother; Hypertension in her father, mother, and paternal grandmother. There is no history of Stroke or Cancer.    ROS:  Please see the history of present illness.   Otherwise, review of systems are positive for none.   All other systems are reviewed and negative.    PHYSICAL EXAM: VS:  BP 124/64 mmHg  Pulse 51  Ht 5' 5.5" (1.664 m)  Wt 180 lb (81.647 kg)  BMI 29.49 kg/m2 , BMI Body mass index is 29.49 kg/(m^2). GEN: Well nourished, well developed, in no acute distress HEENT: normal Neck: no  JVD, carotid bruits, or masses Cardiac: RRR; no murmurs, rubs, or gallops,no edema  Respiratory:  clear to auscultation bilaterally, normal work of breathing GI: soft, nontender, nondistended, + BS MS: no deformity or atrophy Skin: warm and dry, no rash Neuro:  Strength and sensation are intact Psych: euthymic mood, full affect   EKG:  EKG is ordered today. The ekg ordered today demonstrates SB, otherwise normal ECG   Recent Labs: 04/30/2014: ALT 27; BUN 26*; Creatinine 0.89; Hemoglobin 14.0; Platelets 305.0; Potassium 3.7; Sodium 137; TSH 1.48    Lipid Panel    Component Value Date/Time   CHOL 167 04/30/2014 1413   TRIG 166.0* 04/30/2014 1413   HDL  42.80 04/30/2014 1413   CHOLHDL 4 04/30/2014 1413   VLDL 33.2 04/30/2014 1413   LDLCALC 91 04/30/2014 1413   LDLDIRECT 92.9 12/20/2011 0924      Wt Readings from Last 3 Encounters:  08/04/14 180 lb (81.647 kg)  04/30/14 180 lb 8 oz (81.874 kg)  08/20/13 179 lb 2 oz (81.251 kg)      ASSESSMENT AND PLAN:  1.  Exertional jaw pain - we will schedule a ETT to rule out ischemia.  Also carotid Duplex. We will start aspirin 81 mg po daily, continue zocor, fish oli, metoprolol.  2. HTN - controlled on current regimen.  3. Hyperlipidemia - as above, at goal HDL, LDL, TG midldly elevated, for now I would continue the same regimen.   4. H/O vasovagal syncope - on metoprolol, advised to drink plenty of water  Follow up in 1 year.  Signed, Dorothy Spark, MD  08/04/2014 2:15 PM    [facility] [address], [place], [place]  [address] Phone: [phone]; Fax: [phone]

## 2014-09-23 ENCOUNTER — Encounter: Payer: Self-pay | Admitting: Physician Assistant

## 2014-09-30 ENCOUNTER — Encounter: Payer: Self-pay | Admitting: Physician Assistant

## 2014-10-21 ENCOUNTER — Encounter (HOSPITAL_COMMUNITY): Payer: Self-pay

## 2014-10-21 ENCOUNTER — Ambulatory Visit: Payer: Self-pay | Admitting: Nurse Practitioner

## 2014-11-05 ENCOUNTER — Other Ambulatory Visit: Payer: Self-pay | Admitting: Family Medicine

## 2014-11-05 NOTE — Telephone Encounter (Signed)
meds filled, no further without BP and cholesterol follow up.

## 2014-11-06 ENCOUNTER — Other Ambulatory Visit: Payer: Self-pay | Admitting: Cardiology

## 2014-11-06 DIAGNOSIS — R6884 Jaw pain: Secondary | ICD-10-CM

## 2014-11-11 ENCOUNTER — Encounter: Payer: Self-pay | Admitting: Physician Assistant

## 2014-11-11 ENCOUNTER — Inpatient Hospital Stay (HOSPITAL_COMMUNITY): Admission: RE | Admit: 2014-11-11 | Payer: Self-pay | Source: Ambulatory Visit

## 2014-11-11 ENCOUNTER — Encounter (HOSPITAL_COMMUNITY): Payer: Self-pay

## 2014-11-25 ENCOUNTER — Encounter (HOSPITAL_COMMUNITY): Payer: Self-pay

## 2014-12-09 ENCOUNTER — Ambulatory Visit (HOSPITAL_COMMUNITY)
Admission: RE | Admit: 2014-12-09 | Discharge: 2014-12-09 | Disposition: A | Discharge status: Home / Self Care | Payer: Medicare Other | Source: Ambulatory Visit | Attending: Cardiology | Admitting: Cardiology

## 2014-12-09 ENCOUNTER — Telehealth (HOSPITAL_COMMUNITY): Payer: Self-pay

## 2014-12-09 DIAGNOSIS — R6884 Jaw pain: Secondary | ICD-10-CM | POA: Diagnosis not present

## 2014-12-09 DIAGNOSIS — E785 Hyperlipidemia, unspecified: Secondary | ICD-10-CM | POA: Insufficient documentation

## 2014-12-09 DIAGNOSIS — Z87891 Personal history of nicotine dependence: Secondary | ICD-10-CM | POA: Insufficient documentation

## 2014-12-09 DIAGNOSIS — I6523 Occlusion and stenosis of bilateral carotid arteries: Secondary | ICD-10-CM | POA: Insufficient documentation

## 2014-12-09 DIAGNOSIS — I10 Essential (primary) hypertension: Secondary | ICD-10-CM | POA: Insufficient documentation

## 2014-12-09 NOTE — Telephone Encounter (Signed)
Encounter complete. 

## 2014-12-11 ENCOUNTER — Ambulatory Visit (HOSPITAL_COMMUNITY)
Admission: RE | Admit: 2014-12-11 | Discharge: 2014-12-11 | Disposition: A | Discharge status: Home / Self Care | Payer: Medicare Other | Source: Ambulatory Visit | Attending: Cardiovascular Disease | Admitting: Cardiovascular Disease

## 2014-12-11 ENCOUNTER — Telehealth: Payer: Self-pay | Admitting: *Deleted

## 2014-12-11 DIAGNOSIS — I10 Essential (primary) hypertension: Secondary | ICD-10-CM

## 2014-12-11 DIAGNOSIS — I6523 Occlusion and stenosis of bilateral carotid arteries: Secondary | ICD-10-CM

## 2014-12-11 DIAGNOSIS — R6884 Jaw pain: Secondary | ICD-10-CM | POA: Insufficient documentation

## 2014-12-11 DIAGNOSIS — E785 Hyperlipidemia, unspecified: Secondary | ICD-10-CM | POA: Diagnosis not present

## 2014-12-11 DIAGNOSIS — R42 Dizziness and giddiness: Secondary | ICD-10-CM

## 2014-12-11 DIAGNOSIS — I493 Ventricular premature depolarization: Secondary | ICD-10-CM | POA: Insufficient documentation

## 2014-12-11 DIAGNOSIS — I6529 Occlusion and stenosis of unspecified carotid artery: Secondary | ICD-10-CM | POA: Insufficient documentation

## 2014-12-11 DIAGNOSIS — R0609 Other forms of dyspnea: Secondary | ICD-10-CM | POA: Diagnosis not present

## 2014-12-11 DIAGNOSIS — R5383 Other fatigue: Secondary | ICD-10-CM | POA: Insufficient documentation

## 2014-12-11 LAB — EXERCISE TOLERANCE TEST
Estimated workload: 10.1 METS
Exercise duration (min): 9 min
Exercise duration (sec): 0 s
MPHR: 150 {beats}/min
Peak HR: 121 {beats}/min
Percent HR: 80 %
RPE: 17
Rest HR: 59 {beats}/min

## 2014-12-11 NOTE — Telephone Encounter (Signed)
Notified the pt that per Dr Meda Coffee her carotid duplex showed moderate plaque on the right, and mild on the left carotid artery.  Informed the pt that per Dr Meda Coffee, no intervention is needed at this time, just repeat and follow this study in one year.  Informed the pt that someone from our scheduling dept will be in in contact with her, to have this study done again in 1 year.  Pt verbalized understanding and agrees with this plan.

## 2014-12-11 NOTE — Telephone Encounter (Signed)
-----   Message from Dorothy Spark, MD sent at 12/10/2014  4:11 PM EDT ----- Moderate plaque on the right, mild on the left carotid artery, no intervention needed. We will repeat in 1 year.

## 2014-12-16 ENCOUNTER — Encounter: Payer: Self-pay | Admitting: *Deleted

## 2014-12-16 ENCOUNTER — Telehealth: Payer: Self-pay | Admitting: Cardiology

## 2014-12-16 DIAGNOSIS — R9430 Abnormal result of cardiovascular function study, unspecified: Secondary | ICD-10-CM | POA: Insufficient documentation

## 2014-12-16 NOTE — Telephone Encounter (Signed)
Notified the pt that per Dr Meda Coffee, based on her Exercise Tolerance Test results being abnormal, she recommends the pt have a coronary CT done to further investigate.  Provided pt education on what a coronary ct would look at.  Informed the pt that I will send our schedulers a message to pre-cert this test through the pts insurance and call the pt back to have this scheduled.  Informed the pt that this test is done over a [place] verbalized understanding and agrees with this plan.

## 2014-12-16 NOTE — Telephone Encounter (Signed)
-----   Message from Dorothy Spark, MD sent at 12/12/2014  9:38 AM EDT ----- Please schedule a coronary CT  ----- Message -----    From: Nuala Alpha, LPN    Sent: [phone]   9:12 AM      To: Dorothy Spark, MD

## 2014-12-16 NOTE — Telephone Encounter (Signed)
Left message for the pt to call back.

## 2014-12-16 NOTE — Telephone Encounter (Signed)
New message     Returning a call to [facility]

## 2014-12-22 ENCOUNTER — Telehealth: Payer: Self-pay | Admitting: *Deleted

## 2014-12-22 ENCOUNTER — Encounter: Payer: Self-pay | Admitting: Cardiology

## 2014-12-22 DIAGNOSIS — I10 Essential (primary) hypertension: Secondary | ICD-10-CM

## 2014-12-22 DIAGNOSIS — E785 Hyperlipidemia, unspecified: Secondary | ICD-10-CM

## 2014-12-22 DIAGNOSIS — R9430 Abnormal result of cardiovascular function study, unspecified: Secondary | ICD-10-CM

## 2014-12-22 NOTE — Telephone Encounter (Signed)
BMET order is in the system, for protocol of cardiac cta needed.  Pts lab appt scheduled for tomorrow 10/25, per [facility] scheduler Mack Guise.

## 2014-12-22 NOTE — Telephone Encounter (Signed)
-----   Message from Armando Gang sent at 12/22/2014  4:39 PM EDT ----- Regarding: FW: Cardiac CTA 12-26-14 Meda Coffee to read / per radiology she need bmp/saf   ----- Message -----    From: Benancio Deeds    Sent: 12/19/2014  10:02 AM      To: Armando Gang Subject: Cardiac CTA                                    Auth # [id number] exp 02/02/15  Ready to schedule  Thank you!  :)  Caryl Pina

## 2014-12-23 ENCOUNTER — Other Ambulatory Visit (INDEPENDENT_AMBULATORY_CARE_PROVIDER_SITE_OTHER): Payer: Medicare Other | Admitting: *Deleted

## 2014-12-23 DIAGNOSIS — R9430 Abnormal result of cardiovascular function study, unspecified: Secondary | ICD-10-CM

## 2014-12-23 DIAGNOSIS — E785 Hyperlipidemia, unspecified: Secondary | ICD-10-CM

## 2014-12-23 DIAGNOSIS — I10 Essential (primary) hypertension: Secondary | ICD-10-CM

## 2014-12-23 LAB — BASIC METABOLIC PANEL
BUN: 18 mg/dL (ref 7–25)
CO2: 27 mmol/L (ref 20–31)
Calcium: 9.8 mg/dL (ref 8.6–10.4)
Chloride: 99 mmol/L (ref 98–110)
Creat: 0.86 mg/dL (ref 0.60–0.93)
Glucose, Bld: 97 mg/dL (ref 65–99)
Potassium: 3.6 mmol/L (ref 3.5–5.3)
Sodium: 137 mmol/L (ref 135–146)

## 2014-12-26 ENCOUNTER — Ambulatory Visit (HOSPITAL_COMMUNITY)
Admission: RE | Admit: 2014-12-26 | Discharge: 2014-12-26 | Disposition: A | Discharge status: Home / Self Care | Payer: Medicare Other | Source: Ambulatory Visit | Attending: Cardiology | Admitting: Cardiology

## 2014-12-26 ENCOUNTER — Encounter (HOSPITAL_COMMUNITY): Payer: Self-pay

## 2014-12-26 DIAGNOSIS — I10 Essential (primary) hypertension: Secondary | ICD-10-CM | POA: Insufficient documentation

## 2014-12-26 DIAGNOSIS — Z8249 Family history of ischemic heart disease and other diseases of the circulatory system: Secondary | ICD-10-CM | POA: Diagnosis not present

## 2014-12-26 DIAGNOSIS — K760 Fatty (change of) liver, not elsewhere classified: Secondary | ICD-10-CM | POA: Diagnosis not present

## 2014-12-26 DIAGNOSIS — E785 Hyperlipidemia, unspecified: Secondary | ICD-10-CM | POA: Insufficient documentation

## 2014-12-26 DIAGNOSIS — R9430 Abnormal result of cardiovascular function study, unspecified: Secondary | ICD-10-CM

## 2014-12-26 DIAGNOSIS — R9439 Abnormal result of other cardiovascular function study: Secondary | ICD-10-CM | POA: Insufficient documentation

## 2014-12-26 DIAGNOSIS — J8410 Pulmonary fibrosis, unspecified: Secondary | ICD-10-CM | POA: Diagnosis not present

## 2014-12-26 DIAGNOSIS — I2510 Atherosclerotic heart disease of native coronary artery without angina pectoris: Secondary | ICD-10-CM | POA: Insufficient documentation

## 2014-12-26 MED ORDER — NITROGLYCERIN 0.4 MG SL SUBL
SUBLINGUAL_TABLET | SUBLINGUAL | Status: AC
  Administered 2014-12-26: 0.8 mg via SUBLINGUAL
  Filled 2014-12-26: qty 2

## 2014-12-26 MED ORDER — METOPROLOL TARTRATE 1 MG/ML IV SOLN
5.0000 mg | Freq: Once | INTRAVENOUS | Status: AC
  Administered 2014-12-26: 5 mg via INTRAVENOUS
  Filled 2014-12-26: qty 5

## 2014-12-26 MED ORDER — IOHEXOL 350 MG/ML SOLN
80.0000 mL | Freq: Once | INTRAVENOUS | Status: AC | PRN
  Administered 2014-12-26: 80 mL via INTRAVENOUS

## 2014-12-26 MED ORDER — NITROGLYCERIN 0.4 MG SL SUBL
0.8000 mg | SUBLINGUAL_TABLET | Freq: Once | SUBLINGUAL | Status: AC
  Administered 2014-12-26: 0.8 mg via SUBLINGUAL
  Filled 2014-12-26: qty 25

## 2014-12-26 MED ORDER — METOPROLOL TARTRATE 1 MG/ML IV SOLN
INTRAVENOUS | Status: AC
  Administered 2014-12-26: 5 mg via INTRAVENOUS
  Filled 2014-12-26: qty 5

## 2014-12-29 ENCOUNTER — Telehealth: Payer: Self-pay | Admitting: Cardiology

## 2014-12-29 NOTE — Telephone Encounter (Signed)
Spoke with patient about recent coronary CT results.

## 2014-12-29 NOTE — Telephone Encounter (Signed)
Follow Up  Pt calling to follow up on CT results. Please call.

## 2014-12-31 ENCOUNTER — Telehealth: Payer: Self-pay | Admitting: Cardiology

## 2014-12-31 ENCOUNTER — Encounter: Payer: Self-pay | Admitting: *Deleted

## 2014-12-31 DIAGNOSIS — Z01812 Encounter for preprocedural laboratory examination: Secondary | ICD-10-CM

## 2014-12-31 NOTE — Telephone Encounter (Signed)
-----   Message from Katrine Coho, RN sent at 12/29/2014  3:58 PM EDT ----- Discussed CT results and recommendations for a left cardiac cath with patient. Pt agreed to scheduling cardiac cath.  Pt states she will be available 12/30/14 then out of town 11/2,11/3, and 01/02/15, pt states she would be lifting more than 15 pounds this week. Pt states she will be available 11/7, 11/8, 11/9 then out of town 11/10 and 01/09/15, pt states she would be lifting more than 15 pounds this week.  Pt states she will be available the entire week 01/12/15. Pt states she will be out of town the entire weeks of 01/19/15 and 01/26/15. Pt denies chest pain/tightness/SOB.  Pt advised I will forward to Dr Meda Coffee for recommendations for timing of scheduling cardiac cath given patient's schedule the next few weeks.

## 2014-12-31 NOTE — Telephone Encounter (Signed)
Pt contacted the office back, in regards to scheduling her left cardiac cath, recommended by Dr Meda Coffee, for abnormal stress test and abnormal cardiac ct. Pt request that her cath be scheduled for Monday 01/12/15, early case, and pre-cath labs to be scheduled the week before on 01/07/15.  Informed the pt that I will call her back with scheduled dates and times.  Pt verbalized understanding and agrees with this plan.

## 2014-12-31 NOTE — Telephone Encounter (Signed)
New message ° ° ° ° °Returning a call to the nurse °

## 2014-12-31 NOTE — Addendum Note (Signed)
Addended by: Dorothy Spark on: 12/31/2014 01:53 PM   Modules accepted: Orders

## 2014-12-31 NOTE — Telephone Encounter (Signed)
Notified the pt back to inform her that her left cardiac cath will be scheduled, as requested, for Monday January 12, 2015 at 0730 with Dr Martinique.  Informed the that she must arrive at [facility], Short Stay, [facility], at 0530 the morning of her cath.  Informed the pt that her pre-cath lab appt will be scheduled, as requested, for Wednesday 01/07/15 at our office. We will check a pt/inr, cmet, and cbc w diff. Informed the pt that when she comes in for her 11/9 lab appt, there will be a cath instruction letter for her to pick up at the front desk.  Briefly went over cath instruction letter over the phone with the pt.  Instructed her to continue to take her ASA with no skipped doses, and to hold her HCTZ the morning/day of her scheduled cath. Pt verbalized understanding and agrees with this plan.  Will send Dr Meda Coffee this message to place hospital orders in for cath date of 01/12/15 at 0730 with Dr Martinique.

## 2015-01-07 ENCOUNTER — Other Ambulatory Visit (INDEPENDENT_AMBULATORY_CARE_PROVIDER_SITE_OTHER): Payer: Medicare Other | Admitting: *Deleted

## 2015-01-07 DIAGNOSIS — Z01812 Encounter for preprocedural laboratory examination: Secondary | ICD-10-CM | POA: Diagnosis not present

## 2015-01-07 DIAGNOSIS — Z01810 Encounter for preprocedural cardiovascular examination: Secondary | ICD-10-CM | POA: Diagnosis not present

## 2015-01-07 LAB — CBC WITH DIFFERENTIAL/PLATELET
Basophils Absolute: 0.1 10*3/uL (ref 0.0–0.1)
Basophils Relative: 1 % (ref 0–1)
Eosinophils Absolute: 0.2 10*3/uL (ref 0.0–0.7)
Eosinophils Relative: 2 % (ref 0–5)
HCT: 40.7 % (ref 36.0–46.0)
Hemoglobin: 13.8 g/dL (ref 12.0–15.0)
Lymphocytes Relative: 32 % (ref 12–46)
Lymphs Abs: 3.4 10*3/uL (ref 0.7–4.0)
MCH: 30.2 pg (ref 26.0–34.0)
MCHC: 33.9 g/dL (ref 30.0–36.0)
MCV: 89.1 fL (ref 78.0–100.0)
MPV: 10.5 fL (ref 8.6–12.4)
Monocytes Absolute: 1 10*3/uL (ref 0.1–1.0)
Monocytes Relative: 9 % (ref 3–12)
Neutro Abs: 6 10*3/uL (ref 1.7–7.7)
Neutrophils Relative %: 56 % (ref 43–77)
Platelets: 286 10*3/uL (ref 150–400)
RBC: 4.57 MIL/uL (ref 3.87–5.11)
RDW: 13.4 % (ref 11.5–15.5)
WBC: 10.7 10*3/uL — ABNORMAL HIGH (ref 4.0–10.5)

## 2015-01-07 LAB — COMPREHENSIVE METABOLIC PANEL
ALT: 28 U/L (ref 6–29)
AST: 28 U/L (ref 10–35)
Albumin: 4.4 g/dL (ref 3.6–5.1)
Alkaline Phosphatase: 75 U/L (ref 33–130)
BUN: 25 mg/dL (ref 7–25)
CO2: 26 mmol/L (ref 20–31)
Calcium: 9.8 mg/dL (ref 8.6–10.4)
Chloride: 101 mmol/L (ref 98–110)
Creat: 0.88 mg/dL (ref 0.60–0.93)
Glucose, Bld: 91 mg/dL (ref 65–99)
Potassium: 3.8 mmol/L (ref 3.5–5.3)
Sodium: 137 mmol/L (ref 135–146)
Total Bilirubin: 0.4 mg/dL (ref 0.2–1.2)
Total Protein: 7.2 g/dL (ref 6.1–8.1)

## 2015-01-08 LAB — PROTIME-INR
INR: 0.94 (ref <1.50)
Prothrombin Time: 12.7 seconds (ref 11.6–15.2)

## 2015-01-12 ENCOUNTER — Encounter (HOSPITAL_COMMUNITY): Payer: Self-pay | Admitting: Cardiology

## 2015-01-12 ENCOUNTER — Ambulatory Visit (HOSPITAL_COMMUNITY)
Admission: RE | Admit: 2015-01-12 | Discharge: 2015-01-13 | Disposition: A | Discharge status: Home / Self Care | Payer: Medicare Other | Source: Ambulatory Visit | Attending: Cardiology | Admitting: Cardiology

## 2015-01-12 ENCOUNTER — Encounter (HOSPITAL_COMMUNITY)
Admission: RE | Disposition: A | Discharge status: Home / Self Care | Payer: Medicare Other | Source: Ambulatory Visit | Attending: Cardiology

## 2015-01-12 DIAGNOSIS — I209 Angina pectoris, unspecified: Secondary | ICD-10-CM | POA: Diagnosis present

## 2015-01-12 DIAGNOSIS — E785 Hyperlipidemia, unspecified: Secondary | ICD-10-CM | POA: Diagnosis not present

## 2015-01-12 DIAGNOSIS — I10 Essential (primary) hypertension: Secondary | ICD-10-CM | POA: Insufficient documentation

## 2015-01-12 DIAGNOSIS — Z87891 Personal history of nicotine dependence: Secondary | ICD-10-CM | POA: Insufficient documentation

## 2015-01-12 DIAGNOSIS — I25118 Atherosclerotic heart disease of native coronary artery with other forms of angina pectoris: Secondary | ICD-10-CM | POA: Diagnosis not present

## 2015-01-12 DIAGNOSIS — I2584 Coronary atherosclerosis due to calcified coronary lesion: Secondary | ICD-10-CM | POA: Insufficient documentation

## 2015-01-12 DIAGNOSIS — Z8249 Family history of ischemic heart disease and other diseases of the circulatory system: Secondary | ICD-10-CM | POA: Insufficient documentation

## 2015-01-12 DIAGNOSIS — Z7982 Long term (current) use of aspirin: Secondary | ICD-10-CM | POA: Diagnosis not present

## 2015-01-12 DIAGNOSIS — I2510 Atherosclerotic heart disease of native coronary artery without angina pectoris: Secondary | ICD-10-CM | POA: Diagnosis present

## 2015-01-12 DIAGNOSIS — R9439 Abnormal result of other cardiovascular function study: Secondary | ICD-10-CM

## 2015-01-12 DIAGNOSIS — Z79899 Other long term (current) drug therapy: Secondary | ICD-10-CM | POA: Diagnosis not present

## 2015-01-12 DIAGNOSIS — K219 Gastro-esophageal reflux disease without esophagitis: Secondary | ICD-10-CM | POA: Insufficient documentation

## 2015-01-12 DIAGNOSIS — I739 Peripheral vascular disease, unspecified: Secondary | ICD-10-CM

## 2015-01-12 DIAGNOSIS — I779 Disorder of arteries and arterioles, unspecified: Secondary | ICD-10-CM | POA: Diagnosis present

## 2015-01-12 DIAGNOSIS — I25119 Atherosclerotic heart disease of native coronary artery with unspecified angina pectoris: Secondary | ICD-10-CM | POA: Diagnosis not present

## 2015-01-12 HISTORY — DX: Headache, unspecified: R51.9

## 2015-01-12 HISTORY — DX: Atherosclerotic heart disease of native coronary artery without angina pectoris: I25.10

## 2015-01-12 HISTORY — PX: CARDIAC CATHETERIZATION: SHX172

## 2015-01-12 HISTORY — PX: CORONARY STENT PLACEMENT: SHX1402

## 2015-01-12 HISTORY — DX: Headache: R51

## 2015-01-12 LAB — POCT ACTIVATED CLOTTING TIME: ACTIVATED CLOTTING TIME: 257 s

## 2015-01-12 SURGERY — LEFT HEART CATH AND CORONARY ANGIOGRAPHY
Anesthesia: LOCAL | Laterality: Right

## 2015-01-12 MED ORDER — ONDANSETRON HCL 4 MG/2ML IJ SOLN: 4.0000 mg | Freq: Four times a day (QID) | INTRAMUSCULAR | Status: DC | PRN

## 2015-01-12 MED ORDER — CLOPIDOGREL BISULFATE 75 MG PO TABS
75.0000 mg | ORAL_TABLET | Freq: Every day | ORAL | Status: DC
  Administered 2015-01-13: 75 mg via ORAL
  Filled 2015-01-12 (×2): qty 1

## 2015-01-12 MED ORDER — ACETAMINOPHEN 325 MG PO TABS: 650.0000 mg | ORAL_TABLET | ORAL | Status: DC | PRN

## 2015-01-12 MED ORDER — LIDOCAINE HCL (PF) 1 % IJ SOLN
INTRAMUSCULAR | Status: AC
  Filled 2015-01-12: qty 30

## 2015-01-12 MED ORDER — CLOPIDOGREL BISULFATE 300 MG PO TABS
ORAL_TABLET | ORAL | Status: DC | PRN
  Administered 2015-01-12: 600 mg via ORAL

## 2015-01-12 MED ORDER — MIDAZOLAM HCL 2 MG/2ML IJ SOLN
INTRAMUSCULAR | Status: AC
  Filled 2015-01-12: qty 4

## 2015-01-12 MED ORDER — NITROGLYCERIN 0.4 MG/SPRAY TL SOLN
Status: DC | PRN
  Administered 2015-01-12: 1 via SUBLINGUAL

## 2015-01-12 MED ORDER — NITROGLYCERIN 0.4 MG/SPRAY TL SOLN
Status: AC
  Filled 2015-01-12: qty 4.9

## 2015-01-12 MED ORDER — SIMVASTATIN 20 MG PO TABS
40.0000 mg | ORAL_TABLET | Freq: Every day | ORAL | Status: DC
  Filled 2015-01-12: qty 2

## 2015-01-12 MED ORDER — FENTANYL CITRATE (PF) 100 MCG/2ML IJ SOLN
INTRAMUSCULAR | Status: AC
  Filled 2015-01-12: qty 4

## 2015-01-12 MED ORDER — LEVOTHYROXINE SODIUM 75 MCG PO TABS
75.0000 ug | ORAL_TABLET | Freq: Every day | ORAL | Status: DC
  Administered 2015-01-13: 10:00:00 75 ug via ORAL
  Filled 2015-01-12: qty 1

## 2015-01-12 MED ORDER — OMEGA-3-ACID ETHYL ESTERS 1 G PO CAPS
1.0000 g | ORAL_CAPSULE | Freq: Every day | ORAL | Status: DC
  Administered 2015-01-12 – 2015-01-13 (×2): 1 g via ORAL
  Filled 2015-01-12 (×2): qty 1

## 2015-01-12 MED ORDER — CLOPIDOGREL BISULFATE 300 MG PO TABS
ORAL_TABLET | ORAL | Status: AC
  Filled 2015-01-12: qty 1

## 2015-01-12 MED ORDER — SODIUM CHLORIDE 0.9 % WEIGHT BASED INFUSION: 1.0000 mL/kg/h | INTRAVENOUS | Status: DC

## 2015-01-12 MED ORDER — NITROGLYCERIN 1 MG/10 ML FOR IR/CATH LAB
INTRA_ARTERIAL | Status: AC
  Filled 2015-01-12: qty 10

## 2015-01-12 MED ORDER — HEPARIN SODIUM (PORCINE) 1000 UNIT/ML IJ SOLN
INTRAMUSCULAR | Status: AC
  Filled 2015-01-12: qty 1

## 2015-01-12 MED ORDER — SODIUM CHLORIDE 0.9 % IJ SOLN: 3.0000 mL | INTRAMUSCULAR | Status: DC | PRN

## 2015-01-12 MED ORDER — FENTANYL CITRATE (PF) 100 MCG/2ML IJ SOLN
INTRAMUSCULAR | Status: DC | PRN
  Administered 2015-01-12: 25 ug via INTRAVENOUS
  Administered 2015-01-12: 50 ug via INTRAVENOUS

## 2015-01-12 MED ORDER — ASPIRIN 81 MG PO CHEW: 81.0000 mg | CHEWABLE_TABLET | ORAL | Status: DC

## 2015-01-12 MED ORDER — SODIUM CHLORIDE 0.9 % WEIGHT BASED INFUSION: 3.0000 mL/kg/h | INTRAVENOUS | Status: AC

## 2015-01-12 MED ORDER — PANTOPRAZOLE SODIUM 40 MG PO TBEC
40.0000 mg | DELAYED_RELEASE_TABLET | Freq: Every day | ORAL | Status: DC
  Administered 2015-01-12 – 2015-01-13 (×2): 40 mg via ORAL
  Filled 2015-01-12 (×2): qty 1

## 2015-01-12 MED ORDER — VERAPAMIL HCL 2.5 MG/ML IV SOLN
INTRAVENOUS | Status: AC
  Filled 2015-01-12: qty 2

## 2015-01-12 MED ORDER — HEPARIN (PORCINE) IN NACL 2-0.9 UNIT/ML-% IJ SOLN
INTRAMUSCULAR | Status: AC
  Filled 2015-01-12: qty 500

## 2015-01-12 MED ORDER — SODIUM CHLORIDE 0.9 % WEIGHT BASED INFUSION
3.0000 mL/kg/h | INTRAVENOUS | Status: DC
  Administered 2015-01-12: 3 mL/kg/h via INTRAVENOUS

## 2015-01-12 MED ORDER — SODIUM CHLORIDE 0.9 % IV SOLN
250.0000 mL | INTRAVENOUS | Status: DC | PRN
Start: 2015-01-12 — End: 2015-01-12

## 2015-01-12 MED ORDER — ATORVASTATIN CALCIUM 20 MG PO TABS
20.0000 mg | ORAL_TABLET | Freq: Every day | ORAL | Status: DC
  Administered 2015-01-12: 17:00:00 20 mg via ORAL
  Filled 2015-01-12: qty 1

## 2015-01-12 MED ORDER — IOHEXOL 350 MG/ML SOLN
INTRAVENOUS | Status: DC | PRN
  Administered 2015-01-12: 150 mL via INTRACARDIAC

## 2015-01-12 MED ORDER — LISINOPRIL 40 MG PO TABS
40.0000 mg | ORAL_TABLET | Freq: Every day | ORAL | Status: DC
  Administered 2015-01-12 – 2015-01-13 (×2): 40 mg via ORAL
  Filled 2015-01-12 (×2): qty 1

## 2015-01-12 MED ORDER — MIDAZOLAM HCL 2 MG/2ML IJ SOLN
INTRAMUSCULAR | Status: DC | PRN
Start: 2015-01-12 — End: 2015-01-12
  Administered 2015-01-12: 1 mg via INTRAVENOUS

## 2015-01-12 MED ORDER — FAMOTIDINE IN NACL 20-0.9 MG/50ML-% IV SOLN
INTRAVENOUS | Status: DC | PRN
  Administered 2015-01-12: 20 mg via INTRAVENOUS

## 2015-01-12 MED ORDER — HEPARIN SODIUM (PORCINE) 1000 UNIT/ML IJ SOLN
INTRAMUSCULAR | Status: DC | PRN
  Administered 2015-01-12: 4000 [IU] via INTRAVENOUS
  Administered 2015-01-12: 2000 [IU] via INTRAVENOUS

## 2015-01-12 MED ORDER — SODIUM CHLORIDE 0.9 % IJ SOLN: 3.0000 mL | Freq: Two times a day (BID) | INTRAMUSCULAR | Status: DC

## 2015-01-12 MED ORDER — SODIUM CHLORIDE 0.9 % IV SOLN
INTRAVENOUS | Status: DC | PRN
  Administered 2015-01-12: 250 mL via INTRAVENOUS

## 2015-01-12 MED ORDER — AMLODIPINE BESYLATE 10 MG PO TABS
10.0000 mg | ORAL_TABLET | Freq: Every day | ORAL | Status: DC
  Administered 2015-01-12 – 2015-01-13 (×2): 10 mg via ORAL
  Filled 2015-01-12 (×2): qty 1

## 2015-01-12 MED ORDER — NITROGLYCERIN 1 MG/10 ML FOR IR/CATH LAB
INTRA_ARTERIAL | Status: DC | PRN
  Administered 2015-01-12: 20 ug via INTRACORONARY
  Administered 2015-01-12: 200 ug via INTRACORONARY

## 2015-01-12 MED ORDER — PAROXETINE HCL 20 MG PO TABS
10.0000 mg | ORAL_TABLET | Freq: Every day | ORAL | Status: DC
  Administered 2015-01-12 – 2015-01-13 (×2): 10 mg via ORAL
  Filled 2015-01-12 (×2): qty 1

## 2015-01-12 MED ORDER — HYDROCHLOROTHIAZIDE 25 MG PO TABS
25.0000 mg | ORAL_TABLET | Freq: Every day | ORAL | Status: DC
  Administered 2015-01-12 – 2015-01-13 (×2): 25 mg via ORAL
  Filled 2015-01-12 (×2): qty 1

## 2015-01-12 MED ORDER — VERAPAMIL HCL 2.5 MG/ML IV SOLN
INTRAVENOUS | Status: DC | PRN
  Administered 2015-01-12: 10 mL via INTRA_ARTERIAL

## 2015-01-12 MED ORDER — ASPIRIN EC 81 MG PO TBEC
81.0000 mg | DELAYED_RELEASE_TABLET | Freq: Every day | ORAL | Status: DC
  Administered 2015-01-13: 81 mg via ORAL
  Filled 2015-01-12: qty 1

## 2015-01-12 SURGICAL SUPPLY — 19 items
BALLN EMERGE MR 2.5X15 (BALLOONS) ×3
BALLN ~~LOC~~ EMERGE MR 3.25X15 (BALLOONS) ×3
BALLOON EMERGE MR 2.5X15 (BALLOONS) ×2 IMPLANT
BALLOON ~~LOC~~ EMERGE MR 3.25X15 (BALLOONS) ×2 IMPLANT
CATH INFINITI 5 FR JL3.5 (CATHETERS) ×3 IMPLANT
CATH INFINITI 5FR ANG PIGTAIL (CATHETERS) ×3 IMPLANT
CATH INFINITI JR4 5F (CATHETERS) ×3 IMPLANT
CATH VISTA GUIDE 6FR XBLAD3.5 (CATHETERS) ×3 IMPLANT
DEVICE RAD COMP TR BAND LRG (VASCULAR PRODUCTS) ×3 IMPLANT
GLIDESHEATH SLEND SS 6F .021 (SHEATH) ×3 IMPLANT
KIT ENCORE 26 ADVANTAGE (KITS) ×3 IMPLANT
KIT HEART LEFT (KITS) ×3 IMPLANT
PACK CARDIAC CATHETERIZATION (CUSTOM PROCEDURE TRAY) ×3 IMPLANT
STENT PROMUS PREM MR 3.0X20 (Permanent Stent) ×3 IMPLANT
SYR MEDRAD MARK V 150ML (SYRINGE) ×3 IMPLANT
TRANSDUCER W/STOPCOCK (MISCELLANEOUS) ×3 IMPLANT
TUBING CIL FLEX 10 FLL-RA (TUBING) ×3 IMPLANT
WIRE ASAHI PROWATER 180CM (WIRE) ×3 IMPLANT
WIRE SAFE-T 1.5MM-J .035X260CM (WIRE) ×6 IMPLANT

## 2015-01-12 NOTE — Interval H&P Note (Signed)
History and Physical Interval Note:  01/12/2015 7:30 AM  Ann Wagner  has presented today for surgery, with the diagnosis of Abnormal Stress Test and Abnormal CT  The various methods of treatment have been discussed with the patient and family. After consideration of risks, benefits and other options for treatment, the patient has consented to  Procedure(s): Left Heart Cath and Coronary Angiography (N/A) as a surgical intervention .  The patient's history has been reviewed, patient examined, no change in status, stable for surgery.  I have reviewed the patient's chart and labs.  Questions were answered to the patient's satisfaction.   Cath Lab Visit (complete for each Cath Lab visit)  Clinical Evaluation Leading to the Procedure:   ACS: No.  Non-ACS:    Anginal Classification: CCS II  Anti-ischemic medical therapy: Minimal Therapy (1 class of medications)  Non-Invasive Test Results: Intermediate-risk stress test findings: cardiac mortality 1-3%/year  Prior CABG: No previous CABG       Ann Wagner [facility] 01/12/2015 7:30 AM

## 2015-01-12 NOTE — Progress Notes (Signed)
TR BAND REMOVAL  LOCATION:  right radial  DEFLATED PER PROTOCOL:  Yes.    TIME BAND OFF / DRESSING APPLIED:   1230   SITE UPON ARRIVAL:   Level 0  SITE AFTER BAND REMOVAL:  Level 0  CIRCULATION SENSATION AND MOVEMENT:  Within Normal Limits  Yes.    COMMENTS:    

## 2015-01-12 NOTE — H&P (Signed)
ID: Ann Wagner, DOB 02/17/1946, MRN [id number]  PCP: Annye Asa, MD Cardiologist: Dorothy Spark, MD   Chief cardiology: preventive cardiology, DOE  History of Present Illness: Ann Wagner is a 69 y.o. female who presents for preventive cardiology. She has h/o HTN, hyperlipidemia, family history of CAD, who is coming concerned about potential MI being 75 soon and that's when her parents developed MIs. Her mother had MI at age 54 and died of CHF at age 30. Father had MI in his 64', had stent placed, died of aortic aneurysm at age  The patient is retired, very active, carrying heavy stuff, no symptoms, walks 3x/week 45 minutes. Has h/o vasovagal syncope - 2 syncopal episodes in the last 3 years. While she walks sometimes she has slight chest pain described as sharp. Reports cardiac cath 7 years ago for evaluation of syncope. She denies orthopnea or PND. Recently she had an ETT- walked 9 minutes with some dyspnea. Inconclusive Ecg changes. Cardiac CTA showed a high calcium score with disease in LAD territory. Unable to accurately assess severity of stenosis due to calcium blooming.   Past Medical History  Diagnosis Date  . Arthritis   . History of chicken pox   . Allergic rhinitis   . GERD (gastroesophageal reflux disease)   . Hyperlipidemia   . Hyperthyroidism   . Syncope   . Hypertension     Past Surgical History  Procedure Laterality Date  . Tubal ligation       Current Outpatient Prescriptions  Medication Sig Dispense Refill  . amLODipine (NORVASC) 10 MG tablet TAKE 1 TABLET BY MOUTH EVERY DAY 30 tablet 6  . hydrochlorothiazide (HYDRODIURIL) 25 MG tablet TAKE 1 TABLET BY MOUTH EVERY DAY 30 tablet 6  . levothyroxine (SYNTHROID, LEVOTHROID) 75 MCG tablet TAKE 1 TABLET BY MOUTH EVERY DAY 90 tablet 1  . lisinopril (PRINIVIL,ZESTRIL) 40 MG tablet TAKE 1 TABLET BY MOUTH EVERY DAY 30 tablet 6   . meloxicam (MOBIC) 15 MG tablet Take 1 tablet (15 mg total) by mouth daily. (Patient taking differently: Take 15 mg by mouth as needed. ) 30 tablet 1  . PARoxetine (PAXIL) 10 MG tablet TAKE 1 TABLET BY MOUTH EVERY DAY 90 tablet 1  . simvastatin (ZOCOR) 40 MG tablet TAKE 1 TABLET BY MOUTH AT BEDTIME 30 tablet 6  . Vitamin D, Ergocalciferol, (DRISDOL) 50000 UNITS CAPS capsule Take 1 capsule (50,000 Units total) by mouth every 7 (seven) days. 4 capsule 3   No current facility-administered medications for this visit.    Allergies: Propoxyphene n-acetaminophen    Social History: The patient  reports that she has quit smoking. She does not have any smokeless tobacco history on file. She reports that she drinks alcohol.   Family History: The patient's family history includes Coronary artery disease in her father, mother, and paternal grandmother; Diabetes in her mother; Heart attack in her mother; Hypertension in her father, mother, and paternal grandmother. There is no history of Stroke or Cancer.    ROS: Please see the history of present illness. Otherwise, review of systems are positive for none. All other systems are reviewed and negative.    PHYSICAL EXAM: VS: BP 124/64 mmHg  Pulse 51  Ht 5' 5.5" (1.664 m)  Wt 180 lb (81.647 kg)  BMI 29.49 kg/m2 , BMI Body mass index is 29.49 kg/(m^2). GEN: Well nourished, well developed, in no acute distress  HEENT: normal  Neck: no JVD, carotid bruits, or masses Cardiac: RRR; no murmurs,  rubs, or gallops,no edema  Respiratory: clear to auscultation bilaterally, normal work of breathing GI: soft, nontender, nondistended, + BS MS: no deformity or atrophy  Skin: warm and dry, no rash Neuro: Strength and sensation are intact Psych: euthymic mood, full affect   EKG: EKG is ordered today. The ekg ordered today demonstrates SB- rate 49, otherwise normal ECG   Recent Labs: Lab Results  Component Value Date    WBC 10.7* 01/07/2015   HGB 13.8 01/07/2015   HCT 40.7 01/07/2015   PLT 286 01/07/2015   GLUCOSE 91 01/07/2015   CHOL 167 04/30/2014   TRIG 166.0* 04/30/2014   HDL 42.80 04/30/2014   LDLDIRECT 92.9 12/20/2011   LDLCALC 91 04/30/2014   ALT 28 01/07/2015   AST 28 01/07/2015   NA 137 01/07/2015   K 3.8 01/07/2015   CL 101 01/07/2015   CREATININE 0.88 01/07/2015   BUN 25 01/07/2015   CO2 26 01/07/2015   TSH 1.48 04/30/2014   INR 0.94 01/07/2015   HGBA1C 6.4 08/22/2013      Lipid Panel  Labs (Brief)       Component Value Date/Time   CHOL 167 04/30/2014 1413   TRIG 166.0* 04/30/2014 1413   HDL 42.80 04/30/2014 1413   CHOLHDL 4 04/30/2014 1413   VLDL 33.2 04/30/2014 1413   LDLCALC 91 04/30/2014 1413   LDLDIRECT 92.9 12/20/2011 0924       Wt Readings from Last 3 Encounters:  08/04/14 180 lb (81.647 kg)  04/30/14 180 lb 8 oz (81.874 kg)  08/20/13 179 lb 2 oz (81.251 kg)    ETT 10/133/16: Study Highlights    Borderline abnormal GXT with the patient completing Stage 3 of the Bruce protocol; HR 121 (80% APMHR) and workload of 10.1 mets.  Exercise limited by fatigue and dyspnea without chest pain.  Patient developed nondiagnostic 0.5 - 1 mm ST depression during Stage 3 with frequent PVC's. Duke treadmill score: -3; intermediate risk.     Stress Findings    ECG Baseline ECG exhibits normal sinus rhythm..   Stress Findings The patient exercised following the Bruce protocol.  The patient reported shortness of breath and fatigue during the stress test. The patient experienced no angina during the stress test.   The patient requested the test to be stopped. The test was stopped because the patient complained of fatigue and shortness of breath.   Blood pressure and heart rate demonstrated a normal response to exercise. Overall, the patient's exercise capacity was normal.   Recovery time: 7 minutes.  Duke Treadmill Score:  intermediate risk The patient's response to exercise was adequate for diagnosis.   Response to Stress Arrhythmias during stress: PVCs Occasional to frequent during Stage 3 of exercise .  Arrhythmias during recovery: none.  Arrhythmias were significant.  ECG was interpretable and non-conclusive.    Stress Measurements    Baseline Vitals  Rest HR 59 bpm    Rest BP 145/59 mmHg    Exercise Time  Exercise duration (min) 9 min    Exercise duration (sec) 0 sec    Peak Stress Vitals  Peak HR 121 bpm    Peak BP 190/57 mmHg    Exercise Data  MPHR 150 bpm    Percent HR 80 %    RPE 17     Estimated workload 10.1 METS       ADDENDUM REPORT: 12/26/2014 17:12 CLINICAL DATA: 68 year old female with hypertension, hyperlipidemia, FH of premature CAD and abnormal exercise treadmill stress test. EXAM:  Cardiac/Coronary CT TECHNIQUE: The patient was scanned on a Philips 256 scanner. FINDINGS: A 120 kV prospective scan was triggered in the descending thoracic aorta at 111 HU's. Axial non-contrast 3 mm slices were carried out through the heart. The data set was analyzed on a dedicated work station and scored using the [place]. Gantry rotation speed was 270 msecs and collimation was .9 mm. 5 mg of iv Metoprolol and 0.8 mg of sl NTG was given. The 3D data set was reconstructed in 5% intervals of the 67-82 % of the R-R cycle. Diastolic phases were analyzed on a dedicated work station using MPR, MIP and VRT modes. The patient received 80 cc of contrast. Aorta: Normal caliber, no dissection. Minimal diffuse calcifications in the descending portion. Aortic Valve: Trileaflet. No calcifications. Coronary Arteries: Normal coronary origin. Right dominance. RCA is a large dominant vessel. There is a large calcified plaque in the aorta near the ostium of the RCA that is associated with ostial stenosis 0-25%. Mid and distal RCA have no significant plaque. Proximal PDA has no  plaque, mid and distal portions have small caliber and no obvious plaque. PLA is small. Left main is a large vessel that gives rise to LAD and LCX artery. There is a long calcified lesion that starts in the distal left main artery and and extends into proximal LAD. It is associated with 0-25% stenosis in the distal left main artery. LAD is a large vessel that has a long moderate predominantly calcified plaque in its proximal segment with focal stenosis of 50-69%. Mid LAD has a moderate diffuse circumferential calcified plaque associated with blooming artifact and at least 50-69% stenosis. Distal LAD has no plaque. D1 is a small branch with 50% ostial stenosis. D2 is a medium size vessel with minimal ostial noncalcified plaque. LCX artery is a medium size non-dominant vessel that gives rise to two OM branches. There is minimal noncalcified plaque in proximal and mid LCX. OM1 and OM2 are medium size branches with no significant plaque. IMPRESSION: 1. Coronary calcium score of 580. This was 43 percentile for age and sex matched control. 2. Normal coronary origin. Right dominance. 3. There is diffuse moderate calcified plaque in the proximal and mid LAD with 50-69% stenosis and possibly severe stenosis in its mid portion. Reading obscured by significant calcification and associated blooming artifact. Ena Dawley Electronically Signed  By: Ena Dawley  On: 12/26/2014 17:12     Study Result     EXAM: OVER-READ INTERPRETATION CT CHEST  The following report is an over-read performed by radiologist Dr. Lindaann Slough [facility] on 12/26/2014. This over-read does not include interpretation of cardiac or coronary anatomy or pathology. The coronary CTA interpretation by the cardiologist is attached.  COMPARISON: Chest radiographs 12/13/2011  FINDINGS: No enlarged lymph nodes are demonstrated within the visualized mediastinum. The azygos vein is  mildly prominent. There is a calcified left lower lobe granuloma and mild dependent atelectasis at both lung bases. No suspicious pulmonary findings are present.  The liver appears mildly enlarged with low density consistent with steatosis. There are calcifications within the dome of the right hepatic lobe, likely postinflammatory. No significant osseous findings.  IMPRESSION: 1. No significant findings within the visualized lower chest. Vascular/cardiac findings are dictated separately. 2. Evidence of prior granulomatous disease with calcified left lower lobe pulmonary granuloma and probable hepatic granuloma. 3. Hepatic steatosis.  Electronically Signed: By: Richardean Sale M.D. On: 12/26/2014 12:54      ASSESSMENT AND PLAN:  1. CAD-  class 2 symptoms on one antianginal medication. Inconclusive ETT. Abnormal cardiac CTA.  We will continue aspirin 81 mg po daily, continue zocor, fish oli, metoprolol. Plan for cardiac cath today with possible PCI. The procedure and risks were reviewed including but not limited to death, myocardial infarction, stroke, arrythmias, bleeding, transfusion, emergency surgery, dye allergy, or renal dysfunction. The patient voices understanding and is agreeable to proceed..  2. HTN - controlled on current regimen.  3. Hyperlipidemia - as above, at goal HDL, LDL, TG midldly elevated, for now I would continue the same regimen.   4. H/O vasovagal syncope - on metoprolol, advised to drink plenty of water  Dakarri Kessinger [place] MD, [facility] 01/12/2015 7:24 AM

## 2015-01-13 ENCOUNTER — Other Ambulatory Visit: Payer: Self-pay

## 2015-01-13 ENCOUNTER — Encounter (HOSPITAL_COMMUNITY): Payer: Self-pay | Admitting: Physician Assistant

## 2015-01-13 ENCOUNTER — Other Ambulatory Visit: Payer: Self-pay | Admitting: Physician Assistant

## 2015-01-13 DIAGNOSIS — E785 Hyperlipidemia, unspecified: Secondary | ICD-10-CM | POA: Diagnosis not present

## 2015-01-13 DIAGNOSIS — Z87891 Personal history of nicotine dependence: Secondary | ICD-10-CM | POA: Diagnosis not present

## 2015-01-13 DIAGNOSIS — Z8249 Family history of ischemic heart disease and other diseases of the circulatory system: Secondary | ICD-10-CM | POA: Diagnosis not present

## 2015-01-13 DIAGNOSIS — Z7982 Long term (current) use of aspirin: Secondary | ICD-10-CM | POA: Diagnosis not present

## 2015-01-13 DIAGNOSIS — I25119 Atherosclerotic heart disease of native coronary artery with unspecified angina pectoris: Secondary | ICD-10-CM | POA: Diagnosis not present

## 2015-01-13 DIAGNOSIS — I209 Angina pectoris, unspecified: Secondary | ICD-10-CM | POA: Diagnosis not present

## 2015-01-13 DIAGNOSIS — I10 Essential (primary) hypertension: Secondary | ICD-10-CM | POA: Diagnosis not present

## 2015-01-13 DIAGNOSIS — I25118 Atherosclerotic heart disease of native coronary artery with other forms of angina pectoris: Secondary | ICD-10-CM | POA: Diagnosis not present

## 2015-01-13 DIAGNOSIS — Z79899 Other long term (current) drug therapy: Secondary | ICD-10-CM | POA: Diagnosis not present

## 2015-01-13 DIAGNOSIS — K219 Gastro-esophageal reflux disease without esophagitis: Secondary | ICD-10-CM | POA: Diagnosis not present

## 2015-01-13 DIAGNOSIS — I2584 Coronary atherosclerosis due to calcified coronary lesion: Secondary | ICD-10-CM | POA: Diagnosis not present

## 2015-01-13 LAB — BASIC METABOLIC PANEL
Anion gap: 7 (ref 5–15)
BUN: 14 mg/dL (ref 6–20)
CALCIUM: 9.7 mg/dL (ref 8.9–10.3)
CHLORIDE: 104 mmol/L (ref 101–111)
CO2: 30 mmol/L (ref 22–32)
CREATININE: 0.92 mg/dL (ref 0.44–1.00)
GFR calc Af Amer: >60 mL/min (ref >60)
GFR calc non Af Amer: >60 mL/min (ref >60)
Glucose, Bld: 115 mg/dL — ABNORMAL HIGH (ref 65–99)
Potassium: 4.4 mmol/L (ref 3.5–5.1)
Sodium: 141 mmol/L (ref 135–145)

## 2015-01-13 LAB — CBC
HEMATOCRIT: 41.4 % (ref 36.0–46.0)
Hemoglobin: 14 g/dL (ref 12.0–15.0)
MCH: 30.6 pg (ref 26.0–34.0)
MCHC: 33.8 g/dL (ref 30.0–36.0)
MCV: 90.4 fL (ref 78.0–100.0)
Platelets: 257 10*3/uL (ref 150–400)
RBC: 4.58 MIL/uL (ref 3.87–5.11)
RDW: 13 % (ref 11.5–15.5)
WBC: 9.8 10*3/uL (ref 4.0–10.5)

## 2015-01-13 MED ORDER — CLOPIDOGREL BISULFATE 75 MG PO TABS
75.0000 mg | ORAL_TABLET | Freq: Every day | ORAL | Status: DC
Start: 2015-01-13 — End: 2016-01-13

## 2015-01-13 MED ORDER — ATORVASTATIN CALCIUM 20 MG PO TABS: 20.0000 mg | ORAL_TABLET | Freq: Every day | ORAL | Status: DC

## 2015-01-13 MED ORDER — PANTOPRAZOLE SODIUM 40 MG PO TBEC: 40.0000 mg | DELAYED_RELEASE_TABLET | Freq: Every day | ORAL | Status: DC

## 2015-01-13 MED ORDER — NITROGLYCERIN 0.4 MG SL SUBL: 0.4000 mg | SUBLINGUAL_TABLET | SUBLINGUAL | Status: DC | PRN

## 2015-01-13 MED FILL — Lidocaine HCl Local Preservative Free (PF) Inj 1%: INTRAMUSCULAR | Qty: 30 | Status: AC

## 2015-01-13 MED FILL — Heparin Sodium (Porcine) 2 Unit/ML in Sodium Chloride 0.9%: INTRAMUSCULAR | Qty: 500 | Status: AC

## 2015-01-13 NOTE — Discharge Summary (Signed)
Discharge Summary   Patient ID: Ann Wagner,  MRN: [id number], DOB/AGE: 70-Sep-1947 70 y.o.  Admit date: 01/12/2015 Discharge date: 01/13/2015  Primary Care Provider: Annye Asa Primary Cardiologist: Dr. Meda Coffee  Discharge Diagnoses Principal Problem:   Coronary artery disease involving native coronary artery with angina pectoris [facility]) Active Problems:   Hyperlipidemia   HYPERTENSION, BENIGN ESSENTIAL   Carotid artery disease ([place])   Angina pectoris ([place])   Allergies Allergies  Allergen Reactions  . Propoxyphene N-Acetaminophen Other (See Comments)    Made sick    Procedures  Cardiac catheterization 01/12/2015 Conclusion     Prox RCA lesion, 20% stenosed.  The left ventricular systolic function is normal.  Prox LAD to Mid LAD lesion, 90% stenosed. Post intervention, there is a 0% residual stenosis.  1. Single vessel obstructive CAD 2. Normal LV function 3. Successful stenting of the mid LAD with DES.  Plan: DAPT for one year. Anticipate DC in am.      Hospital Course  The patient is a 69 year old female with PMH of hypertension, hyperlipidemia, and history of vasovagal syncope. She recently had abnormal cardiac CTA for class II angina on 12/18/2014 which showed a calcium score 580, diffuse prox to mid LAD lesion, reading obsecured by significant calcification and associated blooming artifact. Given her symptom and risk factors, she also was concerned of her family history of CAD in her parents who had first MI at around 75s, she agreed to undergo diagnostic cardiac catheterization.  She presented for the procedure on 01/12/2015 which showed 20% prox RCA lesion, 90% prox to mid LAD lesion treated with DES (PROMUS PREM MR 3.0X20), EF 65%. Post procedure, she was placed on Plavix. He is not on beta blocker likely related to baseline bradycardia. Per pharmacy recommendation, combination of amlodipine and Zocor can increase chance of rhabdo, therefore she was  transitioned to 20 mg Lipitor.  She was seen the morning of 01/13/2015, which time she denies any significant chest discomfort or shortness breath. Her right radial cath site appears to be stable without significant bleeding or hematoma. She has been seen by cardiac rehabilitation nurse and ambulated without discomfort. She has been referred for outpatient cardiac rehabilitation. Emphasis has been placed on compliance with dual antiplatelets medication. Outpatient follow-up has been arranged for the week of December 12 after she returned from out of town visit. She has been instructed not to lift anything greater than 5 pounds for one week. Her Prilosec has been switched to Protonix given interaction with Plavix.   Discharge Vitals Blood pressure 131/40, pulse 48, temperature 97.8 F (36.6 C), temperature source Oral, resp. rate 12, height 5\' 6"  (1.676 m), weight 174 lb 13.2 oz (79.3 kg), SpO2 95 %.  Filed Weights   01/12/15 0546 01/13/15 0341  Weight: 174 lb (78.926 kg) 174 lb 13.2 oz (79.3 kg)    Labs  CBC  Recent Labs  01/13/15 0550  WBC 9.8  HGB 14.0  HCT 41.4  MCV 90.4  PLT 99991111   Basic Metabolic Panel  Recent Labs  01/13/15 0550  NA 141  K 4.4  CL 104  CO2 30  GLUCOSE 115*  BUN 14  CREATININE 0.92  CALCIUM 9.7    Disposition  Pt is being discharged home today in good condition.  Follow-up Plans & Appointments      Follow-up Information    Follow up with [facility], NP On 02/11/2015.   Specialties:  Cardiology, Radiology   Why:  11:30pm. Cardiology followup  Contact information:   [address] [place] [address] [phone]       Discharge Medications    Medication List    STOP taking these medications        omeprazole 20 MG tablet  Commonly known as:  PRILOSEC OTC     simvastatin 40 MG tablet  Commonly known as:  ZOCOR     Vitamin D (Ergocalciferol) 50000 UNITS Caps capsule  Commonly known as:  DRISDOL      TAKE  these medications        amLODipine 10 MG tablet  Commonly known as:  NORVASC  TAKE 1 TABLET BY MOUTH EVERY DAY     aspirin EC 81 MG tablet  Take 1 tablet (81 mg total) by mouth daily.     atorvastatin 20 MG tablet  Commonly known as:  LIPITOR  Take 1 tablet (20 mg total) by mouth daily at 6 PM.     clopidogrel 75 MG tablet  Commonly known as:  PLAVIX  Take 1 tablet (75 mg total) by mouth daily with breakfast.     FISH OIL PO  Take 1 capsule by mouth daily.     hydrochlorothiazide 25 MG tablet  Commonly known as:  HYDRODIURIL  TAKE 1 TABLET BY MOUTH EVERY DAY     levothyroxine 75 MCG tablet  Commonly known as:  SYNTHROID, LEVOTHROID  TAKE 1 TABLET BY MOUTH EVERY DAY     lisinopril 40 MG tablet  Commonly known as:  PRINIVIL,ZESTRIL  TAKE 1 TABLET BY MOUTH EVERY DAY     meloxicam 15 MG tablet  Commonly known as:  MOBIC  Take 1 tablet (15 mg total) by mouth daily.     nitroGLYCERIN 0.4 MG SL tablet  Commonly known as:  NITROSTAT  Place 1 tablet (0.4 mg total) under the tongue every 5 (five) minutes as needed for chest pain.     pantoprazole 40 MG tablet  Commonly known as:  PROTONIX  Take 1 tablet (40 mg total) by mouth daily at 12 noon.     PARoxetine 10 MG tablet  Commonly known as:  PAXIL  TAKE 1 TABLET BY MOUTH EVERY DAY        Duration of Discharge Encounter   Greater than 30 minutes including physician time.  Hilbert Corrigan PA-C Pager: [phone] 01/13/2015, 11:59 AM

## 2015-01-13 NOTE — Progress Notes (Signed)
Patient Name: Ann Wagner Date of Encounter: 01/13/2015  Primary Cardiologist: Dr. Meda Coffee   Principal Problem:   Coronary artery disease involving native coronary artery with angina pectoris [facility]) Active Problems:   Hyperlipidemia   HYPERTENSION, BENIGN ESSENTIAL   Carotid artery disease ([place])   Angina pectoris ([place])    SUBJECTIVE  Denies any discomfort. R wrist cath site has not had any problem last night. States she smoked cigarette when she was young, however she has quit a long time ago. Very grateful to finding the lesion. Denies any CP or SOB.   CURRENT MEDS . amLODipine  10 mg Oral Daily  . aspirin EC  81 mg Oral Daily  . atorvastatin  20 mg Oral q1800  . clopidogrel  75 mg Oral Q breakfast  . hydrochlorothiazide  25 mg Oral Daily  . levothyroxine  75 mcg Oral QAC breakfast  . lisinopril  40 mg Oral Daily  . omega-3 acid ethyl esters  1 g Oral Daily  . pantoprazole  40 mg Oral Q1200  . PARoxetine  10 mg Oral Daily    OBJECTIVE  Filed Vitals:   01/12/15 1200 01/12/15 1655 01/12/15 1947 01/13/15 0341  BP: 133/33 148/44 123/33 131/40  Pulse: 51 48 52 48  Temp: 97.7 F (36.5 C) 98 F (36.7 C) 98.6 F (37 C) 97.8 F (36.6 C)  TempSrc: Oral Oral Oral Oral  Resp: 15 16 15 12   Height:      Weight:    174 lb 13.2 oz (79.3 kg)  SpO2: 94% 95% 95% 95%    Intake/Output Summary (Last 24 hours) at 01/13/15 0640 Last data filed at 01/13/15 0341  Gross per 24 hour  Intake 1426.8 ml  Output   2250 ml  Net -823.2 ml   Filed Weights   01/12/15 0546 01/13/15 0341  Weight: 174 lb (78.926 kg) 174 lb 13.2 oz (79.3 kg)    PHYSICAL EXAM  General: Pleasant, NAD. Neuro: Alert and oriented X 3. Moves all extremities spontaneously. Psych: Normal affect. HEENT:  Normal  Neck: Supple without bruits or JVD. Lungs:  Resp regular and unlabored, CTA. Heart: RRR no s3, s4, or murmurs. R radial cath site stable, no hematoma or bleeding Abdomen: Soft, non-tender,  non-distended, BS + x 4.  Extremities: No clubbing, cyanosis or edema. DP/PT/Radials 2+ and equal bilaterally.  Accessory Clinical Findings  CBC  Recent Labs  01/13/15 0550  WBC 9.8  HGB 14.0  HCT 41.4  MCV 90.4  PLT 257   TELE NSR with HR 50s, occasionally in 40s    ECG  Normal EKG  Radiology/Studies  Ct Heart Morp W/cta Cor W/score W/ca W/cm &/or Wo/cm  12/26/2014  ADDENDUM REPORT: 12/26/2014 17:12 CLINICAL DATA:  69 year old female with hypertension, hyperlipidemia, FH of premature CAD and abnormal exercise treadmill stress test. EXAM: Cardiac/Coronary  CT TECHNIQUE: The patient was scanned on a Philips 256 scanner. FINDINGS: A 120 kV prospective scan was triggered in the descending thoracic aorta at 111 HU's. Axial non-contrast 3 mm slices were carried out through the heart. The data set was analyzed on a dedicated work station and scored using the [place]. Gantry rotation speed was 270 msecs and collimation was .9 mm. 5 mg of iv Metoprolol and 0.8 mg of sl NTG was given. The 3D data set was reconstructed in 5% intervals of the 67-82 % of the R-R cycle. Diastolic phases were analyzed on a dedicated work station using MPR, MIP and VRT modes. The  patient received 80 cc of contrast. Aorta: Normal caliber, no dissection. Minimal diffuse calcifications in the descending portion. Aortic Valve:  Trileaflet.  No calcifications. Coronary Arteries:  Normal coronary origin.  Right dominance. RCA is a large dominant vessel. There is a large calcified plaque in the aorta near the ostium of the RCA that is associated with ostial stenosis 0-25%. Mid and distal RCA have no significant plaque. Proximal PDA has no plaque, mid and distal portions have small caliber and no obvious plaque. PLA is small. Left main is a large vessel that gives rise to LAD and LCX artery. There is a long calcified lesion that starts in the distal left main artery and and extends into proximal LAD. It is associated  with 0-25% stenosis in the distal left main artery. LAD is a large vessel that has a long moderate predominantly calcified plaque in its proximal segment with focal stenosis of 50-69%. Mid LAD has a moderate diffuse circumferential calcified plaque associated with blooming artifact and at least 50-69% stenosis. Distal LAD has no plaque. D1 is a small branch with 50% ostial stenosis. D2 is a medium size vessel with minimal ostial noncalcified plaque. LCX artery is a medium size non-dominant vessel that gives rise to two OM branches. There is minimal noncalcified plaque in proximal and mid LCX. OM1 and OM2 are medium size branches with no significant plaque. IMPRESSION: 1. Coronary calcium score of 580. This was 21 percentile for age and sex matched control. 2. Normal coronary origin.  Right dominance. 3. There is diffuse moderate calcified plaque in the proximal and mid LAD with 50-69% stenosis and possibly severe stenosis in its mid portion. Reading obscured by significant calcification and associated blooming artifact. Ena Dawley Electronically Signed   By: Ena Dawley   On: 12/26/2014 17:12  12/26/2014  EXAM: OVER-READ INTERPRETATION  CT CHEST The following report is an over-read performed by radiologist Dr. Lindaann Slough [facility] on 12/26/2014. This over-read does not include interpretation of cardiac or coronary anatomy or pathology. The coronary CTA interpretation by the cardiologist is attached. COMPARISON:  Chest radiographs 12/13/2011 FINDINGS: No enlarged lymph nodes are demonstrated within the visualized mediastinum. The azygos vein is mildly prominent. There is a calcified left lower lobe granuloma and mild dependent atelectasis at both lung bases. No suspicious pulmonary findings are present. The liver appears mildly enlarged with low density consistent with steatosis. There are calcifications within the dome of the right hepatic lobe, likely postinflammatory. No  significant osseous findings. IMPRESSION: 1. No significant findings within the visualized lower chest. Vascular/cardiac findings are dictated separately. 2. Evidence of prior granulomatous disease with calcified left lower lobe pulmonary granuloma and probable hepatic granuloma. 3. Hepatic steatosis. Electronically Signed: By: Richardean Sale M.D. On: 12/26/2014 12:54     ASSESSMENT AND PLAN  1. Abnormal cardiac CTA for class II angina  - coronary CTA 12/26/2014, calcium score 580, diffuse prox to mid LAD lesion, reading obsecured by significant calcification and associated blooming artifact  - cath 01/12/2015 20% prox RCA lesion, 90% prox to mid LAD lesion treated with DES (PROMUS PREM MR 3.0X20), EF 65%  - continue ASA, plavix. Not on BB, likely related to baseline bradycardia. Zocor has been switched to low dose lipitor.  - stable for discharge.  2. CAD  3. HTN: on lisinopril 40mg , norvasc 10mg  and HCTZ 25mg . SBP 120-140s overnight, will continue current med for now, may increase HCTZ to 50mg  if BP still high on followup  4. HLD  5. H/o vasovagal syncope  Signed, Woodward Ku Pager: [phone]   The patient was seen, examined and discussed with Almyra Deforest, PA-C and I agree with the above.   69 year old female with abnormal coronary CTA --> underwent a left cardiac cath yesterday that showed 90% mid LAD stenosis --> s/p PCI/DES.  On ASA, Plavix, lisinopril, amlodipine, atorvastatin. We will arrange for an outpatient follow up.  Dorothy Spark 01/13/2015

## 2015-01-13 NOTE — Discharge Instructions (Addendum)
No driving for 24 hours. No lifting over 5 lbs for 1 week. No sexual activity for 1 week. Keep procedure site clean & dry. If you notice increased pain, swelling, bleeding or pus, call/return!  You may shower, but no soaking baths/hot tubs/pools for 1 week.   MEDICINES TO STOP TAKING:  STOP TAKING PRILOSEC.  STOP TAKING ZOCOR.

## 2015-01-13 NOTE — Progress Notes (Signed)
CARDIAC REHAB PHASE I   PRE:  Rate/Rhythm: 50 SB  BP:  Sitting: 132/55        SaO2: 96 RA  MODE:  Ambulation: 1000 ft   POST:  Rate/Rhythm: 62 SR  BP:  Sitting: 132/55         SaO2: 98 RA  Pt ambulated 1000 ft on RA, independent, steady gait, tolerated well.  Pt denies cp, dizziness, DOE, declined rest stop. Completed PCI/stent education.  Reviewed risk factors, anti-platelet therapy, stent card, activity restrictions, ntg, exercise, heart healthy diet and phase 2 cardiac rehab. Pt verbalized understanding. Pt very pleasant, receptive to education. Pt agrees to phase 2 cardiac rehab referral, will send to [facility]. Pt to recliner after walk, call bell within reach.  [id number] Lenna Sciara, RN, BSN 01/13/2015 8:48 AM

## 2015-01-14 ENCOUNTER — Other Ambulatory Visit: Payer: Self-pay | Admitting: Family Medicine

## 2015-01-15 NOTE — Telephone Encounter (Signed)
Medication filled to pharmacy as requested.   

## 2015-01-27 ENCOUNTER — Telehealth (HOSPITAL_COMMUNITY): Payer: Self-pay | Admitting: Cardiac Rehabilitation

## 2015-01-27 NOTE — Telephone Encounter (Signed)
Phone call to pt to enroll in cardiac rehab. Pt declined service.  She is currently in [place] visiting family and not scheduled to return for a few weeks.  ot declined cardiac rehab.  Pt is member of [employer] and plans to continue exercising on her own.

## 2015-02-03 ENCOUNTER — Encounter: Payer: Self-pay | Admitting: Physician Assistant

## 2015-02-11 ENCOUNTER — Encounter: Payer: Medicare Other | Admitting: Cardiology

## 2015-02-18 ENCOUNTER — Other Ambulatory Visit: Payer: Self-pay | Admitting: Family Medicine

## 2015-02-18 MED ORDER — LEVOTHYROXINE SODIUM 75 MCG PO TABS: 75.0000 ug | ORAL_TABLET | Freq: Every day | ORAL | Status: DC

## 2015-02-18 NOTE — Telephone Encounter (Signed)
90 day rx denied, pt needs an appt with pcp first.

## 2015-02-18 NOTE — Telephone Encounter (Signed)
Med filled #30 only. Pt need a BP and cholesterol follow up for any refills.

## 2015-02-19 ENCOUNTER — Other Ambulatory Visit: Payer: Self-pay | Admitting: Family Medicine

## 2015-02-20 ENCOUNTER — Encounter: Payer: Self-pay | Admitting: Nurse Practitioner

## 2015-02-20 ENCOUNTER — Ambulatory Visit (INDEPENDENT_AMBULATORY_CARE_PROVIDER_SITE_OTHER): Payer: Medicare Other | Admitting: Nurse Practitioner

## 2015-02-20 VITALS — BP 138/72 | HR 55 | Ht 66.0 in | Wt 173.8 lb

## 2015-02-20 DIAGNOSIS — I10 Essential (primary) hypertension: Secondary | ICD-10-CM

## 2015-02-20 DIAGNOSIS — Z955 Presence of coronary angioplasty implant and graft: Secondary | ICD-10-CM | POA: Diagnosis not present

## 2015-02-20 DIAGNOSIS — I259 Chronic ischemic heart disease, unspecified: Secondary | ICD-10-CM

## 2015-02-20 LAB — CBC
HCT: 42.2 % (ref 36.0–46.0)
Hemoglobin: 14.9 g/dL (ref 12.0–15.0)
MCH: 31.1 pg (ref 26.0–34.0)
MCHC: 35.3 g/dL (ref 30.0–36.0)
MCV: 88.1 fL (ref 78.0–100.0)
MPV: 10.5 fL (ref 8.6–12.4)
Platelets: 291 10*3/uL (ref 150–400)
RBC: 4.79 MIL/uL (ref 3.87–5.11)
RDW: 13.1 % (ref 11.5–15.5)
WBC: 7.9 10*3/uL (ref 4.0–10.5)

## 2015-02-20 LAB — BASIC METABOLIC PANEL
BUN: 19 mg/dL (ref 7–25)
CO2: 27 mmol/L (ref 20–31)
Calcium: 10.2 mg/dL (ref 8.6–10.4)
Chloride: 102 mmol/L (ref 98–110)
Creat: 0.91 mg/dL (ref 0.60–0.93)
Glucose, Bld: 101 mg/dL — ABNORMAL HIGH (ref 65–99)
Potassium: 4.3 mmol/L (ref 3.5–5.3)
Sodium: 139 mmol/L (ref 135–146)

## 2015-02-20 LAB — LIPID PANEL
Cholesterol: 162 mg/dL (ref 125–200)
HDL: 40 mg/dL — ABNORMAL LOW (ref >=46)
LDL Cholesterol: 88 mg/dL (ref <130)
Total CHOL/HDL Ratio: 4.1 Ratio (ref <=5.0)
Triglycerides: 169 mg/dL — ABNORMAL HIGH (ref <150)
VLDL: 34 mg/dL — ABNORMAL HIGH (ref <30)

## 2015-02-20 LAB — HEPATIC FUNCTION PANEL
ALT: 30 U/L — ABNORMAL HIGH (ref 6–29)
AST: 25 U/L (ref 10–35)
Albumin: 4.7 g/dL (ref 3.6–5.1)
Alkaline Phosphatase: 85 U/L (ref 33–130)
Bilirubin, Direct: 0.1 mg/dL (ref <=0.2)
Indirect Bilirubin: 0.5 mg/dL (ref 0.2–1.2)
Total Bilirubin: 0.6 mg/dL (ref 0.2–1.2)
Total Protein: 7.5 g/dL (ref 6.1–8.1)

## 2015-02-20 MED ORDER — HYDROCHLOROTHIAZIDE 25 MG PO TABS: 25.0000 mg | ORAL_TABLET | Freq: Every day | ORAL | Status: DC

## 2015-02-20 NOTE — Progress Notes (Signed)
CARDIOLOGY OFFICE NOTE  Date:  02/20/2015    Ann Wagner Date of Birth: [birth year] Medical Record [id number]  PCP:  Annye Asa, MD  Cardiologist:  Meda Coffee    Chief Complaint  Patient presents with  . Coronary Artery Disease    Post hospital visit - seen for Dr. Meda Coffee    History of Present Illness: Ann Wagner is a 69 y.o. female who presents today for a post hospital visit. Seen for Dr. Meda Coffee.   She has a PMH of hypertension, hyperlipidemia, and history of vasovagal syncope. As she was turning 79, she wished to get a cardiac evaluation due to her family history. Abnormal GXT which led to cardiac CTA  which showed a calcium score 580, diffuse prox to mid LAD lesion, reading obsecured by significant calcification and associated blooming artifact. She was referred on for cardiac cath. Has not really had much in the way of symptoms.   She presented for the procedure on 01/12/2015 which showed 20% prox RCA lesion, 90% prox to mid LAD lesion treated with DES (PROMUS PREM MR 3.0X20), EF 65%. Post procedure, she was placed on Plavix. She was transitioned to 20 mg Lipitor due to her other medicines. Discharged without incident.   Comes back today. Here alone. Says she "feels great". She used to be troubled with swelling - this is now resolved. No chest pain - but never had. She has been traveling. Walking regularly without issue. Little "twinge" in her chest at times - very fleeting - nothing that really she thought she should be alarmed about. Feels ok on her current medicines. Energy level is good. Not short of breath.   Past Medical History  Diagnosis Date  . Arthritis   . History of chicken pox   . Allergic rhinitis   . GERD (gastroesophageal reflux disease)   . Hyperlipidemia   . Hyperthyroidism   . Syncope   . Hypertension   . Coronary artery disease     cath 01/12/2015 single vessel CAD, DES to mid LAD  . Headache     Past Surgical History  Procedure  Laterality Date  . Tubal ligation    . Cardiac catheterization N/A 01/12/2015    Procedure: Left Heart Cath and Coronary Angiography;  Surgeon: Peter M Martinique, MD;  Location: [facility];  Service: Cardiovascular;  Laterality: N/A;  . Cardiac catheterization Right 01/12/2015    Procedure: Coronary Stent Intervention;  Surgeon: Peter M Martinique, MD;  Location: [place];  Service: Cardiovascular;  Laterality: Right;  . Coronary stent placement  01/12/2015    des to lad     Medications: Current Outpatient Prescriptions  Medication Sig Dispense Refill  . amLODipine (NORVASC) 10 MG tablet TAKE 1 TABLET BY MOUTH EVERY DAY 90 tablet 0  . aspirin EC 81 MG tablet Take 1 tablet (81 mg total) by mouth daily.    Marland Kitchen atorvastatin (LIPITOR) 20 MG tablet Take 1 tablet (20 mg total) by mouth daily at 6 PM. 30 tablet 11  . clopidogrel (PLAVIX) 75 MG tablet Take 1 tablet (75 mg total) by mouth daily with breakfast. 90 tablet 3  . hydrochlorothiazide (HYDRODIURIL) 25 MG tablet Take 1 tablet (25 mg total) by mouth daily. 90 tablet 3  . levothyroxine (SYNTHROID, LEVOTHROID) 75 MCG tablet Take 1 tablet (75 mcg total) by mouth daily. 30 tablet 0  . lisinopril (PRINIVIL,ZESTRIL) 40 MG tablet TAKE 1 TABLET BY MOUTH EVERY DAY 90 tablet 1  .  nitroGLYCERIN (NITROSTAT) 0.4 MG SL tablet PLACE 1 TABLET UNDER THE TONGUE EVERY 5 MINUTES AS NEEDED FOR CHEST PAIN 25 tablet 1  . Omega-3 Fatty Acids (FISH OIL PO) Take 1 capsule by mouth daily.    . pantoprazole (PROTONIX) 40 MG tablet Take 1 tablet (40 mg total) by mouth daily at 12 noon. 30 tablet 11  . PARoxetine (PAXIL) 10 MG tablet TAKE 1 TABLET BY MOUTH EVERY DAY 90 tablet 0   No current facility-administered medications for this visit.    Allergies: Allergies  Allergen Reactions  . Propoxyphene N-Acetaminophen Other (See Comments)    Made sick    Social History: The patient  reports that she has quit smoking. She has never used smokeless tobacco.  She reports that she drinks alcohol. She reports that she does not use illicit drugs.   Family History: The patient's family history includes Coronary artery disease in her father, mother, and paternal grandmother; Diabetes in her mother; Heart attack in her mother; Hypertension in her father, mother, and paternal grandmother. There is no history of Stroke or Cancer.   Review of Systems: Please see the history of present illness.   Otherwise, the review of systems is positive for none.   All other systems are reviewed and negative.   Physical Exam: VS:  BP 138/72 mmHg  Pulse 55  Ht 5\' 6"  (1.676 m)  Wt 173 lb 12.8 oz (78.835 kg)  BMI 28.07 kg/m2  SpO2 97% .  BMI Body mass index is 28.07 kg/(m^2).  Wt Readings from Last 3 Encounters:  02/20/15 173 lb 12.8 oz (78.835 kg)  01/13/15 174 lb 13.2 oz (79.3 kg)  08/04/14 180 lb (81.647 kg)    General: Pleasant. She looks younger than her stated age. She is alert and in no acute distress.  HEENT: Normal. Neck: Supple, no JVD, carotid bruits, or masses noted.  Cardiac: Regular rate and rhythm. No murmurs, rubs, or gallops. No edema.  Respiratory:  Lungs are clear to auscultation bilaterally with normal work of breathing.  GI: Soft and nontender.  MS: No deformity or atrophy. Gait and ROM intact. Skin: Warm and dry. Color is normal.  Neuro:  Strength and sensation are intact and no gross focal deficits noted.  Psych: Alert, appropriate and with normal affect.   LABORATORY DATA:  EKG:  EKG is not ordered today.  Lab Results  Component Value Date   WBC 9.8 01/13/2015   HGB 14.0 01/13/2015   HCT 41.4 01/13/2015   PLT 257 01/13/2015   GLUCOSE 115* 01/13/2015   CHOL 167 04/30/2014   TRIG 166.0* 04/30/2014   HDL 42.80 04/30/2014   LDLDIRECT 92.9 12/20/2011   LDLCALC 91 04/30/2014   ALT 28 01/07/2015   AST 28 01/07/2015   NA 141 01/13/2015   K 4.4 01/13/2015   CL 104 01/13/2015   CREATININE 0.92 01/13/2015   BUN 14 01/13/2015    CO2 30 01/13/2015   TSH 1.48 04/30/2014   INR 0.94 01/07/2015   HGBA1C 6.4 08/22/2013    BNP (last 3 results) No results for input(s): BNP in the last 8760 hours.  ProBNP (last 3 results) No results for input(s): PROBNP in the last 8760 hours.   Other Studies Reviewed Today:  Cardiac catheterization 01/12/2015 Conclusion     Prox RCA lesion, 20% stenosed.  The left ventricular systolic function is normal.  Prox LAD to Mid LAD lesion, 90% stenosed. Post intervention, there is a 0% residual stenosis.  1. Single vessel obstructive  CAD 2. Normal LV function 3. Successful stenting of the mid LAD with DES.  Plan: DAPT for one year. Anticipate DC in am.          Assessment/Plan: 1. CAD with abnormal GXT/cardiac CT - s/p cath with PCI for single vessel CAD - on DAPT for one year. Doing very well clinically. Discussed need for CV risk factor modification. She is quite motivated.   2. HLD - now on Lipitor - rechecking labs today  3. HTN - BP is good on her current regimen. I refilled her HCTZ today.   4. GERD - on Protonix without issue.   5. History of swelling - now resolved.   Current medicines are reviewed with the patient today.  The patient does not have concerns regarding medicines other than what has been noted above.  The following changes have been made:  See above.  Labs/ tests ordered today include:    Orders Placed This Encounter  Procedures  . Basic metabolic panel  . CBC  . Hepatic function panel  . Lipid panel     Disposition:   FU with Dr. Meda Coffee in 3 months.   Patient is agreeable to this plan and will call if any problems develop in the interim.   Signed: Burtis Junes, RN, ANP-C 02/20/2015 12:36 PM  [facility] [address] [place], [place]  [address] Phone: [phone] Fax: [phone]

## 2015-02-20 NOTE — Patient Instructions (Addendum)
We will be checking the following labs today - BMET, lipids, HPF and CBC   Medication Instructions:    Continue with your current medicines.     Testing/Procedures To Be Arranged:  N/A  Follow-Up:   See Dr. Meda Coffee in 3 months    Other Special Instructions:   Try to limit your use of Ibuprofen  Regular exercise - 45 minutes per day    If you need a refill on your cardiac medications before your next appointment, please call your pharmacy.   Call the [place] office at [phone] if you have any questions, problems or concerns.

## 2015-02-24 ENCOUNTER — Telehealth: Payer: Self-pay | Admitting: Cardiology

## 2015-02-24 NOTE — Telephone Encounter (Signed)
[phone] pt rtn call re test results

## 2015-02-24 NOTE — Telephone Encounter (Signed)
Notes Recorded by Burtis Junes, NP on 02/24/2015 at 9:29 AM Ok to report. Labs look good - would stay on current regimen.  Notified the pt of labs and to continue on current med regimen per Truitt Merle NP.  Pt verbalized understanding and agrees with this plan.

## 2015-03-18 ENCOUNTER — Telehealth: Payer: Self-pay | Admitting: Family Medicine

## 2015-03-18 ENCOUNTER — Other Ambulatory Visit: Payer: Self-pay

## 2015-03-18 ENCOUNTER — Encounter: Payer: Self-pay | Admitting: Gastroenterology

## 2015-03-18 MED ORDER — LEVOTHYROXINE SODIUM 75 MCG PO TABS: 75.0000 ug | ORAL_TABLET | Freq: Every day | ORAL | Status: DC

## 2015-03-18 NOTE — Telephone Encounter (Signed)
Relation to [id number] Call back number:[phone] Pharmacy: [id number] - [place], [place] [address] [phone] (Phone) [phone] (Fax)         Reason for call:  Patient requesting a refill levothyroxine (SYNTHROID, LEVOTHROID) 75 MCG tablet, patient states she had labs done with her cardiologist Dr. Ottie Glazier and states an appointment with PCP would not be necessary due to PCP referencing cardiologist lab results . Please advise

## 2015-03-18 NOTE — Telephone Encounter (Signed)
Refull sent for approval

## 2015-05-12 ENCOUNTER — Other Ambulatory Visit: Payer: Self-pay | Admitting: Family Medicine

## 2015-05-12 ENCOUNTER — Telehealth: Payer: Self-pay | Admitting: Family Medicine

## 2015-05-12 NOTE — Telephone Encounter (Signed)
Patient received flu shot 11/2014 at  [facility] - [place], [place] [phone] (Phone) [phone] (Fax)

## 2015-05-12 NOTE — Telephone Encounter (Signed)
Medication filled to pharmacy as requested.   

## 2015-05-13 ENCOUNTER — Encounter: Payer: Self-pay | Admitting: Family Medicine

## 2015-05-13 ENCOUNTER — Ambulatory Visit (INDEPENDENT_AMBULATORY_CARE_PROVIDER_SITE_OTHER): Payer: Medicare Other | Admitting: Family Medicine

## 2015-05-13 VITALS — BP 128/78 | HR 86 | Temp 98.6°F | Resp 17 | Wt 172.1 lb

## 2015-05-13 DIAGNOSIS — J0100 Acute maxillary sinusitis, unspecified: Secondary | ICD-10-CM

## 2015-05-13 MED ORDER — AMOXICILLIN 875 MG PO TABS: 875.0000 mg | ORAL_TABLET | Freq: Two times a day (BID) | ORAL | Status: DC

## 2015-05-13 NOTE — Patient Instructions (Signed)
Follow up as needed Start the Amoxicillin twice daily- take w/ food Drink plenty of fluids Start the Claritin/Zyrtec daily for the allergy component Continue the Mucinex to thin your congestion Coricidin as needed for cough REST! Call with any questions or concerns If you want to join [place] at the new [place] office, any scheduled appointments will automatically transfer and we will see you at [address] [place] [address] [place], [place] [address] ([place] 3/23) Amelia in there!!!

## 2015-05-13 NOTE — Telephone Encounter (Signed)
Chart updated to reflect 

## 2015-05-13 NOTE — Progress Notes (Signed)
   Subjective:    Patient ID: Ann Wagner, female    DOB: October 17, 1945, 70 y.o.   MRN: [id number]  HPI URI- sxs started Monday w/ nasal congestion.  Having dry, unproductive cough.  Having CP w/ cough.  + sinus pressure.  Bilateral ear fullness.  Denies SOB, wheezing, fever, itchy eyes/nose.  Tried OTC tylenol and mucinex w/ some temporary relief.  No known sick contacts.     Review of Systems For ROS see HPI     Objective:   Physical Exam  Constitutional: She appears well-developed and well-nourished. No distress.  HENT:  Head: Normocephalic and atraumatic.  Right Ear: Tympanic membrane normal.  Left Ear: Tympanic membrane normal.  Nose: Mucosal edema and rhinorrhea present. Right sinus exhibits maxillary sinus tenderness and frontal sinus tenderness. Left sinus exhibits maxillary sinus tenderness and frontal sinus tenderness.  Mouth/Throat: Uvula is midline and mucous membranes are normal. Posterior oropharyngeal erythema present. No oropharyngeal exudate.  Eyes: Conjunctivae and EOM are normal. Pupils are equal, round, and reactive to light.  Neck: Normal range of motion. Neck supple.  Cardiovascular: Normal rate, regular rhythm and normal heart sounds.   Pulmonary/Chest: Effort normal and breath sounds normal. No respiratory distress. She has no wheezes.  Lymphadenopathy:    She has no cervical adenopathy.  Vitals reviewed.         Assessment & Plan:

## 2015-05-13 NOTE — Assessment & Plan Note (Signed)
Pt's sxs and PE consistent w/ infxn.  Start abx.  Cough meds prn.  Reviewed supportive care and red flags that should prompt return.  Pt expressed understanding and is in agreement w/ plan.  

## 2015-05-13 NOTE — Progress Notes (Signed)
Pre visit review using our clinic review tool, if applicable. No additional management support is needed unless otherwise documented below in the visit note. 

## 2015-05-20 ENCOUNTER — Telehealth: Payer: Self-pay | Admitting: Cardiology

## 2015-05-20 NOTE — Telephone Encounter (Signed)
°  New Prob   Pt is calling requesting calcium levels and LAD pre stent placement. Please call.

## 2015-05-20 NOTE — Telephone Encounter (Signed)
Pt requesting to get a copy of her cath report, from her previous cardiac cath on 01/12/15.  Pts rationale for this is because she will be having both of her sons checked out from a Cardiologist where they live, and even one son has had a coronary cta, and his Cardiologist requested that he obtain as much information in regards to his parents cardiac hx.  Confirmed the pts address on file to mail her cath report too.  Informed the pt that I will place this for her to pick up at the front desk, at her earliest convenience.  Pt verbalized understanding, agrees with this plan and gracious for all the assistance provided.

## 2015-05-22 ENCOUNTER — Ambulatory Visit: Payer: Self-pay | Admitting: Cardiology

## 2015-06-22 ENCOUNTER — Encounter: Payer: Self-pay | Admitting: Cardiology

## 2015-06-22 ENCOUNTER — Ambulatory Visit (INDEPENDENT_AMBULATORY_CARE_PROVIDER_SITE_OTHER): Payer: Medicare Other | Admitting: Cardiology

## 2015-06-22 VITALS — BP 110/62 | HR 58 | Ht 66.0 in | Wt 172.8 lb

## 2015-06-22 DIAGNOSIS — R600 Localized edema: Secondary | ICD-10-CM | POA: Diagnosis not present

## 2015-06-22 DIAGNOSIS — I2510 Atherosclerotic heart disease of native coronary artery without angina pectoris: Secondary | ICD-10-CM

## 2015-06-22 DIAGNOSIS — E785 Hyperlipidemia, unspecified: Secondary | ICD-10-CM | POA: Diagnosis not present

## 2015-06-22 DIAGNOSIS — I10 Essential (primary) hypertension: Secondary | ICD-10-CM

## 2015-06-22 NOTE — Patient Instructions (Signed)
Medication Instructions:  Your physician recommends that you continue on your current medications as directed. Please refer to the Current Medication list given to you today.   Labwork: Your physician recommends that you return for lab work in: about 6 months--Few days prior to appointment with Dr. Meda Coffee.  The lab opens at 7:30 AM.  This will be fasting lab work--Lipid and Liver profiles, BMP, CBC.    Testing/Procedures: none  Follow-Up: Your physician wants you to follow-up in: 6 months.  You will receive a reminder letter in the mail two months in advance. If you don't receive a letter, please call our office to schedule the follow-up appointment.   Any Other Special Instructions Will Be Listed Below (If Applicable).     If you need a refill on your cardiac medications before your next appointment, please call your pharmacy.

## 2015-06-22 NOTE — Progress Notes (Signed)
Patient ID: Ann Wagner, female   DOB: 1945/03/10, 70 y.o.   MRN: [id number]    CARDIOLOGY OFFICE NOTE  Date:  06/22/2015   Ann Wagner Date of Birth: March 31, 1945 Medical Record [id number]  PCP:  Annye Asa, MD  Cardiologist:  Meda Coffee    Chief Complaint  Patient presents with  . Follow-up   History of Present Illness: Ann Wagner is a 70 y.o. female who presents today for a post hospital visit. Seen for Dr. Meda Coffee.   She has a PMH of hypertension, hyperlipidemia, and history of vasovagal syncope. As she was turning 71, she wished to get a cardiac evaluation due to her family history. Abnormal GXT which led to cardiac CTA  which showed a calcium score 580, diffuse prox to mid LAD lesion, reading obsecured by significant calcification and associated blooming artifact. She was referred on for cardiac cath. Has not really had much in the way of symptoms.   She presented for the procedure on 01/12/2015 which showed 20% prox RCA lesion, 90% prox to mid LAD lesion treated with DES (PROMUS PREM MR 3.0X20), EF 65%. Post procedure, she was placed on Plavix. She was transitioned to 20 mg Lipitor due to her other medicines. Discharged without incident.   06/22/2015 - the patient is coming after 4 months, she states that she feels great she denies any chest pain or shortness of breath. She remains very active and is asymptomatic. She is compliant to her medications. She has no muscle pain from statins. Denies palpitations orthopnea or lower extremity edema.  Past Medical History  Diagnosis Date  . Arthritis   . History of chicken pox   . Allergic rhinitis   . GERD (gastroesophageal reflux disease)   . Hyperlipidemia   . Hyperthyroidism   . Syncope   . Hypertension   . Coronary artery disease     cath 01/12/2015 single vessel CAD, DES to mid LAD  . Headache    Past Surgical History  Procedure Laterality Date  . Tubal ligation    . Cardiac catheterization N/A 01/12/2015   Procedure: Left Heart Cath and Coronary Angiography;  Surgeon: Peter M Martinique, MD;  Location: [facility];  Service: Cardiovascular;  Laterality: N/A;  . Cardiac catheterization Right 01/12/2015    Procedure: Coronary Stent Intervention;  Surgeon: Peter M Martinique, MD;  Location: [facility];  Service: Cardiovascular;  Laterality: Right;  . Coronary stent placement  01/12/2015    des to lad   Medications: Current Outpatient Prescriptions  Medication Sig Dispense Refill  . amLODipine (NORVASC) 10 MG tablet TAKE 1 TABLET BY MOUTH EVERY DAY 90 tablet 0  . aspirin EC 81 MG tablet Take 1 tablet (81 mg total) by mouth daily.    Marland Kitchen atorvastatin (LIPITOR) 20 MG tablet Take 1 tablet (20 mg total) by mouth daily at 6 PM. 30 tablet 11  . clopidogrel (PLAVIX) 75 MG tablet Take 1 tablet (75 mg total) by mouth daily with breakfast. 90 tablet 3  . hydrochlorothiazide (HYDRODIURIL) 25 MG tablet Take 1 tablet (25 mg total) by mouth daily. 90 tablet 3  . levothyroxine (SYNTHROID, LEVOTHROID) 75 MCG tablet Take 1 tablet (75 mcg total) by mouth daily. 30 tablet 3  . lisinopril (PRINIVIL,ZESTRIL) 40 MG tablet TAKE 1 TABLET BY MOUTH EVERY DAY 90 tablet 1  . nitroGLYCERIN (NITROSTAT) 0.4 MG SL tablet PLACE 1 TABLET UNDER THE TONGUE EVERY 5 MINUTES AS NEEDED FOR CHEST PAIN 25 tablet 1  .  Omega-3 Fatty Acids (FISH OIL PO) Take 1 capsule by mouth daily.    . pantoprazole (PROTONIX) 40 MG tablet Take 1 tablet (40 mg total) by mouth daily at 12 noon. 30 tablet 11  . PARoxetine (PAXIL) 10 MG tablet TAKE 1 TABLET BY MOUTH DAILY 90 tablet 0   No current facility-administered medications for this visit.   Allergies: Allergies  Allergen Reactions  . Propoxyphene N-Acetaminophen Other (See Comments)    Made sick   Social History: The patient  reports that she has quit smoking. She has never used smokeless tobacco. She reports that she drinks alcohol. She reports that she does not use illicit drugs.   Family  History: The patient's family history includes Coronary artery disease in her father, mother, and paternal grandmother; Diabetes in her mother; Heart attack in her mother; Hypertension in her father, mother, and paternal grandmother. There is no history of Stroke or Cancer.   Review of Systems: Please see the history of present illness.   Otherwise, the review of systems is positive for none.   All other systems are reviewed and negative.   Physical Exam: VS:  BP 110/62 mmHg  Pulse 58  Ht 5\' 6"  (1.676 m)  Wt 172 lb 12.8 oz (78.382 kg)  BMI 27.90 kg/m2  SpO2 96% .  BMI Body mass index is 27.9 kg/(m^2).  Wt Readings from Last 3 Encounters:  06/22/15 172 lb 12.8 oz (78.382 kg)  05/13/15 172 lb 2 oz (78.075 kg)  02/20/15 173 lb 12.8 oz (78.835 kg)   General: Pleasant. She looks younger than her stated age. She is alert and in no acute distress.  HEENT: Normal. Neck: Supple, no JVD, carotid bruits, or masses noted.  Cardiac: Regular rate and rhythm. No murmurs, rubs, or gallops. No edema.  Respiratory:  Lungs are clear to auscultation bilaterally with normal work of breathing.  GI: Soft and nontender.  MS: No deformity or atrophy. Gait and ROM intact. Skin: Warm and dry. Color is normal.  Neuro:  Strength and sensation are intact and no gross focal deficits noted.  Psych: Alert, appropriate and with normal affect.  LABORATORY DATA:  EKG:  EKG is not ordered today.  Lab Results  Component Value Date   WBC 7.9 02/20/2015   HGB 14.9 02/20/2015   HCT 42.2 02/20/2015   PLT 291 02/20/2015   GLUCOSE 101* 02/20/2015   CHOL 162 02/20/2015   TRIG 169* 02/20/2015   HDL 40* 02/20/2015   LDLDIRECT 92.9 12/20/2011   LDLCALC 88 02/20/2015   ALT 30* 02/20/2015   AST 25 02/20/2015   NA 139 02/20/2015   K 4.3 02/20/2015   CL 102 02/20/2015   CREATININE 0.91 02/20/2015   BUN 19 02/20/2015   CO2 27 02/20/2015   TSH 1.48 04/30/2014   INR 0.94 01/07/2015   HGBA1C 6.4 08/22/2013   Other  Studies Reviewed Today:  Cardiac catheterization 01/12/2015 Conclusion     Prox RCA lesion, 20% stenosed.  The left ventricular systolic function is normal.  Prox LAD to Mid LAD lesion, 90% stenosed. Post intervention, there is a 0% residual stenosis.  1. Single vessel obstructive CAD 2. Normal LV function 3. Successful stenting of the mid LAD with DES.  Plan: DAPT for one year. Anticipate DC in am.           Assessment/Plan:  1. CAD with abnormal GXT/cardiac CT - s/p cath with PCI for single vessel CAD - on DAPT for one year. Doing very well  clinically. She has no bleeding complication is tolerating all the medications were well. She continues to exercise at high-level she is encouraged to continue doing so.  2. HLD - on atorvastatin 20 mg daily, most recent profile in December 2016 HDL 40, LDL 88 and triglycerides 169 for now just lifestyle modifications. We will recheck lipids prior to next appointment 6 months.  3. HTN - well controlled.   4. GERD - on Protonix without issue.   5. Intermittent lower extremity edema - that resolves from the evening to the morning, she is informed that amlodipine can worsen her lower extremity swelling, however currently completely normal we will continue current dose of amlodipine unless it gets worse.  Follow-up in 6 months with CMP, CBC and lipids prior to that appointment.  Signed: Dorothy Spark, MD  06/22/2015 9:18 AM  [facility] [address] [place], [place]  [address] Phone: [phone] Fax: [phone]

## 2015-07-14 ENCOUNTER — Other Ambulatory Visit: Payer: Self-pay | Admitting: Family Medicine

## 2015-07-15 NOTE — Telephone Encounter (Signed)
Medication filled to pharmacy as requested.   

## 2015-07-23 ENCOUNTER — Other Ambulatory Visit: Payer: Self-pay

## 2015-07-23 DIAGNOSIS — Z1231 Encounter for screening mammogram for malignant neoplasm of breast: Secondary | ICD-10-CM

## 2015-07-28 ENCOUNTER — Other Ambulatory Visit: Payer: Self-pay | Admitting: Family Medicine

## 2015-07-28 NOTE — Telephone Encounter (Signed)
Last OV 05/13/15 (sinus) TSH has not been tested since 04/30/2014  No upcoming appts, letter mailed 01/05/15 to make a BP and cholesterol follow up and also written on 03/18/15 rx refill when pt was given #30 with 3

## 2015-07-28 NOTE — Telephone Encounter (Signed)
Medication filled to pharmacy as requested.   

## 2015-07-28 NOTE — Telephone Encounter (Signed)
[facility] for #30, no refills w/o appt and lab work

## 2015-07-29 NOTE — Telephone Encounter (Signed)
Medication filled to pharmacy as requested.   

## 2015-08-04 ENCOUNTER — Ambulatory Visit
Admission: RE | Admit: 2015-08-04 | Discharge: 2015-08-04 | Disposition: A | Discharge status: Home / Self Care | Payer: Medicare Other | Source: Ambulatory Visit

## 2015-08-04 DIAGNOSIS — Z1231 Encounter for screening mammogram for malignant neoplasm of breast: Secondary | ICD-10-CM

## 2015-08-18 ENCOUNTER — Other Ambulatory Visit: Payer: Self-pay | Admitting: Family Medicine

## 2015-08-18 NOTE — Telephone Encounter (Signed)
Medication filled to pharmacy as requested.   

## 2015-10-12 ENCOUNTER — Other Ambulatory Visit: Payer: Self-pay | Admitting: Family Medicine

## 2015-10-13 ENCOUNTER — Telehealth: Payer: Self-pay | Admitting: Family Medicine

## 2015-10-13 NOTE — Telephone Encounter (Signed)
Pt states that she needs refill on levothyroxine, pt states she is out of town and needs this sent to [facility] on [address] in [place], [place] ph # [phone]

## 2015-10-14 MED ORDER — LEVOTHYROXINE SODIUM 75 MCG PO TABS: ORAL_TABLET | ORAL | 0 refills | Status: DC

## 2015-10-14 NOTE — Telephone Encounter (Signed)
Medication filled to pharmacy as requested.   

## 2015-11-12 ENCOUNTER — Other Ambulatory Visit: Payer: Self-pay

## 2015-11-12 MED ORDER — PAROXETINE HCL 10 MG PO TABS: 10.0000 mg | ORAL_TABLET | Freq: Every day | ORAL | 0 refills | Status: DC

## 2015-11-12 NOTE — Telephone Encounter (Signed)
Medication filled to pharmacy as requested.   

## 2015-11-12 NOTE — Telephone Encounter (Signed)
Patient requesting a refill for PARoxetine (PAXIL) 10 MG tablet to be sent to pharmacy on file.

## 2015-11-12 NOTE — Telephone Encounter (Signed)
Last OV 05/13/15 paxil last filled 08/18/15 #90 with 0

## 2015-11-28 ENCOUNTER — Other Ambulatory Visit: Payer: Self-pay | Admitting: Family Medicine

## 2015-12-15 NOTE — Progress Notes (Signed)
Pre visit review using our clinic review tool, if applicable. No additional management support is needed unless otherwise documented below in the visit note. 

## 2015-12-15 NOTE — Progress Notes (Signed)
Subjective:   Ann Wagner is a 70 y.o. female who presents for Medicare Annual (Subsequent) preventive examination.  The Patient was informed that the wellness visit is to identify future health risk and educate and initiate measures that can reduce risk for increased disease through the lifespan.   Describes health as fair, good or great? "good"  Review of Systems:  No ROS.  Medicare Wellness Visit.  Cardiac Risk Factors include: advanced age (>27men, >72 women);dyslipidemia;family history of premature cardiovascular disease;hypertension   Sleep patterns: Sleeps at least 8 hours, up 2-3 times to void.  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 Two story home, no difficulty with steps. No firearms.  Seat Belt Safety/Bike Helmet: Wears seatbelt.    Counseling:   Eye Exam-Last exam 2 years, wears reading glasses. Followed by optometrist, unsure of name of provider.  Dental-Last exam 2 years ago, will make an appointment. Plans to find a new dentist.   Female:   Pap-05/2011; N/A      Mammo-08/04/15; normal. Recall 1 year.       Dexa scan-12/14/12; normal. Recall 3-5 years.      CCS-Colonoscopy 08/25/2009; Moderate diverticulitis. Recall 08/2019.       Objective:     Vitals: BP 138/60 (BP Location: Left Arm, Patient Position: Sitting, Cuff Size: Normal)   Pulse (!) 59   Ht 5\' 5"  (1.651 m)   Wt 179 lb 12.8 oz (81.6 kg)   SpO2 97%   BMI 29.92 kg/m   Body mass index is 29.92 kg/m.   Tobacco History  Smoking Status  . Former Smoker  Smokeless Tobacco  . Never Used     Counseling given: Not Answered   Past Medical History:  Diagnosis Date  . Allergic rhinitis   . Arthritis   . Coronary artery disease    cath 01/12/2015 single vessel CAD, DES to mid LAD  . GERD (gastroesophageal reflux disease)   . Headache   . History of chicken pox   . Hyperlipidemia   . Hypertension   .  Hyperthyroidism   . Syncope    Past Surgical History:  Procedure Laterality Date  . CARDIAC CATHETERIZATION N/A 01/12/2015   Procedure: Left Heart Cath and Coronary Angiography;  Surgeon: Peter M Martinique, MD;  Location: [facility];  Service: Cardiovascular;  Laterality: N/A;  . CARDIAC CATHETERIZATION Right 01/12/2015   Procedure: Coronary Stent Intervention;  Surgeon: Peter M Martinique, MD;  Location: [facility];  Service: Cardiovascular;  Laterality: Right;  . CORONARY STENT PLACEMENT  01/12/2015   des to [place]  . TUBAL LIGATION     Family History  Problem Relation Age of Onset  . Coronary artery disease Mother   . Hypertension Mother   . Heart attack Mother   . Diabetes Mother   . Coronary artery disease Father   . Hypertension Father   . Coronary artery disease Paternal Grandmother   . Hypertension Paternal Grandmother   . Stroke Neg Hx   . Cancer Neg Hx    History  Sexual Activity  . Sexual activity: Not on file    Outpatient Encounter Prescriptions as of 12/16/2015  Medication Sig  . amLODipine (NORVASC) 10 MG tablet TAKE 1 TABLET BY MOUTH EVERY DAY  . aspirin EC 81 MG tablet Take 1 tablet (81 mg total) by mouth daily.  Marland Kitchen atorvastatin (LIPITOR) 20 MG tablet Take 1 tablet (20 mg total) by  mouth daily at 6 PM.  . clopidogrel (PLAVIX) 75 MG tablet Take 1 tablet (75 mg total) by mouth daily with breakfast.  . hydrochlorothiazide (HYDRODIURIL) 25 MG tablet Take 1 tablet (25 mg total) by mouth daily.  Marland Kitchen levothyroxine (SYNTHROID, LEVOTHROID) 75 MCG tablet TAKE 1 TABLET BY MOUTH EVERY DAY. HAVE TO MAKE APPOINTMENT FOR MORE REFILLS  . lisinopril (PRINIVIL,ZESTRIL) 40 MG tablet TAKE 1 TABLET BY MOUTH EVERY DAY  . nitroGLYCERIN (NITROSTAT) 0.4 MG SL tablet PLACE 1 TABLET UNDER THE TONGUE EVERY 5 MINUTES AS NEEDED FOR CHEST PAIN  . Omega-3 Fatty Acids (FISH OIL PO) Take 1 capsule by mouth daily.  . pantoprazole (PROTONIX) 40 MG tablet Take 1 tablet (40 mg total) by mouth  daily at 12 noon.  Marland Kitchen PARoxetine (PAXIL) 10 MG tablet Take 1 tablet (10 mg total) by mouth daily.   No facility-administered encounter medications on file as of 12/16/2015.     Activities of Daily Living In your present state of health, do you have any difficulty performing the following activities: 12/16/2015 01/12/2015  Hearing? N -  Vision? N -  Difficulty concentrating or making decisions? N -  Walking or climbing stairs? N -  Dressing or bathing? N -  Doing errands, shopping? N N  Preparing Food and eating ? N -  Using the Toilet? N -  In the past six months, have you accidently leaked urine? Y -  Do you have problems with loss of bowel control? N -  Managing your Medications? N -  Managing your Finances? N -  Housekeeping or managing your Housekeeping? N -  Some recent data might be hidden    Patient Care Team: Midge Minium, MD as PCP - General Dorothy Spark, MD as Consulting Physician (Cardiology)    Assessment:    Physical assessment deferred to PCP.  Exercise Activities and Dietary recommendations Current Exercise Habits: Home exercise routine, Type of exercise: walking, Time (Minutes): 45, Frequency (Times/Week): 3, Weekly Exercise (Minutes/Week): 135, Intensity: Moderate, Exercise limited by: cardiac condition(s)   Diet (meal preparation, eat out, water intake, caffeinated beverages, dairy products, fruits and vegetables):Eats about 2 meals a day. Drinks hot tea and flavored water. Prepares own meals.  Breakfast: Normally skips. Encouraged to eat breakfast, patient normally eats an early lunch.  Lunch: salad, sandwich that includes lettuce and tomato. Dinner: chicken or fish, vegetables and salad. Limited carb intake, typically only  natural from vegetables.   Goals    . Weight (lb) < 165 lb (74.8 kg)          Patient would like to lose weight. Plans to exercise more, walking more frequently. May participate in silver sneakers at [facility].       Fall  Risk Fall Risk  12/16/2015 05/13/2015 04/30/2014 12/04/2012  Falls in the past year? No No No No   Depression Screen PHQ 2/9 Scores 12/16/2015 05/13/2015 04/30/2014 12/04/2012  PHQ - 2 Score 0 0 0 0       Cognitive Testing No flowsheet data found.  Ad8 score reviewed for issues:  Issues making decisions:No  Less interest in hobbies / activities:No  Repeats questions, stories (family complaining):No  Trouble using ordinary gadgets (microwave, computer, phone):No  Forgets the month or year: No  Mismanaging finances: No  Remembering appts: No  Daily problems with thinking and/or memory: No Ad8 score is=0    Immunization History  Administered Date(s) Administered  . Influenza,inj,Quad PF,36+ Mos 11/29/2014  . Influenza-Unspecified 12/29/2012, 10/29/2013  .  Pneumococcal Conjugate-13 04/30/2014  . Zoster 02/28/2010      Screening Tests Health Maintenance  Topic Date Due  . PNA vac Low Risk Adult (2 of 2 - PPSV23) 04/30/2015  . INFLUENZA VACCINE  05/28/2016 (Originally 09/29/2015)  . TETANUS/TDAP  12/15/2016 (Originally 11/30/1964)  . Hepatitis C Screening  12/15/2016 (Originally 03-04-1945)  . MAMMOGRAM  08/03/2017  . COLONOSCOPY  08/29/2019  . DEXA SCAN  Completed  . ZOSTAVAX  Completed   Plans to go to [facility] for Flu Vaccine soon. Patient states she had Tetanus Vaccine within the last 10 years while living in [place].  Will consider Hepatitis C screening.      Plan:     Continue to eat heart healthy diet (full of fruits, vegetables, whole grains, lean protein, water--limit salt, fat, and sugar intake) and increase physical activity as tolerated.  Continue doing brain stimulating activities (puzzles, reading, adult coloring books, staying active) to keep memory sharp.   Make appointment with cardiologist, optometrist, and dentist.   Bring a copy of your advanced directives to your next office visit.  Please get FLU shot soon!  During the course of the visit  the patient was educated and counseled about the following appropriate screening and preventive services:   Vaccines to include Pneumoccal, Influenza, Hepatitis B, Td, Zostavax, HCV  Cardiovascular Disease  Colorectal cancer screening  Bone density screening  Diabetes screening  Glaucoma screening  Mammography/PAP  Nutrition counseling   Patient Instructions (the written plan) was given to the patient.   Gerilyn Nestle, RN  12/16/2015

## 2015-12-16 ENCOUNTER — Ambulatory Visit (INDEPENDENT_AMBULATORY_CARE_PROVIDER_SITE_OTHER): Payer: Medicare Other

## 2015-12-16 VITALS — BP 138/60 | HR 59 | Ht 65.0 in | Wt 179.8 lb

## 2015-12-16 DIAGNOSIS — Z0000 Encounter for general adult medical examination without abnormal findings: Secondary | ICD-10-CM

## 2015-12-16 NOTE — Patient Instructions (Addendum)
Continue to eat heart healthy diet (full of fruits, vegetables, whole grains, lean protein, water--limit salt, fat, and sugar intake) and increase physical activity as tolerated.  Continue doing brain stimulating activities (puzzles, reading, adult coloring books, staying active) to keep memory sharp.   Make appointment with cardiologist, optometrist, and dentist.   Bring a copy of your advanced directives to your next office visit.   Please get FLU shot soon!   Fall Prevention in the Home  Falls can cause injuries. They can happen to people of all ages. There are many things you can do to make your home safe and to help prevent falls.  WHAT CAN I DO ON THE OUTSIDE OF MY HOME?  Regularly fix the edges of walkways and driveways and fix any cracks.  Remove anything that might make you trip as you walk through a door, such as a raised step or threshold.  Trim any bushes or trees on the path to your home.  Use bright outdoor lighting.  Clear any walking paths of anything that might make someone trip, such as rocks or tools.  Regularly check to see if handrails are loose or broken. Make sure that both sides of any steps have handrails.  Any raised decks and porches should have guardrails on the edges.  Have any leaves, snow, or ice cleared regularly.  Use sand or salt on walking paths during winter.  Clean up any spills in your garage right away. This includes oil or grease spills. WHAT CAN I DO IN THE BATHROOM?   Use night lights.  Install grab bars by the toilet and in the tub and shower. Do not use towel bars as grab bars.  Use non-skid mats or decals in the tub or shower.  If you need to sit down in the shower, use a plastic, non-slip stool.  Keep the floor dry. Clean up any water that spills on the floor as soon as it happens.  Remove soap buildup in the tub or shower regularly.  Attach bath mats securely with double-sided non-slip rug tape.  Do not have throw rugs  and other things on the floor that can make you trip. WHAT CAN I DO IN THE BEDROOM?  Use night lights.  Make sure that you have a light by your bed that is easy to reach.  Do not use any sheets or blankets that are too big for your bed. They should not hang down onto the floor.  Have a firm chair that has side arms. You can use this for support while you get dressed.  Do not have throw rugs and other things on the floor that can make you trip. WHAT CAN I DO IN THE KITCHEN?  Clean up any spills right away.  Avoid walking on wet floors.  Keep items that you use a lot in easy-to-reach places.  If you need to reach something above you, use a strong step stool that has a grab bar.  Keep electrical cords out of the way.  Do not use floor polish or wax that makes floors slippery. If you must use wax, use non-skid floor wax.  Do not have throw rugs and other things on the floor that can make you trip. WHAT CAN I DO WITH MY STAIRS?  Do not leave any items on the stairs.  Make sure that there are handrails on both sides of the stairs and use them. Fix handrails that are broken or loose. Make sure that handrails are as  long as the stairways.  Check any carpeting to make sure that it is firmly attached to the stairs. Fix any carpet that is loose or worn.  Avoid having throw rugs at the top or bottom of the stairs. If you do have throw rugs, attach them to the floor with carpet tape.  Make sure that you have a light switch at the top of the stairs and the bottom of the stairs. If you do not have them, ask someone to add them for you. WHAT ELSE CAN I DO TO HELP PREVENT FALLS?  Wear shoes that:  Do not have high heels.  Have rubber bottoms.  Are comfortable and fit you well.  Are closed at the toe. Do not wear sandals.  If you use a stepladder:  Make sure that it is fully opened. Do not climb a closed stepladder.  Make sure that both sides of the stepladder are locked into  place.  Ask someone to hold it for you, if possible.  Clearly mark and make sure that you can see:  Any grab bars or handrails.  First and last steps.  Where the edge of each step is.  Use tools that help you move around (mobility aids) if they are needed. These include:  Canes.  Walkers.  Scooters.  Crutches.  Turn on the lights when you go into a dark area. Replace any light bulbs as soon as they burn out.  Set up your furniture so you have a clear path. Avoid moving your furniture around.  If any of your floors are uneven, fix them.  If there are any pets around you, be aware of where they are.  Review your medicines with your doctor. Some medicines can make you feel dizzy. This can increase your chance of falling. Ask your doctor what other things that you can do to help prevent falls.   This information is not intended to replace advice given to you by your health care provider. Make sure you discuss any questions you have with your health care provider.   Document Released: 12/11/2008 Document Revised: 07/01/2014 Document Reviewed: 03/21/2014 Elsevier Interactive Patient Education [employer].

## 2015-12-16 NOTE — Progress Notes (Signed)
Reviewed documentation and agree w/ note as written by RN.  Pt is due for CPE and should schedule at her convenience

## 2015-12-28 ENCOUNTER — Other Ambulatory Visit: Payer: Self-pay | Admitting: Family Medicine

## 2015-12-28 ENCOUNTER — Other Ambulatory Visit: Payer: Self-pay | Admitting: Cardiology

## 2015-12-28 ENCOUNTER — Other Ambulatory Visit: Payer: Self-pay | Admitting: Nurse Practitioner

## 2015-12-28 DIAGNOSIS — R42 Dizziness and giddiness: Secondary | ICD-10-CM

## 2015-12-28 DIAGNOSIS — I6523 Occlusion and stenosis of bilateral carotid arteries: Secondary | ICD-10-CM

## 2015-12-29 ENCOUNTER — Ambulatory Visit (HOSPITAL_COMMUNITY)
Admission: RE | Admit: 2015-12-29 | Discharge: 2015-12-29 | Disposition: A | Discharge status: Home / Self Care | Payer: Medicare Other | Source: Ambulatory Visit | Attending: Cardiovascular Disease | Admitting: Cardiovascular Disease

## 2015-12-29 DIAGNOSIS — I6523 Occlusion and stenosis of bilateral carotid arteries: Secondary | ICD-10-CM | POA: Insufficient documentation

## 2015-12-29 DIAGNOSIS — R42 Dizziness and giddiness: Secondary | ICD-10-CM | POA: Insufficient documentation

## 2015-12-31 ENCOUNTER — Telehealth: Payer: Self-pay | Admitting: *Deleted

## 2015-12-31 DIAGNOSIS — I6523 Occlusion and stenosis of bilateral carotid arteries: Secondary | ICD-10-CM

## 2015-12-31 DIAGNOSIS — I2510 Atherosclerotic heart disease of native coronary artery without angina pectoris: Secondary | ICD-10-CM

## 2015-12-31 DIAGNOSIS — I739 Peripheral vascular disease, unspecified: Principal | ICD-10-CM

## 2015-12-31 DIAGNOSIS — I779 Disorder of arteries and arterioles, unspecified: Secondary | ICD-10-CM

## 2015-12-31 NOTE — Telephone Encounter (Signed)
VAS US CAROTID  Order: [id number]  Status:  Final result Visible to patient:  Yes (MyChart) Dx:  DIZZINESS; Carotid artery plaque, bil...  Notes Recorded by Dorothy Spark, MD on 12/30/2015 at 5:31 PM EDT Stable 40-59% right ICA stenosis.  Stable 1-39% left ICA stenosis.  Normal subclavian arteries, bilaterally.  F/u 1 year.     Notified the pt of carotid duplex results per Dr Meda Coffee, as mentioned above.  Informed the pt that we will follow-up with this, with a repeat study in one year.  Informed the pt that I will place the order in the system, and have our [facility] scheduling call her back to have this scheduled for one year out.  Pt verbalized understanding and agrees with this plan.

## 2016-01-13 ENCOUNTER — Other Ambulatory Visit: Payer: Self-pay | Admitting: Family Medicine

## 2016-01-13 ENCOUNTER — Other Ambulatory Visit: Payer: Self-pay | Admitting: Physician Assistant

## 2016-01-13 NOTE — Telephone Encounter (Signed)
Please review for refill. Thanks!  

## 2016-01-15 ENCOUNTER — Telehealth: Payer: Self-pay | Admitting: Family Medicine

## 2016-01-15 ENCOUNTER — Other Ambulatory Visit: Payer: Self-pay | Admitting: Family Medicine

## 2016-01-15 MED ORDER — PANTOPRAZOLE SODIUM 40 MG PO TBEC: 40.0000 mg | DELAYED_RELEASE_TABLET | Freq: Every day | ORAL | 6 refills | Status: DC

## 2016-01-15 NOTE — Telephone Encounter (Signed)
Medication filled to pharmacy as requested.   

## 2016-01-15 NOTE — Telephone Encounter (Signed)
Caller LM on VM stating that pt needed refill on pantoprazole.

## 2016-01-20 ENCOUNTER — Other Ambulatory Visit: Payer: Self-pay | Admitting: Family Medicine

## 2016-01-26 ENCOUNTER — Other Ambulatory Visit: Payer: Self-pay | Admitting: Family Medicine

## 2016-02-17 ENCOUNTER — Ambulatory Visit: Payer: Self-pay | Admitting: Family Medicine

## 2016-02-20 ENCOUNTER — Other Ambulatory Visit: Payer: Self-pay | Admitting: Family Medicine

## 2016-02-27 ENCOUNTER — Encounter: Payer: Self-pay | Admitting: Family Medicine

## 2016-02-27 ENCOUNTER — Other Ambulatory Visit: Payer: Self-pay | Admitting: Family Medicine

## 2016-02-27 ENCOUNTER — Ambulatory Visit: Payer: Medicare Other | Admitting: Internal Medicine

## 2016-02-27 ENCOUNTER — Ambulatory Visit (INDEPENDENT_AMBULATORY_CARE_PROVIDER_SITE_OTHER): Payer: Medicare Other | Admitting: Family Medicine

## 2016-02-27 VITALS — BP 174/66 | HR 59 | Temp 97.7°F | Resp 16 | Wt 178.0 lb

## 2016-02-27 DIAGNOSIS — K047 Periapical abscess without sinus: Secondary | ICD-10-CM

## 2016-02-27 MED ORDER — TRAMADOL HCL 50 MG PO TABS: ORAL_TABLET | ORAL | 0 refills | Status: DC

## 2016-02-27 MED ORDER — AMOXICILLIN 875 MG PO TABS: 875.0000 mg | ORAL_TABLET | Freq: Two times a day (BID) | ORAL | 0 refills | Status: DC

## 2016-02-27 NOTE — Progress Notes (Signed)
Pre visit review using our clinic review tool, if applicable. No additional management support is needed unless otherwise documented below in the visit note. 

## 2016-02-27 NOTE — Progress Notes (Signed)
   Subjective:  :   Patient Ann Wagner, female    DOB: May 26, 1945, 70 y.o.   MRN: [id number]  HPI Here for one week of pain around a tooth on the left upper jaw, which is getting worse. Now the gum around the tooth is swelling. No fever. Using Tylenol with little relief. She called her dentist's office a few days ago but they have been closed all this week.    Review of Systems  Constitutional: Negative.   HENT: Positive for dental problem. Negative for postnasal drip, sinus pain and sore throat.   Eyes: Negative.   Respiratory: Negative.        Objective:   Physical Exam  Constitutional: She appears well-developed and well-nourished.  HENT:  Right Ear: External ear normal.  Left Ear: External ear normal.  Nose: Nose normal.  The gum around the next to last molar on the left upper jaw is swollen and tender  Eyes: Conjunctivae are normal.  Neck: No thyromegaly present.  Pulmonary/Chest: Effort normal and breath sounds normal.  Lymphadenopathy:    She has no cervical adenopathy.          Assessment & Plan:  Early tooth abscess. Treat with Amoxicillin. Use Tramadol for pain. She will see her dentist next week.  Alysia Penna, MD

## 2016-03-02 ENCOUNTER — Other Ambulatory Visit: Payer: Self-pay | Admitting: Family Medicine

## 2016-03-04 ENCOUNTER — Ambulatory Visit: Payer: Self-pay | Admitting: Cardiology

## 2016-03-07 ENCOUNTER — Ambulatory Visit: Payer: Self-pay | Admitting: Cardiology

## 2016-03-09 ENCOUNTER — Ambulatory Visit (INDEPENDENT_AMBULATORY_CARE_PROVIDER_SITE_OTHER): Payer: Medicare Other | Admitting: Cardiology

## 2016-03-09 VITALS — BP 128/72 | HR 49 | Ht 65.0 in | Wt 175.0 lb

## 2016-03-09 DIAGNOSIS — Z955 Presence of coronary angioplasty implant and graft: Secondary | ICD-10-CM

## 2016-03-09 DIAGNOSIS — M791 Myalgia, unspecified site: Secondary | ICD-10-CM

## 2016-03-09 DIAGNOSIS — Z789 Other specified health status: Secondary | ICD-10-CM

## 2016-03-09 DIAGNOSIS — R9431 Abnormal electrocardiogram [ECG] [EKG]: Secondary | ICD-10-CM | POA: Diagnosis not present

## 2016-03-09 DIAGNOSIS — I10 Essential (primary) hypertension: Secondary | ICD-10-CM

## 2016-03-09 DIAGNOSIS — I2510 Atherosclerotic heart disease of native coronary artery without angina pectoris: Secondary | ICD-10-CM

## 2016-03-09 DIAGNOSIS — I259 Chronic ischemic heart disease, unspecified: Secondary | ICD-10-CM

## 2016-03-09 MED ORDER — ROSUVASTATIN CALCIUM 5 MG PO TABS: 5.0000 mg | ORAL_TABLET | Freq: Every day | ORAL | 3 refills | Status: DC

## 2016-03-09 NOTE — Progress Notes (Signed)
Patient ID: Ann Wagner, female   DOB: 1945/12/22, 71 y.o.   MRN: [id number]    CARDIOLOGY OFFICE NOTE  Date:  03/09/2016   Ann Wagner Date of Birth: 1945/10/21 Medical Record [id number]   PCP:  Annye Asa, MD  Cardiologist:  Nancy Fetter chief complaint on file.  History of Present Illness: Ann Wagner is a 71 y.o. female who presents today for a post hospital visit. Seen for Dr. Meda Coffee.   She has a PMH of hypertension, hyperlipidemia, and history of vasovagal syncope. As she was turning 10, she wished to get a cardiac evaluation due to her family history. Abnormal GXT which led to cardiac CTA  which showed a calcium score 580, diffuse prox to mid LAD lesion, reading obsecured by significant calcification and associated blooming artifact. She was referred on for cardiac cath. Has not really had much in the way of symptoms.   She presented for the procedure on 01/12/2015 which showed 20% prox RCA lesion, 90% prox to mid LAD lesion treated with DES (PROMUS PREM MR 3.0X20), EF 65%. Post procedure, she was placed on Plavix. She was transitioned to 20 mg Lipitor due to her other medicines. Discharged without incident.   06/22/2015 - the patient is coming after 4 months, she states that she feels great she denies any chest pain or shortness of breath. She remains very active and is asymptomatic. She is compliant to her medications. She has no muscle pain from statins. Denies palpitations orthopnea or lower extremity edema.  03/09/2012 - the patient is coming after 6 months, she overall feels well denies any exertional chest shortness of breath her lower extremity edema has resolved. She has no orthopnea or proximal nocturnal dyspnea no palpitations or syncope. She denies any dizziness despite being bradycardic. Her atorvastatin is causing her progressively worsening muscle pain. And she also complains of easy bruising of dual antiplatelet therapy.  Past Medical History:  Diagnosis  Date  . Allergic rhinitis   . Arthritis   . Coronary artery disease    cath 01/12/2015 single vessel CAD, DES to mid LAD  . GERD (gastroesophageal reflux disease)   . Headache   . History of chicken pox   . Hyperlipidemia   . Hypertension   . Hyperthyroidism   . Syncope    Past Surgical History:  Procedure Laterality Date  . CARDIAC CATHETERIZATION N/A 01/12/2015   Procedure: Left Heart Cath and Coronary Angiography;  Surgeon: Peter M Martinique, MD;  Location: [facility];  Service: Cardiovascular;  Laterality: N/A;  . CARDIAC CATHETERIZATION Right 01/12/2015   Procedure: Coronary Stent Intervention;  Surgeon: Peter M Martinique, MD;  Location: [facility];  Service: Cardiovascular;  Laterality: Right;  . CORONARY STENT PLACEMENT  01/12/2015   des to [place]  . TUBAL LIGATION     Medications: Current Outpatient Prescriptions  Medication Sig Dispense Refill  . amLODipine (NORVASC) 10 MG tablet TAKE 1 TABLET BY MOUTH EVERY DAY 30 tablet 0  . amoxicillin (AMOXIL) 875 MG tablet Take 1 tablet (875 mg total) by mouth 2 (two) times daily. 20 tablet 0  . aspirin EC 81 MG tablet Take 1 tablet (81 mg total) by mouth daily.    Marland Kitchen atorvastatin (LIPITOR) 20 MG tablet TAKE 1 TABLET(20 MG) BY MOUTH DAILY AT 6 PM 30 tablet 1  . clopidogrel (PLAVIX) 75 MG tablet TAKE 1 TABLET(75 MG) BY MOUTH DAILY WITH BREAKFAST 90 tablet 1  . hydrochlorothiazide (HYDRODIURIL) 25  MG tablet TAKE 1 TABLET(25 MG) BY MOUTH DAILY 90 tablet 0  . levothyroxine (SYNTHROID, LEVOTHROID) 75 MCG tablet TAKE 1 TABLET BY MOUTH EVERY DAY 30 tablet 6  . lisinopril (PRINIVIL,ZESTRIL) 40 MG tablet TAKE 1 TABLET BY MOUTH EVERY DAY 30 tablet 6  . nitroGLYCERIN (NITROSTAT) 0.4 MG SL tablet PLACE 1 TABLET UNDER THE TONGUE EVERY 5 MINUTES AS NEEDED FOR CHEST PAIN 25 tablet 1  . Omega-3 Fatty Acids (FISH OIL PO) Take 1 capsule by mouth daily.    . pantoprazole (PROTONIX) 40 MG tablet TAKE 1 TABLET BY MOUTH DAILY AT NOON 90 tablet 0  .  PARoxetine (PAXIL) 10 MG tablet TAKE 1 TABLET BY MOUTH DAILY 90 tablet 0   No current facility-administered medications for this visit.    Allergies: Allergies  Allergen Reactions  . Propoxyphene N-Acetaminophen Other (See Comments)    Made sick   Social History: The patient  reports that she has quit smoking. She has never used smokeless tobacco. She reports that she drinks alcohol. She reports that she does not use drugs.   Family History: The patient's family history includes Coronary artery disease in her father, mother, and paternal grandmother; Diabetes in her mother; Heart attack in her mother; Hypertension in her father, mother, and paternal grandmother.   Review of Systems: Please see the history of present illness.   Otherwise, the review of systems is positive for none.   All other systems are reviewed and negative.   Physical Exam: VS:  BP 128/72   Pulse (!) 49   Ht 5\' 5"  (1.651 m)   Wt 175 lb (79.4 kg)   BMI 29.12 kg/m  .  BMI Body mass index is 29.12 kg/m.  Wt Readings from Last 3 Encounters:  03/09/16 175 lb (79.4 kg)  02/27/16 178 lb (80.7 kg)  12/16/15 179 lb 12.8 oz (81.6 kg)   General: Pleasant. She looks younger than her stated age. She is alert and in no acute distress.   HEENT: Normal.  Neck: Supple, no JVD, carotid bruits, or masses noted.  Cardiac: Regular rate and rhythm. No murmurs, rubs, or gallops. No edema.  Respiratory:  Lungs are clear to auscultation bilaterally with normal work of breathing.  GI: Soft and nontender.  MS: No deformity or atrophy. Gait and ROM intact.  Skin: Warm and dry. Color is normal.  Neuro:  Strength and sensation are intact and no gross focal deficits noted.  Psych: Alert, appropriate and with normal affect.  LABORATORY DATA:  EKG:  EKG is not ordered today.  Lab Results  Component Value Date   WBC 7.9 02/20/2015   HGB 14.9 02/20/2015   HCT 42.2 02/20/2015   PLT 291 02/20/2015   GLUCOSE 101 (H) 02/20/2015    CHOL 162 02/20/2015   TRIG 169 (H) 02/20/2015   HDL 40 (L) 02/20/2015   LDLDIRECT 92.9 12/20/2011   LDLCALC 88 02/20/2015   ALT 30 (H) 02/20/2015   AST 25 02/20/2015   NA 139 02/20/2015   K 4.3 02/20/2015   CL 102 02/20/2015   CREATININE 0.91 02/20/2015   BUN 19 02/20/2015   CO2 27 02/20/2015   TSH 1.48 04/30/2014   INR 0.94 01/07/2015   HGBA1C 6.4 08/22/2013   Other Studies Reviewed Today:  Cardiac catheterization 01/12/2015 Conclusion     Prox RCA lesion, 20% stenosed.  The left ventricular systolic function is normal.  Prox LAD to Mid LAD lesion, 90% stenosed. Post intervention, there is a 0% residual stenosis.  1. Single vessel obstructive CAD 2. Normal LV function 3. Successful stenting of the mid LAD with DES.  Plan: DAPT for one year. Anticipate DC in am.         ECG today; SB, cant rule out anterior MI, age undetermined.   Assessment/Plan:  1. CAD with abnormal GXT/cardiac CT - s/p cath with PCI for single vessel CAD - on DAPT for one year - completed in 12/2015, she has easy bruising with Plavix, we will discontinue and continue ASA 81 mg po daily only. No BB as she is bradycardic but asymptomatic. She continues to exercise at high-level she is encouraged to continue doing so. Order an echocardiogram as her ECG suggestive of anterior MI and she had normal LVEF with no regional WMA on ventriculogram, no echocardiogram in the past.   2. HLD - on atorvastatin 20 mg daily, with worsening muscle pain, we will switch to rosuvastatin 5 mg po daily, continue omega 3 acids, we will recheck lipids today.  3. HTN - well controlled.   4. GERD - on Protonix without issue.   5. Intermittent lower extremity edema - resolved  Labwork:  TODAY--CMET, MAGNESIUM, CBC W DIFF, TSH, LIPIDS, AND CK-MB  Follow-Up:  Your physician wants you to follow-up in: [place]  Signed: Ena Dawley, MD  03/09/2016 12:08 PM  [facility] [address] [place], [place]  [address] Phone: [phone] Fax: [phone]

## 2016-03-09 NOTE — Patient Instructions (Signed)
Medication Instructions:   STOP TAKING PLAVIX NOW  STOP TAKING LIPITOR NOW  START TAKING ROSUVASTATIN (CRESTOR) 5 MG ONCE DAILY    Labwork:  TODAY--CMET, MAGNESIUM, CBC W DIFF, TSH, LIPIDS, AND CK-MB     Testing/Procedures:  Your physician has requested that you have an echocardiogram. Echocardiography is a painless test that uses sound waves to create images of your heart. It provides your doctor with information about the size and shape of your heart and how well your heart's chambers and valves are working. This procedure takes approximately one hour. There are no restrictions for this procedure.    Follow-Up:  Your physician wants you to follow-up in: [place] will receive a reminder letter in the mail two months in advance. If you don't receive a letter, please call our office to schedule the follow-up appointment.       If you need a refill on your cardiac medications before your next appointment, please call your pharmacy.

## 2016-03-10 ENCOUNTER — Telehealth: Payer: Self-pay | Admitting: *Deleted

## 2016-03-10 DIAGNOSIS — E785 Hyperlipidemia, unspecified: Secondary | ICD-10-CM

## 2016-03-10 DIAGNOSIS — I2510 Atherosclerotic heart disease of native coronary artery without angina pectoris: Secondary | ICD-10-CM

## 2016-03-10 LAB — CBC WITH DIFFERENTIAL/PLATELET
Basophils Absolute: 0 10*3/uL (ref 0.0–0.2)
Basos: 1 %
EOS (ABSOLUTE): 0.3 10*3/uL (ref 0.0–0.4)
Eos: 3 %
Hematocrit: 42.1 % (ref 34.0–46.6)
Hemoglobin: 14.2 g/dL (ref 11.1–15.9)
Immature Grans (Abs): 0 10*3/uL (ref 0.0–0.1)
Immature Granulocytes: 0 %
Lymphocytes Absolute: 2.6 10*3/uL (ref 0.7–3.1)
Lymphs: 33 %
MCH: 30.7 pg (ref 26.6–33.0)
MCHC: 33.7 g/dL (ref 31.5–35.7)
MCV: 91 fL (ref 79–97)
Monocytes Absolute: 0.8 10*3/uL (ref 0.1–0.9)
Monocytes: 10 %
Neutrophils Absolute: 4.3 10*3/uL (ref 1.4–7.0)
Neutrophils: 53 %
Platelets: 342 10*3/uL (ref 150–379)
RBC: 4.63 x10E6/uL (ref 3.77–5.28)
RDW: 13.2 % (ref 12.3–15.4)
WBC: 8.1 10*3/uL (ref 3.4–10.8)

## 2016-03-10 LAB — CREATININE KINASE MB: CK-MB Index: 3.9 ng/mL (ref 0.0–5.3)

## 2016-03-10 LAB — COMPREHENSIVE METABOLIC PANEL
ALT: 29 IU/L (ref 0–32)
AST: 22 IU/L (ref 0–40)
Albumin/Globulin Ratio: 1.8 (ref 1.2–2.2)
Albumin: 4.6 g/dL (ref 3.5–4.8)
Alkaline Phosphatase: 86 IU/L (ref 39–117)
BUN/Creatinine Ratio: 31 — ABNORMAL HIGH (ref 12–28)
BUN: 26 mg/dL (ref 8–27)
Bilirubin Total: 0.4 mg/dL (ref 0.0–1.2)
CO2: 23 mmol/L (ref 18–29)
Calcium: 10.3 mg/dL (ref 8.7–10.3)
Chloride: 98 mmol/L (ref 96–106)
Creatinine, Ser: 0.83 mg/dL (ref 0.57–1.00)
GFR calc Af Amer: 82 mL/min/{1.73_m2} (ref >59)
GFR calc non Af Amer: 71 mL/min/{1.73_m2} (ref >59)
Globulin, Total: 2.5 g/dL (ref 1.5–4.5)
Glucose: 104 mg/dL — ABNORMAL HIGH (ref 65–99)
Potassium: 4.2 mmol/L (ref 3.5–5.2)
Sodium: 140 mmol/L (ref 134–144)
Total Protein: 7.1 g/dL (ref 6.0–8.5)

## 2016-03-10 LAB — LIPID PANEL
Chol/HDL Ratio: 4.3 ratio units (ref 0.0–4.4)
Cholesterol, Total: 194 mg/dL (ref 100–199)
HDL: 45 mg/dL (ref >39)
LDL Calculated: 115 mg/dL — ABNORMAL HIGH (ref 0–99)
Triglycerides: 170 mg/dL — ABNORMAL HIGH (ref 0–149)
VLDL Cholesterol Cal: 34 mg/dL (ref 5–40)

## 2016-03-10 LAB — TSH: TSH: 1.97 u[IU]/mL (ref 0.450–4.500)

## 2016-03-10 LAB — MAGNESIUM: Magnesium: 1.9 mg/dL (ref 1.6–2.3)

## 2016-03-10 NOTE — Telephone Encounter (Signed)
Notified the pt that per Dr Meda Coffee, all her labs were normal except she has elevated LDL.  Informed the pt that per Dr Meda Coffee, she recommends that we recheck her lipids and cmet x 2 months, with the new start of Rosuvastatin.  Scheduled the pt a lab appt for 05/11/16 to repeat CMET and Lipids.  Advised the pt that she should come fasting to this lab appt.  Pt verbalized understanding and agrees with this plan.

## 2016-03-10 NOTE — Telephone Encounter (Signed)
-----   Message from Dorothy Spark, MD sent at 03/10/2016 12:01 PM EST ----- All labs normal except elevated LDL, we will recheck lipids and CMP in 2 months with new rosuvastatin.

## 2016-03-11 ENCOUNTER — Ambulatory Visit (INDEPENDENT_AMBULATORY_CARE_PROVIDER_SITE_OTHER): Payer: Medicare Other | Admitting: Family Medicine

## 2016-03-11 ENCOUNTER — Encounter: Payer: Self-pay | Admitting: Family Medicine

## 2016-03-11 VITALS — BP 120/68 | HR 78 | Temp 97.9°F | Resp 16 | Ht 65.0 in | Wt 176.2 lb

## 2016-03-11 DIAGNOSIS — E559 Vitamin D deficiency, unspecified: Secondary | ICD-10-CM

## 2016-03-11 DIAGNOSIS — Z0000 Encounter for general adult medical examination without abnormal findings: Secondary | ICD-10-CM

## 2016-03-11 NOTE — Assessment & Plan Note (Signed)
Check labs.  Replete prn. 

## 2016-03-11 NOTE — Progress Notes (Signed)
Pre visit review using our clinic review tool, if applicable. No additional management support is needed unless otherwise documented below in the visit note. 

## 2016-03-11 NOTE — Patient Instructions (Signed)
Follow up in 6 months to recheck BP and cholesterol We'll notify you of your lab results and make any changes if needed Continue to work on healthy diet and regular exercise- you look great! Get your Pneumovax at the pharmacy along w/ the flu shot Call with any questions or concerns Happy New Year!!!

## 2016-03-11 NOTE — Assessment & Plan Note (Signed)
Pt's PE WNL.  UTD on mammo, colonoscopy.  Due for Pneumovax- plans to get at pharmacy (along w/ flu shot).  Reviewed recent labs done at [facility].  No changes at this time.  Will follow.

## 2016-03-11 NOTE — Progress Notes (Signed)
   Subjective:    Patient ID: Ann Wagner, female    DOB: 08-Sep-1945, 71 y.o.   MRN: [id number]  HPI CPE- pt had Medicare Wellness visit w/ [name] in October, due for CPE.  UTD on colonoscopy (due 2021), UTD on mammo.  Due for Pneumovax- pt plans to do at [facility] along w/ flu shot  Risk Factors: HTN- chronic problem, on Amlodipine, HCTZ, Lisinopril w/ good control.  Reviewed recent labs, no changes at this time  Hyperlipidemia- chronic problem, on Crestor daily per Dr Meda Coffee.  Reviewed recent labs.  Hypothyroid- chronic problem, on Levothyroxine daily.  TSH WNL.   Review of Systems Patient reports no vision/ hearing changes, adenopathy,fever, weight change,  persistant/recurrent hoarseness , swallowing issues, chest pain, palpitations, edema, persistant/recurrent cough, hemoptysis, dyspnea (rest/exertional/paroxysmal nocturnal), gastrointestinal bleeding (melena, rectal bleeding), abdominal pain, significant heartburn, bowel changes, GU symptoms (dysuria, hematuria, incontinence), Gyn symptoms (abnormal  bleeding, pain),  syncope, focal weakness, memory loss, numbness & tingling, skin/hair/nail changes, abnormal bruising or bleeding, anxiety, or depression.     Objective:   Physical Exam General Appearance:    Alert, cooperative, no distress, appears stated age  Head:    Normocephalic, without obvious abnormality, atraumatic  Eyes:    PERRL, conjunctiva/corneas clear, EOM's intact, fundi    benign, both eyes  Ears:    Normal TM's and external ear canals, both ears  Nose:   Nares normal, septum midline, mucosa normal, no drainage    or sinus tenderness  Throat:   Lips, mucosa, and tongue normal; teeth and gums normal  Neck:   Supple, symmetrical, trachea midline, no adenopathy;    Thyroid: no enlargement/tenderness/nodules  Back:     Symmetric, no curvature, ROM normal, no CVA tenderness  Lungs:     Clear to auscultation bilaterally, respirations unlabored  Chest Wall:    No  tenderness or deformity   Heart:    Regular rate and rhythm, S1 and S2 normal, no murmur, rub   or gallop  Breast Exam:    Deferred to mammo  Abdomen:     Soft, non-tender, bowel sounds active all four quadrants,    no masses, no organomegaly  Genitalia:    Deferred to GYN  Rectal:    Extremities:   Extremities normal, atraumatic, no cyanosis or edema  Pulses:   2+ and symmetric all extremities  Skin:   Skin color, texture, turgor normal, no rashes or lesions  Lymph nodes:   Cervical, supraclavicular, and axillary nodes normal  Neurologic:   CNII-XII intact, normal strength, sensation and reflexes    throughout          Assessment & Plan:

## 2016-03-12 LAB — SPECIMEN STATUS

## 2016-03-14 LAB — VITAMIN D 25 HYDROXY (VIT D DEFICIENCY, FRACTURES): Vit D, 25-Hydroxy: 20 ng/mL — ABNORMAL LOW (ref 30.0–100.0)

## 2016-03-15 ENCOUNTER — Other Ambulatory Visit: Payer: Self-pay | Admitting: General Practice

## 2016-03-15 MED ORDER — VITAMIN D (ERGOCALCIFEROL) 1.25 MG (50000 UNIT) PO CAPS: 50000.0000 [IU] | ORAL_CAPSULE | ORAL | 0 refills | Status: DC

## 2016-03-22 ENCOUNTER — Other Ambulatory Visit: Payer: Self-pay | Admitting: Nurse Practitioner

## 2016-03-22 ENCOUNTER — Other Ambulatory Visit: Payer: Self-pay | Admitting: Family Medicine

## 2016-03-22 NOTE — Telephone Encounter (Signed)
Rx refill sent to pharmacy. 

## 2016-04-04 ENCOUNTER — Ambulatory Visit (HOSPITAL_COMMUNITY): Payer: Medicare Other | Attending: Cardiology

## 2016-04-19 ENCOUNTER — Other Ambulatory Visit: Payer: Self-pay | Admitting: Family Medicine

## 2016-05-04 ENCOUNTER — Encounter: Payer: Self-pay | Admitting: Cardiology

## 2016-05-11 ENCOUNTER — Other Ambulatory Visit: Payer: Medicare Other | Admitting: *Deleted

## 2016-05-11 DIAGNOSIS — I2510 Atherosclerotic heart disease of native coronary artery without angina pectoris: Secondary | ICD-10-CM

## 2016-05-11 DIAGNOSIS — E785 Hyperlipidemia, unspecified: Secondary | ICD-10-CM

## 2016-05-17 ENCOUNTER — Telehealth: Payer: Self-pay | Admitting: *Deleted

## 2016-05-17 LAB — COMPREHENSIVE METABOLIC PANEL
ALT: 30 IU/L (ref 0–32)
AST: 25 IU/L (ref 0–40)
Albumin/Globulin Ratio: 2.4 — ABNORMAL HIGH (ref 1.2–2.2)
Albumin: 4.7 g/dL (ref 3.5–4.8)
Alkaline Phosphatase: 85 IU/L (ref 39–117)
BUN/Creatinine Ratio: 20 (ref 12–28)
BUN: 19 mg/dL (ref 8–27)
Bilirubin Total: 0.3 mg/dL (ref 0.0–1.2)
CO2: 19 mmol/L (ref 18–29)
Calcium: 9.9 mg/dL (ref 8.7–10.3)
Chloride: 105 mmol/L (ref 96–106)
Creatinine, Ser: 0.93 mg/dL (ref 0.57–1.00)
GFR calc Af Amer: 72 mL/min/{1.73_m2} (ref >59)
GFR calc non Af Amer: 62 mL/min/{1.73_m2} (ref >59)
Globulin, Total: 2 g/dL (ref 1.5–4.5)
Glucose: 117 mg/dL — ABNORMAL HIGH (ref 65–99)
Potassium: 4.2 mmol/L (ref 3.5–5.2)
Sodium: 144 mmol/L (ref 134–144)
Total Protein: 6.7 g/dL (ref 6.0–8.5)

## 2016-05-17 LAB — LIPID PANEL
Chol/HDL Ratio: 4.1 ratio units (ref 0.0–4.4)
Cholesterol, Total: 180 mg/dL (ref 100–199)
HDL: 44 mg/dL (ref >39)
LDL Calculated: 95 mg/dL (ref 0–99)
Triglycerides: 207 mg/dL — ABNORMAL HIGH (ref 0–149)
VLDL Cholesterol Cal: 41 mg/dL — ABNORMAL HIGH (ref 5–40)

## 2016-05-17 MED ORDER — FENOFIBRATE 160 MG PO TABS: 160.0000 mg | ORAL_TABLET | Freq: Every day | ORAL | 2 refills | Status: DC

## 2016-05-17 NOTE — Telephone Encounter (Signed)
-----   Message from Dorothy Spark, MD sent at 05/17/2016  5:31 PM EDT ----- Normal kidney and liver function. LDL and HDL at goal. TG are elevated, I would switch fish oil to fenofibrate 160 mg po daily.

## 2016-05-17 NOTE — Telephone Encounter (Signed)
Notified the pt that per Dr Meda Coffee, her labs showed normal kidney and liver function, LDL and HDL at goal. TG are elevated, and she recommends that we switch fish oil to fenofibrate 160 mg po daily. Discontinued her fish oil.  Confirmed the pharmacy of choice with the pt.  Pt verbalized understanding and agrees with this plan.

## 2016-05-27 ENCOUNTER — Other Ambulatory Visit: Payer: Self-pay | Admitting: Family Medicine

## 2016-05-30 ENCOUNTER — Other Ambulatory Visit: Payer: Self-pay

## 2016-05-30 ENCOUNTER — Ambulatory Visit (HOSPITAL_COMMUNITY): Payer: Medicare Other | Attending: Cardiovascular Disease

## 2016-05-30 DIAGNOSIS — I2510 Atherosclerotic heart disease of native coronary artery without angina pectoris: Secondary | ICD-10-CM | POA: Insufficient documentation

## 2016-05-30 DIAGNOSIS — R9431 Abnormal electrocardiogram [ECG] [EKG]: Secondary | ICD-10-CM | POA: Diagnosis not present

## 2016-05-30 DIAGNOSIS — I10 Essential (primary) hypertension: Secondary | ICD-10-CM | POA: Insufficient documentation

## 2016-05-30 DIAGNOSIS — Z87891 Personal history of nicotine dependence: Secondary | ICD-10-CM | POA: Diagnosis not present

## 2016-05-30 DIAGNOSIS — Z8249 Family history of ischemic heart disease and other diseases of the circulatory system: Secondary | ICD-10-CM | POA: Diagnosis not present

## 2016-05-30 DIAGNOSIS — E785 Hyperlipidemia, unspecified: Secondary | ICD-10-CM | POA: Diagnosis not present

## 2016-06-01 ENCOUNTER — Other Ambulatory Visit: Payer: Self-pay | Admitting: General Practice

## 2016-06-01 ENCOUNTER — Telehealth: Payer: Self-pay | Admitting: Family Medicine

## 2016-06-01 MED ORDER — PAROXETINE HCL 10 MG PO TABS: 10.0000 mg | ORAL_TABLET | Freq: Every day | ORAL | 1 refills | Status: DC

## 2016-06-01 NOTE — Telephone Encounter (Signed)
Pt states that the refill for PARoxetine, should have been sent to [facility] on [address].

## 2016-06-01 NOTE — Telephone Encounter (Signed)
Med filled to local pharmacy. 

## 2016-07-03 ENCOUNTER — Other Ambulatory Visit: Payer: Self-pay | Admitting: Physician Assistant

## 2016-07-04 NOTE — Telephone Encounter (Signed)
Refill Request.  

## 2016-07-20 ENCOUNTER — Other Ambulatory Visit: Payer: Self-pay | Admitting: Family Medicine

## 2016-08-23 ENCOUNTER — Other Ambulatory Visit: Payer: Self-pay | Admitting: Family Medicine

## 2016-08-23 ENCOUNTER — Encounter: Payer: Self-pay | Admitting: Physician Assistant

## 2016-08-23 ENCOUNTER — Ambulatory Visit (INDEPENDENT_AMBULATORY_CARE_PROVIDER_SITE_OTHER): Payer: Medicare Other | Admitting: Physician Assistant

## 2016-08-23 VITALS — BP 120/58 | HR 51 | Temp 98.3°F | Resp 14 | Ht 65.0 in | Wt 183.0 lb

## 2016-08-23 DIAGNOSIS — S91312A Laceration without foreign body, left foot, initial encounter: Secondary | ICD-10-CM | POA: Diagnosis not present

## 2016-08-23 DIAGNOSIS — Z23 Encounter for immunization: Secondary | ICD-10-CM

## 2016-08-23 MED ORDER — CEPHALEXIN 500 MG PO CAPS: 500.0000 mg | ORAL_CAPSULE | Freq: Two times a day (BID) | ORAL | 0 refills | Status: AC

## 2016-08-23 MED ORDER — NITROGLYCERIN 0.4 MG SL SUBL: SUBLINGUAL_TABLET | SUBLINGUAL | 1 refills | Status: DC

## 2016-08-23 NOTE — Telephone Encounter (Signed)
Please advise. Pt is asking for #300 nitroglycerin, I feel like this is a very large quantity

## 2016-08-23 NOTE — Patient Instructions (Signed)
Please keep skin clean and dry. Continue topical antibiotic to the area. The area looks like it is healing nicely. If you note any redness, hardness of skin or tenderness around the area, start the antibiotic prescription I have printed and given to you. If you start the antibiotic, give me a call.  Follow-up if symptoms are not resolving.

## 2016-08-23 NOTE — Progress Notes (Signed)
Pre visit review using our clinic review tool, if applicable. No additional management support is needed unless otherwise documented below in the visit note. 

## 2016-08-23 NOTE — Progress Notes (Signed)
Patient presents to clinic today c/o superficial scratch/abrasion of dorsal left foot occurring 3 days ago. Notes scratching her foot on a metal door bracket of a port-a-potty. Notes some tenderness and swelling initially. Has improved since that time but mild "puffiness" still present. Denies fever, chills, malaise or fatigue. Denies pus or drainage from site.   Past Medical History:  Diagnosis Date  . Allergic rhinitis   . Arthritis   . Coronary artery disease    cath 01/12/2015 single vessel CAD, DES to mid LAD  . GERD (gastroesophageal reflux disease)   . Headache   . History of chicken pox   . Hyperlipidemia   . Hypertension   . Hyperthyroidism   . Syncope     Current Outpatient Prescriptions on File Prior to Visit  Medication Sig Dispense Refill  . amLODipine (NORVASC) 10 MG tablet TAKE 1 TABLET BY MOUTH EVERY DAY 30 tablet 6  . aspirin EC 81 MG tablet Take 1 tablet (81 mg total) by mouth daily.    . fenofibrate 160 MG tablet Take 1 tablet (160 mg total) by mouth daily. 90 tablet 2  . hydrochlorothiazide (HYDRODIURIL) 25 MG tablet TAKE 1 TABLET(25 MG) BY MOUTH DAILY 90 tablet 1  . levothyroxine (SYNTHROID, LEVOTHROID) 75 MCG tablet TAKE 1 TABLET BY MOUTH EVERY DAY 30 tablet 6  . lisinopril (PRINIVIL,ZESTRIL) 40 MG tablet TAKE 1 TABLET BY MOUTH EVERY DAY 30 tablet 6  . pantoprazole (PROTONIX) 40 MG tablet TAKE 1 TABLET BY MOUTH DAILY AT NOON 90 tablet 0  . PARoxetine (PAXIL) 10 MG tablet Take 1 tablet (10 mg total) by mouth daily. 90 tablet 1  . rosuvastatin (CRESTOR) 5 MG tablet Take 1 tablet (5 mg total) by mouth daily. 90 tablet 3   No current facility-administered medications on file prior to visit.     Allergies  Allergen Reactions  . Propoxyphene N-Acetaminophen Other (See Comments)    Made sick    Family History  Problem Relation Age of Onset  . Coronary artery disease Mother   . Hypertension Mother   . Heart attack Mother   . Diabetes Mother   . Coronary  artery disease Father   . Hypertension Father   . Coronary artery disease Paternal Grandmother   . Hypertension Paternal Grandmother   . Stroke Neg Hx   . Cancer Neg Hx     Social History   Social History  . Marital status: Divorced    Spouse name: N/A  . Number of children: N/A  . Years of education: N/A   Social History Main Topics  . Smoking status: Former Research scientist (life sciences)  . Smokeless tobacco: Never Used  . Alcohol use Yes     Comment: socially  . Drug use: No  . Sexual activity: Not Asked   Other Topics Concern  . None   Social History Narrative   Lives alone.   2 sons - 1 locally with 5 kids.   Retired Nurse, learning disability, enjoys antique shows.   Review of Systems - See HPI.  All other ROS are negative.  BP (!) 120/58   Pulse (!) 51   Temp 98.3 F (36.8 C) (Oral)   Resp 14   Ht 5\' 5"  (1.651 m)   Wt 183 lb (83 kg)   SpO2 98%   BMI 30.45 kg/m   Physical Exam  Constitutional: She is well-developed, well-nourished, and in no distress.  HENT:  Head: Normocephalic and atraumatic.  Eyes: Conjunctivae are normal.  Cardiovascular: Normal  rate, regular rhythm, normal heart sounds and intact distal pulses.   Pulmonary/Chest: Effort normal.  Skin: Skin is warm and dry.     Psychiatric: Affect normal.  Vitals reviewed.  Assessment/Plan: 1. Superficial laceration of left foot, initial encounter Healed on its own. No sign of active infection. Mild swelling around the area without induration or fluctuance. No erythema or warmth. Supportive measures reviewed. Wound care discussed. Rx Keflex printed for patient to start if there is any erythema, hardess, increased tenderness of the area. Follow-up/Return precautions discussed with patient.  - Td : Tetanus/diphtheria >7yo Preservative  free   Leeanne Rio, PA-C

## 2016-09-05 ENCOUNTER — Other Ambulatory Visit: Payer: Self-pay | Admitting: Family Medicine

## 2016-09-27 ENCOUNTER — Other Ambulatory Visit: Payer: Self-pay | Admitting: Family Medicine

## 2016-09-27 ENCOUNTER — Other Ambulatory Visit: Payer: Self-pay | Admitting: Cardiology

## 2016-09-27 NOTE — Telephone Encounter (Signed)
Medication filled to pharmacy as requested.   

## 2016-10-14 ENCOUNTER — Other Ambulatory Visit: Payer: Self-pay | Admitting: Family Medicine

## 2016-11-05 ENCOUNTER — Other Ambulatory Visit: Payer: Self-pay | Admitting: Family Medicine

## 2016-11-07 ENCOUNTER — Other Ambulatory Visit: Payer: Self-pay | Admitting: Family Medicine

## 2016-11-10 ENCOUNTER — Other Ambulatory Visit: Payer: Self-pay | Admitting: Family Medicine

## 2016-12-11 ENCOUNTER — Other Ambulatory Visit: Payer: Self-pay | Admitting: Family Medicine

## 2016-12-12 ENCOUNTER — Ambulatory Visit (INDEPENDENT_AMBULATORY_CARE_PROVIDER_SITE_OTHER): Payer: Medicare Other | Admitting: Physician Assistant

## 2016-12-12 ENCOUNTER — Encounter: Payer: Self-pay | Admitting: Physician Assistant

## 2016-12-12 VITALS — BP 134/60 | HR 52 | Temp 98.0°F | Resp 14 | Ht 65.0 in | Wt 182.0 lb

## 2016-12-12 DIAGNOSIS — J9801 Acute bronchospasm: Secondary | ICD-10-CM | POA: Diagnosis not present

## 2016-12-12 MED ORDER — BENZONATATE 100 MG PO CAPS: 100.0000 mg | ORAL_CAPSULE | Freq: Two times a day (BID) | ORAL | 0 refills | Status: DC | PRN

## 2016-12-12 MED ORDER — ALBUTEROL SULFATE HFA 108 (90 BASE) MCG/ACT IN AERS: 2.0000 | INHALATION_SPRAY | Freq: Four times a day (QID) | RESPIRATORY_TRACT | 0 refills | Status: AC | PRN

## 2016-12-12 NOTE — Progress Notes (Signed)
Subjective:     Ann Wagner is a 71 y.o. female who presents for evaluation of symptoms of a URI. Symptoms include bilateral ear pressure/pain, achiness, congestion, cough described as nonproductive, lightheadedness, nasal congestion, non productive cough, sinus pressure and sore throat. Onset of symptoms was 1 week ago, and has been gradually worsening since that time. Endorses recent local travel but no foreign travel. Denies sick contact. Treatment to date: Mucloytics  The following portions of the patient's history were reviewed and updated as appropriate: allergies, current medications, past family history, past medical history, past social history, past surgical history and problem list.  Review of Systems Pertinent items are noted in HPI.   Objective:    BP 134/60   Pulse (!) 52   Temp 98 F (36.7 C) (Oral)   Resp 14   Ht 5\' 5"  (1.651 m)   Wt 182 lb (82.6 kg)   SpO2 98%   BMI 30.29 kg/m  General appearance: alert, cooperative, appears stated age and no distress Head: Normocephalic, without obvious abnormality, atraumatic Ears: normal TM's and external ear canals both ears Nose: Nares normal. Septum midline. Mucosa normal. No drainage or sinus tenderness. Throat: lips, mucosa, and tongue normal; teeth and gums normal Lungs: clear to auscultation bilaterally Heart: regular rate and rhythm, S1, S2 normal, no murmur, click, rub or gallop Extremities: extremities normal, atraumatic, no cyanosis or edema Pulses: 2+ and symmetric Lymph nodes: Cervical, supraclavicular, and axillary nodes normal. Neurologic: Alert and oriented X 3, normal strength and tone. Normal symmetric reflexes. Normal coordination and gait   Assessment:    viral upper respiratory illness   Plan:    Discussed diagnosis and treatment of URI. Suggested symptomatic OTC remedies. Nasal saline spray for congestion. Follow up as needed. Start Claritin. Tessalon and Albuterol per orders

## 2016-12-12 NOTE — Progress Notes (Signed)
Pre visit review using our clinic review tool, if applicable. No additional management support is needed unless otherwise documented below in the visit note. 

## 2016-12-12 NOTE — Patient Instructions (Signed)
Please stay well-hydrated and get plenty of rest.  Start a daily Claritin. Use the Tessalon as directed for cough. The inhaler is to be used as directed for chest tightness or wheeze.  Follow-up if symptoms are not resolving or if new symptoms develop.   How to Use a Metered Dose Inhaler A metered dose inhaler is a handheld device for taking medicine that must be breathed into the lungs (inhaled). The device can be used to deliver a variety of inhaled medicines, including:  Quick relief or rescue medicines, such as bronchodilators.  Controller medicines, such as corticosteroids.  The medicine is delivered by pushing down on a metal canister to release a preset amount of spray and medicine. Each device contains the amount of medicine that is needed for a preset number of uses (inhalations). Your health care provider may recommend that you use a spacer with your inhaler to help you take the medicine more effectively. A spacer is a plastic tube with a mouthpiece on one end and an opening that connects to the inhaler on the other end. A spacer holds the medicine in a tube for a short time, which allows you to inhale more medicine. What are the risks? If you do not use your inhaler correctly, medicine might not reach your lungs to help you breathe. Inhaler medicine can cause side effects, such as:  Mouth or throat infection.  Cough.  Hoarseness.  Headache.  Nausea and vomiting.  Lung infection (pneumonia) in people who have a lung condition called COPD.  How to use a metered dose inhaler without a spacer 1. Remove the cap from the inhaler. 2. If you are using the inhaler for the first time, shake it for 5 seconds, turn it away from your face, then release 4 puffs into the air. This is called priming. 3. Shake the inhaler for 5 seconds. 4. Position the inhaler so the top of the canister faces up. 5. Put your index finger on the top of the medicine canister. Support the bottom of the  inhaler with your thumb. 6. Breathe out normally and as completely as possible, away from the inhaler. 7. Either place the inhaler between your teeth and close your lips tightly around the mouthpiece, or hold the inhaler 1-2 inches (2.5-5 cm) away from your open mouth. Keep your tongue down out of the way. If you are unsure which technique to use, ask your health care provider. 8. Press the canister down with your index finger to release the medicine, then inhale deeply and slowly through your mouth (not your nose) until your lungs are completely filled. Inhaling should take 4-6 seconds. 9. Hold the medicine in your lungs for 5-10 seconds (10 seconds is best). This helps the medicine get into the small airways of your lungs. 10. With your lips in a tight circle (pursed), breathe out slowly. 11. Repeat steps 3-10 until you have taken the number of puffs that your health care provider directed. Wait about 1 minute between puffs or as directed. 12. Put the cap on the inhaler. 13. If you are using a steroid inhaler, rinse your mouth with water, gargle, and spit out the water. Do not swallow the water. How to use a metered dose inhaler with a spacer 1. Remove the cap from the inhaler. 2. If you are using the inhaler for the first time, shake it for 5 seconds, turn it away from your face, then release 4 puffs into the air. This is called priming. 3. Shake  the inhaler for 5 seconds. 4. Place the open end of the spacer onto the inhaler mouthpiece. 5. Position the inhaler so the top of the canister faces up and the spacer mouthpiece faces you. 6. Put your index finger on the top of the medicine canister. Support the bottom of the inhaler and the spacer with your thumb. 7. Breathe out normally and as completely as possible, away from the spacer. 8. Place the spacer between your teeth and close your lips tightly around it. Keep your tongue down out of the way. 9. Press the canister down with your index finger  to release the medicine, then inhale deeply and slowly through your mouth (not your nose) until your lungs are completely filled. Inhaling should take 4-6 seconds. 10. Hold the medicine in your lungs for 5-10 seconds (10 seconds is best). This helps the medicine get into the small airways of your lungs. 11. With your lips in a tight circle (pursed), breathe out slowly. 12. Repeat steps 3-11 until you have taken the number of puffs that your health care provider directed. Wait about 1 minute between puffs or as directed. 13. Remove the spacer from the inhaler and put the cap on the inhaler. 14. If you are using a steroid inhaler, rinse your mouth with water, gargle, and spit out the water. Do not swallow the water. Follow these instructions at home:  Take your inhaled medicine only as told by your health care provider. Do not use the inhaler more than directed by your health care provider.  Keep all follow-up visits as told by your health care provider. This is important.  If your inhaler has a counter, you can check it to determine how full your inhaler is. If your inhaler does not have a counter, ask your health care provider when you will need to refill your inhaler and write the refill date on a calendar or on your inhaler canister. Note that you cannot know when an inhaler is empty by shaking it.  Follow directions on the package insert for care and cleaning of your inhaler and spacer. Contact a health care provider if:  Symptoms are only partially relieved with your inhaler.  You are having trouble using your inhaler.  You have an increase in phlegm.  You have headaches. Get help right away if:  You feel little or no relief after using your inhaler.  You have dizziness.  You have a fast heart rate.  You have chills or a fever.  You have night sweats.  There is blood in your phlegm. Summary  A metered dose inhaler is a handheld device for taking medicine that must be  breathed into the lungs (inhaled).  The medicine is delivered by pushing down on a metal canister to release a preset amount of spray and medicine.  Each device contains the amount of medicine that is needed for a preset number of uses (inhalations). This information is not intended to replace advice given to you by your health care provider. Make sure you discuss any questions you have with your health care provider. Document Released: 02/14/2005 Document Revised: 01/05/2016 Document Reviewed: 01/05/2016 Elsevier Interactive Patient Education  2017 [employer].

## 2017-01-03 ENCOUNTER — Other Ambulatory Visit: Payer: Self-pay | Admitting: Family Medicine

## 2017-01-07 ENCOUNTER — Other Ambulatory Visit: Payer: Self-pay | Admitting: Family Medicine

## 2017-01-10 ENCOUNTER — Other Ambulatory Visit: Payer: Self-pay | Admitting: Family Medicine

## 2017-01-10 ENCOUNTER — Telehealth: Payer: Self-pay | Admitting: Family Medicine

## 2017-01-10 MED ORDER — AMLODIPINE BESYLATE 10 MG PO TABS: 10.0000 mg | ORAL_TABLET | Freq: Every day | ORAL | 0 refills | Status: DC

## 2017-01-10 NOTE — Telephone Encounter (Signed)
Pt called back, she states she has made an appt to get her BP/Cholesterol checked for when she returns. I informed pt CMA is getting approval for refill. Pt stated she did not need any medication sent to [place].

## 2017-01-10 NOTE — Telephone Encounter (Signed)
It has been noted that patient did not need refills sent to [place].       Norvasc has been sent to local pharmacy.

## 2017-01-10 NOTE — Telephone Encounter (Signed)
Copied from [place] [phone]. Topic: Quick Communication - See Telephone Encounter >> Jan 10, 2017  9:25 AM Cleaster Corin, NT wrote: Reason for CRM:pt  Ann Wagner is going out of town tomorrow and needs rx. (amlodipine) Norvasc 10 mg TODAY 11/10/16 ([facility] on [address]) if possible pt. Will be out of town for 5 weeks and will have all others refills sent to [facility] in [place] for notification. See Telephone encounter for: 01/10/17.

## 2017-01-10 NOTE — Telephone Encounter (Signed)
Left message for patient to call to see which medications need to be sent to [place].    Also - Patient is overdue for BP/Cholesterol check so appt needs to be made.  I will get approval from PCP to fill medications since patient is out of town... But appt needs to be made for when she returns.

## 2017-01-10 NOTE — Telephone Encounter (Signed)
Ok to refill Amlodipine #90, no refills

## 2017-01-22 ENCOUNTER — Other Ambulatory Visit: Payer: Self-pay | Admitting: Family Medicine

## 2017-02-02 ENCOUNTER — Other Ambulatory Visit: Payer: Self-pay | Admitting: Family Medicine

## 2017-02-10 ENCOUNTER — Other Ambulatory Visit: Payer: Self-pay | Admitting: Cardiology

## 2017-02-10 ENCOUNTER — Other Ambulatory Visit: Payer: Self-pay | Admitting: Family Medicine

## 2017-02-10 NOTE — Telephone Encounter (Signed)
Please advise pt has seen [facility] for acutes.  Last OV with PCP 03/11/16.  paxil last filled 11/10/16 #90 with 0  Pt has a CPE scheduled 04/15/17

## 2017-02-10 NOTE — Telephone Encounter (Signed)
Medication filled to pharmacy as requested.   

## 2017-02-15 DIAGNOSIS — J019 Acute sinusitis, unspecified: Secondary | ICD-10-CM | POA: Diagnosis not present

## 2017-03-13 ENCOUNTER — Other Ambulatory Visit: Payer: Self-pay | Admitting: Cardiology

## 2017-03-13 DIAGNOSIS — I2510 Atherosclerotic heart disease of native coronary artery without angina pectoris: Secondary | ICD-10-CM

## 2017-03-13 DIAGNOSIS — M791 Myalgia, unspecified site: Secondary | ICD-10-CM

## 2017-03-13 DIAGNOSIS — I10 Essential (primary) hypertension: Secondary | ICD-10-CM

## 2017-03-13 DIAGNOSIS — R9431 Abnormal electrocardiogram [ECG] [EKG]: Secondary | ICD-10-CM

## 2017-03-14 ENCOUNTER — Other Ambulatory Visit: Payer: Self-pay | Admitting: Cardiology

## 2017-03-14 DIAGNOSIS — R9431 Abnormal electrocardiogram [ECG] [EKG]: Secondary | ICD-10-CM

## 2017-03-14 DIAGNOSIS — M791 Myalgia, unspecified site: Secondary | ICD-10-CM

## 2017-03-14 DIAGNOSIS — I10 Essential (primary) hypertension: Secondary | ICD-10-CM

## 2017-03-14 DIAGNOSIS — I2510 Atherosclerotic heart disease of native coronary artery without angina pectoris: Secondary | ICD-10-CM

## 2017-03-15 ENCOUNTER — Encounter: Payer: Self-pay | Admitting: Family Medicine

## 2017-03-22 ENCOUNTER — Other Ambulatory Visit: Payer: Self-pay | Admitting: Cardiology

## 2017-03-22 NOTE — Addendum Note (Signed)
Addended by: De Burrs on: 03/22/2017 02:46 PM   Modules accepted: Orders

## 2017-04-04 NOTE — Progress Notes (Addendum)
Subjective:   Ann Wagner is a 72 y.o. female who presents for Medicare Annual (Subsequent) preventive examination.  Review of Systems:  No ROS.  Medicare Wellness Visit. Additional risk factors are reflected in the social history.  Cardiac Risk Factors include: advanced age (>62men, >53 women);dyslipidemia;hypertension;family history of premature cardiovascular disease   Sleep patterns: Sleeps 8+ hours.  Home Safety/Smoke Alarms: Feels safe in home. Smoke alarms in place.  Living environment; residence and Firearm Safety: Lives alone in [place].  Seat Belt Safety/Bike Helmet: Wears seat belt.   Female:   [id number]      Mammo-08/04/15; normal. Ordered by PCP.    Dexa scan-12/14/12; normal. Recall 3-5 years. Ordered today (GI center)    CCS-Colonoscopy 08/25/2009; Moderate diverticulitis. Recall 08/2019     Objective:     Vitals: BP 121/80   Pulse (!) 45   Temp 98 F (36.7 C) (Oral)   Resp 16   Ht 5\' 5"  (1.651 m)   Wt 181 lb 9.6 oz (82.4 kg)   SpO2 98%   BMI 30.22 kg/m   Body mass index is 30.22 kg/m.  Advanced Directives 04/05/2017 12/16/2015 01/12/2015  Does Patient Have a Medical Advance Directive? Yes Yes No;Yes  Type of Advance Directive Living will;Healthcare Power of [place]  Does patient want to make changes to medical advance directive? - No - Patient declined No - Patient declined  Copy of [place] in Chart? No - copy requested No - copy requested No - copy requested    Tobacco Social History   Tobacco Use  Smoking Status Former Smoker  Smokeless Tobacco Never Used     Counseling given: Not Answered     Past Medical History:  Diagnosis Date  . Allergic rhinitis   . Arthritis   . Coronary artery disease    cath 01/12/2015 single vessel CAD, DES to mid LAD  . GERD (gastroesophageal reflux disease)   . Headache   . History of chicken pox   . Hyperlipidemia    . Hypertension   . Hyperthyroidism   . Syncope    Past Surgical History:  Procedure Laterality Date  . CARDIAC CATHETERIZATION N/A 01/12/2015   Procedure: Left Heart Cath and Coronary Angiography;  Surgeon: Peter M Martinique, MD;  Location: [facility];  Service: Cardiovascular;  Laterality: N/A;  . CARDIAC CATHETERIZATION Right 01/12/2015   Procedure: Coronary Stent Intervention;  Surgeon: Peter M Martinique, MD;  Location: [facility];  Service: Cardiovascular;  Laterality: Right;  . CORONARY STENT PLACEMENT  01/12/2015   des to [place]  . TUBAL LIGATION     Family History  Problem Relation Age of Onset  . Coronary artery disease Mother   . Hypertension Mother   . Heart attack Mother   . Diabetes Mother   . Coronary artery disease Father   . Hypertension Father   . Coronary artery disease Paternal Grandmother   . Hypertension Paternal Grandmother   . Stroke Neg Hx   . Cancer Neg Hx    Social History   Socioeconomic History  . Marital status: Divorced    Spouse name: Not on file  . Number of children: Not on file  . Years of education: Not on file  . Highest education level: Not on file  Social Needs  . Financial resource strain: Not on file  . Food insecurity - worry: Not on file  . Food insecurity -  inability: Not on file  . Transportation needs - medical: Not on file  . Transportation needs - non-medical: Not on file  Occupational History  . Not on file  Tobacco Use  . Smoking status: Former Research scientist (life sciences)  . Smokeless tobacco: Never Used  Substance and Sexual Activity  . Alcohol use: Yes    Comment: socially  . Drug use: No  . Sexual activity: Not on file  Other Topics Concern  . Not on file  Social History Narrative   Lives alone.   2 sons - 1 locally with 5 kids.   Retired Nurse, learning disability, enjoys antique shows.    Outpatient Encounter Medications as of 04/05/2017  Medication Sig  . albuterol (PROVENTIL HFA;VENTOLIN HFA) 108 (90 Base) MCG/ACT inhaler  Inhale 2 puffs into the lungs every 6 (six) hours as needed for wheezing or shortness of breath. (Patient not taking: Reported on 04/05/2017)  . amLODipine (NORVASC) 10 MG tablet Take 1 tablet (10 mg total) daily by mouth.  Marland Kitchen aspirin EC 81 MG tablet Take 1 tablet (81 mg total) by mouth daily.  . fenofibrate 160 MG tablet TAKE 1 TABLET(160 MG) BY MOUTH DAILY  . hydrochlorothiazide (HYDRODIURIL) 25 MG tablet TAKE 1 TABLET(25 MG) BY MOUTH DAILY  . levothyroxine (SYNTHROID, LEVOTHROID) 75 MCG tablet TAKE 1 TABLET BY MOUTH EVERY DAY  . lisinopril (PRINIVIL,ZESTRIL) 40 MG tablet TAKE 1 TABLET BY MOUTH EVERY DAY  . nitroGLYCERIN (NITROSTAT) 0.4 MG SL tablet PLACE 1 TABLET UNDER TONGUE EVERY 5 MINUTES AS NEEDED FOR CHEST PAIN (Patient not taking: Reported on 04/05/2017)  . pantoprazole (PROTONIX) 40 MG tablet TAKE 1 TABLET BY MOUTH DAILY AT NOON  . PARoxetine (PAXIL) 10 MG tablet TAKE 1 TABLET(10 MG) BY MOUTH DAILY  . rosuvastatin (CRESTOR) 5 MG tablet Take 5 mg by mouth daily.  Marland Kitchen Zoster Vaccine Adjuvanted [facility]) injection Inject 0.5 mLs into the muscle once for 1 dose.  . [DISCONTINUED] benzonatate (TESSALON) 100 MG capsule Take 1 capsule (100 mg total) by mouth 2 (two) times daily as needed for cough.   No facility-administered encounter medications on file as of 04/05/2017.     Activities of Daily Living In your present state of health, do you have any difficulty performing the following activities: 04/05/2017 04/05/2017  Hearing? N N  Vision? N N  Difficulty concentrating or making decisions? N N  Walking or climbing stairs? N N  Dressing or bathing? N N  Doing errands, shopping? N N  Preparing Food and eating ? N -  Using the Toilet? N -  In the past six months, have you accidently leaked urine? N -  Do you have problems with loss of bowel control? N -  Managing your Medications? N -  Managing your Finances? N -  Housekeeping or managing your Housekeeping? N -  Some recent data might be hidden     Patient Care Team: Midge Minium, MD as PCP - General Dorothy Spark, MD as Consulting Physician (Cardiology)    Assessment:   This is a routine wellness examination for Jalaiya.  Exercise Activities and Dietary recommendations Current Exercise Habits: The patient does not participate in regular exercise at present(Stays busy with work/housekeeping), Exercise limited by: None identified   Diet (meal preparation, eat out, water intake, caffeinated beverages, dairy products, fruits and vegetables): Drinks tea, water and lemonade.   Eats heart healthy diet, 2 meals/day.   Goals    . Weight (lb) < 165 lb (74.8 kg)  Patient would like to lose weight. Plans to exercise more, walking more frequently. May participate in silver sneakers at [facility].     . Weight (lb) < 165 lb (74.8 kg)     Lose weight by increasing activity.        Fall Risk Fall Risk  04/05/2017 04/05/2017 03/11/2016 12/16/2015 05/13/2015  Falls in the past year? Yes No No No No  Comment syncopal episode - - - -  Number falls in past yr: 1 - - - -  Injury with Fall? No - - - -  Follow up Falls prevention discussed - - - -    Depression Screen PHQ 2/9 Scores 04/05/2017 04/05/2017 08/23/2016 03/11/2016  PHQ - 2 Score 0 0 0 0  PHQ- 9 Score - 0 0 0     Cognitive Function       Ad8 score reviewed for issues:  Issues making decisions: no  Less interest in hobbies / activities: no  Repeats questions, stories (family complaining): no  Trouble using ordinary gadgets (microwave, computer, phone): no  Forgets the month or year: no  Mismanaging finances: no  Remembering appts: no  Daily problems with thinking and/or memory: no Ad8 score is=0     Immunization History  Administered Date(s) Administered  . Influenza,inj,Quad PF,6+ Mos 11/29/2014  . Influenza-Unspecified 12/29/2012, 10/29/2013  . Pneumococcal Conjugate-13 04/30/2014  . Pneumococcal Polysaccharide-23 04/05/2017  . Td 08/23/2016  .  Zoster 02/28/2010      Screening Tests Health Maintenance  Topic Date Due  . INFLUENZA VACCINE  12/06/2017 (Originally 09/28/2016)  . Hepatitis C Screening  04/05/2018 (Originally 1945/07/24)  . MAMMOGRAM  08/03/2017  . COLONOSCOPY  08/29/2019  . TETANUS/TDAP  08/24/2026  . DEXA SCAN  Completed  . PNA vac Low Risk Adult  Completed       Plan:     Shingles vaccine at pharmacy.   Schedule eye exam.  Schedule mammogram and bone scan .   Schedule appts with cardiology and dermatology.   Bring a copy of your living will and/or healthcare power of attorney to your next office visit.  Continue doing brain stimulating activities (puzzles, reading, adult coloring books, staying active) to keep memory sharp.    I have personally reviewed and noted the following in the patient's chart:   . Medical and social history . Use of alcohol, tobacco or illicit drugs  . Current medications and supplements . Functional ability and status . Nutritional status . Physical activity . Advanced directives . List of other physicians . Hospitalizations, surgeries, and ER visits in previous 12 months . Vitals . Screenings to include cognitive, depression, and falls . Referrals and appointments  In addition, I have reviewed and discussed with patient certain preventive protocols, quality metrics, and best practice recommendations. A written personalized care plan for preventive services as well as general preventive health recommendations were provided to patient.     Gerilyn Nestle, RN  04/05/2017  Reviewed documentation provided by RN and agree w/ above.  Annye Asa, MD

## 2017-04-05 ENCOUNTER — Encounter: Payer: Self-pay | Admitting: Family Medicine

## 2017-04-05 ENCOUNTER — Ambulatory Visit (INDEPENDENT_AMBULATORY_CARE_PROVIDER_SITE_OTHER): Payer: Medicare Other

## 2017-04-05 ENCOUNTER — Other Ambulatory Visit: Payer: Self-pay

## 2017-04-05 ENCOUNTER — Ambulatory Visit (INDEPENDENT_AMBULATORY_CARE_PROVIDER_SITE_OTHER): Payer: Medicare Other | Admitting: Family Medicine

## 2017-04-05 VITALS — BP 121/80 | HR 45 | Temp 98.0°F | Resp 16 | Ht 65.0 in | Wt 181.4 lb

## 2017-04-05 VITALS — BP 121/80 | HR 45 | Temp 98.0°F | Resp 16 | Ht 65.0 in | Wt 181.6 lb

## 2017-04-05 DIAGNOSIS — I6523 Occlusion and stenosis of bilateral carotid arteries: Secondary | ICD-10-CM

## 2017-04-05 DIAGNOSIS — Z0000 Encounter for general adult medical examination without abnormal findings: Secondary | ICD-10-CM

## 2017-04-05 DIAGNOSIS — Z23 Encounter for immunization: Secondary | ICD-10-CM

## 2017-04-05 DIAGNOSIS — E559 Vitamin D deficiency, unspecified: Secondary | ICD-10-CM | POA: Diagnosis not present

## 2017-04-05 DIAGNOSIS — N649 Disorder of breast, unspecified: Secondary | ICD-10-CM

## 2017-04-05 DIAGNOSIS — I10 Essential (primary) hypertension: Secondary | ICD-10-CM | POA: Diagnosis not present

## 2017-04-05 DIAGNOSIS — E2839 Other primary ovarian failure: Secondary | ICD-10-CM

## 2017-04-05 DIAGNOSIS — Z1231 Encounter for screening mammogram for malignant neoplasm of breast: Secondary | ICD-10-CM

## 2017-04-05 DIAGNOSIS — L988 Other specified disorders of the skin and subcutaneous tissue: Secondary | ICD-10-CM

## 2017-04-05 LAB — BASIC METABOLIC PANEL
BUN: 25 mg/dL — ABNORMAL HIGH (ref 6–23)
CALCIUM: 9.7 mg/dL (ref 8.4–10.5)
CO2: 31 mEq/L (ref 19–32)
Chloride: 104 mEq/L (ref 96–112)
Creatinine, Ser: 0.86 mg/dL (ref 0.40–1.20)
GFR: 68.87 mL/min (ref >60.00)
Glucose, Bld: 96 mg/dL (ref 70–99)
Potassium: 4.1 mEq/L (ref 3.5–5.1)
SODIUM: 140 meq/L (ref 135–145)

## 2017-04-05 LAB — LIPID PANEL
Cholesterol: 155 mg/dL (ref 0–200)
HDL: 44.5 mg/dL (ref >39.00)
LDL CALC: 82 mg/dL (ref 0–99)
NONHDL: 110.33
Total CHOL/HDL Ratio: 3
Triglycerides: 140 mg/dL (ref 0.0–149.0)
VLDL: 28 mg/dL (ref 0.0–40.0)

## 2017-04-05 LAB — CBC WITH DIFFERENTIAL/PLATELET
BASOS ABS: 0.1 10*3/uL (ref 0.0–0.1)
BASOS PCT: 0.7 % (ref 0.0–3.0)
Eosinophils Absolute: 0.2 10*3/uL (ref 0.0–0.7)
Eosinophils Relative: 2.6 % (ref 0.0–5.0)
HCT: 41.5 % (ref 36.0–46.0)
HEMOGLOBIN: 14 g/dL (ref 12.0–15.0)
LYMPHS PCT: 36 % (ref 12.0–46.0)
Lymphs Abs: 2.7 10*3/uL (ref 0.7–4.0)
MCHC: 33.6 g/dL (ref 30.0–36.0)
MCV: 89.9 fl (ref 78.0–100.0)
Monocytes Absolute: 0.8 10*3/uL (ref 0.1–1.0)
Monocytes Relative: 10.2 % (ref 3.0–12.0)
NEUTROS ABS: 3.8 10*3/uL (ref 1.4–7.7)
Neutrophils Relative %: 50.5 % (ref 43.0–77.0)
PLATELETS: 303 10*3/uL (ref 150.0–400.0)
RBC: 4.62 Mil/uL (ref 3.87–5.11)
RDW: 13.6 % (ref 11.5–15.5)
WBC: 7.5 10*3/uL (ref 4.0–10.5)

## 2017-04-05 LAB — HEPATIC FUNCTION PANEL
ALT: 20 U/L (ref 0–35)
AST: 20 U/L (ref 0–37)
Albumin: 4.6 g/dL (ref 3.5–5.2)
Alkaline Phosphatase: 53 U/L (ref 39–117)
BILIRUBIN DIRECT: 0.1 mg/dL (ref 0.0–0.3)
BILIRUBIN TOTAL: 0.4 mg/dL (ref 0.2–1.2)
TOTAL PROTEIN: 7.1 g/dL (ref 6.0–8.3)

## 2017-04-05 LAB — VITAMIN D 25 HYDROXY (VIT D DEFICIENCY, FRACTURES): VITD: 12.34 ng/mL — ABNORMAL LOW (ref 30.00–100.00)

## 2017-04-05 LAB — TSH: TSH: 2.9 u[IU]/mL (ref 0.35–4.50)

## 2017-04-05 MED ORDER — ZOSTER VAC RECOMB ADJUVANTED 50 MCG/0.5ML IM SUSR: 0.5000 mL | Freq: Once | INTRAMUSCULAR | 1 refills | Status: AC

## 2017-04-05 NOTE — Assessment & Plan Note (Signed)
Check labs and replete prn. 

## 2017-04-05 NOTE — Assessment & Plan Note (Signed)
Chronic problem.  Well controlled today.  Asymptomatic.  Check labs.  No anticipated med changes.  Will follow. 

## 2017-04-05 NOTE — Assessment & Plan Note (Signed)
Pt's PE WNL w/ exception of obesity and hyperpigmented lesion on L breast (derm referral placed).  Due for mammo- ordered.  Pneumovax given today.  Check labs.  Anticipatory guidance provided.

## 2017-04-05 NOTE — Assessment & Plan Note (Signed)
Following w/ Dr Meda Coffee.  Due for repeat imaging.

## 2017-04-05 NOTE — Patient Instructions (Addendum)
Shingles vaccine at pharmacy.   Schedule eye exam.  Schedule mammogram and bone scan .   Schedule appts with cardiology and dermatology.   Bring a copy of your living will and/or healthcare power of attorney to your next office visit.  Continue doing brain stimulating activities (puzzles, reading, adult coloring books, staying active) to keep memory sharp.    Health Maintenance, Female Adopting a healthy lifestyle and getting preventive care can go a long way to promote health and wellness. Talk with your health care provider about what schedule of regular examinations is right for you. This is a good chance for you to check in with your provider about disease prevention and staying healthy. In between checkups, there are plenty of things you can do on your own. Experts have done a lot of research about which lifestyle changes and preventive measures are most likely to keep you healthy. Ask your health care provider for more information. Weight and diet Eat a healthy diet  Be sure to include plenty of vegetables, fruits, low-fat dairy products, and lean protein.  Do not eat a lot of foods high in solid fats, added sugars, or salt.  Get regular exercise. This is one of the most important things you can do for your health. ? Most adults should exercise for at least 150 minutes each week. The exercise should increase your heart rate and make you sweat (moderate-intensity exercise). ? Most adults should also do strengthening exercises at least twice a week. This is in addition to the moderate-intensity exercise.  Maintain a healthy weight  Body mass index (BMI) is a measurement that can be used to identify possible weight problems. It estimates body fat based on height and weight. Your health care provider can help determine your BMI and help you achieve or maintain a healthy weight.  For females 58 years of age and older: ? A BMI below 18.5 is considered underweight. ? A BMI of 18.5 to  24.9 is normal. ? A BMI of 25 to 29.9 is considered overweight. ? A BMI of 30 and above is considered obese.  Watch levels of cholesterol and blood lipids  You should start having your blood tested for lipids and cholesterol at 73 years of age, then have this test every 5 years.  You may need to have your cholesterol levels checked more often if: ? Your lipid or cholesterol levels are high. ? You are older than 73 years of age. ? You are at high risk for heart disease.  Cancer screening Lung Cancer  Lung cancer screening is recommended for adults 49-37 years old who are at high risk for lung cancer because of a history of smoking.  A yearly low-dose CT scan of the lungs is recommended for people who: ? Currently smoke. ? Have quit within the past 15 years. ? Have at least a 30-pack-year history of smoking. A pack year is smoking an average of one pack of cigarettes a day for 1 year.  Yearly screening should continue until it has been 15 years since you quit.  Yearly screening should stop if you develop a health problem that would prevent you from having lung cancer treatment.  Breast Cancer  Practice breast self-awareness. This means understanding how your breasts normally appear and feel.  It also means doing regular breast self-exams. Let your health care provider know about any changes, no matter how small.  If you are in your 20s or 30s, you should have a clinical breast exam (  CBE) by a health care provider every 1-3 years as part of a regular health exam.  If you are 70 or older, have a CBE every year. Also consider having a breast X-ray (mammogram) every year.  If you have a family history of breast cancer, talk to your health care provider about genetic screening.  If you are at high risk for breast cancer, talk to your health care provider about having an MRI and a mammogram every year.  Breast cancer gene (BRCA) assessment is recommended for women who have family  members with BRCA-related cancers. BRCA-related cancers include: ? Breast. ? Ovarian. ? Tubal. ? Peritoneal cancers.  Results of the assessment will determine the need for genetic counseling and BRCA1 and BRCA2 testing.  Cervical Cancer Your health care provider may recommend that you be screened regularly for cancer of the pelvic organs (ovaries, uterus, and vagina). This screening involves a pelvic examination, including checking for microscopic changes to the surface of your cervix (Pap test). You may be encouraged to have this screening done every 3 years, beginning at age 69.  For women ages 48-65, health care providers may recommend pelvic exams and Pap testing every 3 years, or they may recommend the Pap and pelvic exam, combined with testing for human papilloma virus (HPV), every 5 years. Some types of HPV increase your risk of cervical cancer. Testing for HPV may also be done on women of any age with unclear Pap test results.  Other health care providers may not recommend any screening for nonpregnant women who are considered low risk for pelvic cancer and who do not have symptoms. Ask your health care provider if a screening pelvic exam is right for you.  If you have had past treatment for cervical cancer or a condition that could lead to cancer, you need Pap tests and screening for cancer for at least 20 years after your treatment. If Pap tests have been discontinued, your risk factors (such as having a new sexual partner) need to be reassessed to determine if screening should resume. Some women have medical problems that increase the chance of getting cervical cancer. In these cases, your health care provider may recommend more frequent screening and Pap tests.  Colorectal Cancer  This type of cancer can be detected and often prevented.  Routine colorectal cancer screening usually begins at 73 years of age and continues through 73 years of age.  Your health care provider may  recommend screening at an earlier age if you have risk factors for colon cancer.  Your health care provider may also recommend using home test kits to check for hidden blood in the stool.  A small camera at the end of a tube can be used to examine your colon directly (sigmoidoscopy or colonoscopy). This is done to check for the earliest forms of colorectal cancer.  Routine screening usually begins at age 37.  Direct examination of the colon should be repeated every 5-10 years through 73 years of age. However, you may need to be screened more often if early forms of precancerous polyps or small growths are found.  Skin Cancer  Check your skin from head to toe regularly.  Tell your health care provider about any new moles or changes in moles, especially if there is a change in a mole's shape or color.  Also tell your health care provider if you have a mole that is larger than the size of a pencil eraser.  Always use sunscreen. Apply sunscreen liberally  and repeatedly throughout the day.  Protect yourself by wearing long sleeves, pants, a wide-brimmed hat, and sunglasses whenever you are outside.  Heart disease, diabetes, and high blood pressure  High blood pressure causes heart disease and increases the risk of stroke. High blood pressure is more likely to develop in: ? People who have blood pressure in the high end of the normal range (130-139/85-89 mm Hg). ? People who are overweight or obese. ? People who are African American.  If you are 56-44 years of age, have your blood pressure checked every 3-5 years. If you are 76 years of age or older, have your blood pressure checked every year. You should have your blood pressure measured twice-once when you are at a hospital or clinic, and once when you are not at a hospital or clinic. Record the average of the two measurements. To check your blood pressure when you are not at a hospital or clinic, you can use: ? An automated blood pressure  machine at a pharmacy. ? A home blood pressure monitor.  If you are between 8 years and 59 years old, ask your health care provider if you should take aspirin to prevent strokes.  Have regular diabetes screenings. This involves taking a blood sample to check your fasting blood sugar level. ? If you are at a normal weight and have a low risk for diabetes, have this test once every three years after 73 years of age. ? If you are overweight and have a high risk for diabetes, consider being tested at a younger age or more often. Preventing infection Hepatitis B  If you have a higher risk for hepatitis B, you should be screened for this virus. You are considered at high risk for hepatitis B if: ? You were born in a country where hepatitis B is common. Ask your health care provider which countries are considered high risk. ? Your parents were born in a high-risk country, and you have not been immunized against hepatitis B (hepatitis B vaccine). ? You have HIV or AIDS. ? You use needles to inject street drugs. ? You live with someone who has hepatitis B. ? You have had sex with someone who has hepatitis B. ? You get hemodialysis treatment. ? You take certain medicines for conditions, including cancer, organ transplantation, and autoimmune conditions.  Hepatitis C  Blood testing is recommended for: ? Everyone born from [birth year] through 1966. ? Anyone with known risk factors for hepatitis C.  Sexually transmitted infections (STIs)  You should be screened for sexually transmitted infections (STIs) including gonorrhea and chlamydia if: ? You are sexually active and are younger than 73 years of age. ? You are older than 73 years of age and your health care provider tells you that you are at risk for this type of infection. ? Your sexual activity has changed since you were last screened and you are at an increased risk for chlamydia or gonorrhea. Ask your health care provider if you are at  risk.  If you do not have HIV, but are at risk, it may be recommended that you take a prescription medicine daily to prevent HIV infection. This is called pre-exposure prophylaxis (PrEP). You are considered at risk if: ? You are sexually active and do not regularly use condoms or know the HIV status of your partner(s). ? You take drugs by injection. ? You are sexually active with a partner who has HIV.  Talk with your health care provider about whether  you are at high risk of being infected with HIV. If you choose to begin PrEP, you should first be tested for HIV. You should then be tested every 3 months for as long as you are taking PrEP. Pregnancy  If you are premenopausal and you may become pregnant, ask your health care provider about preconception counseling.  If you may become pregnant, take 400 to 800 micrograms (mcg) of folic acid every day.  If you want to prevent pregnancy, talk to your health care provider about birth control (contraception). Osteoporosis and menopause  Osteoporosis is a disease in which the bones lose minerals and strength with aging. This can result in serious bone fractures. Your risk for osteoporosis can be identified using a bone density scan.  If you are 51 years of age or older, or if you are at risk for osteoporosis and fractures, ask your health care provider if you should be screened.  Ask your health care provider whether you should take a calcium or vitamin D supplement to lower your risk for osteoporosis.  Menopause may have certain physical symptoms and risks.  Hormone replacement therapy may reduce some of these symptoms and risks. Talk to your health care provider about whether hormone replacement therapy is right for you. Follow these instructions at home:  Schedule regular health, dental, and eye exams.  Stay current with your immunizations.  Do not use any tobacco products including cigarettes, chewing tobacco, or electronic  cigarettes.  If you are pregnant, do not drink alcohol.  If you are breastfeeding, limit how much and how often you drink alcohol.  Limit alcohol intake to no more than 1 drink per day for nonpregnant women. One drink equals 12 ounces of beer, 5 ounces of wine, or 1 ounces of hard liquor.  Do not use street drugs.  Do not share needles.  Ask your health care provider for help if you need support or information about quitting drugs.  Tell your health care provider if you often feel depressed.  Tell your health care provider if you have ever been abused or do not feel safe at home. This information is not intended to replace advice given to you by your health care provider. Make sure you discuss any questions you have with your health care provider. Document Released: 08/30/2010 Document Revised: 07/23/2015 Document Reviewed: 11/18/2014 Elsevier Interactive Patient Education  [employer].

## 2017-04-05 NOTE — Progress Notes (Signed)
   Subjective:    Patient ID: Ann Wagner, female    DOB: December 26, 1945, 72 y.o.   MRN: [id number]  HPI CPE- UTD on colonoscopy, due for mammo and Pneumovax.  Declines flu.  Due for appt w/ Dr Meda Coffee   Review of Systems Patient reports no vision/ hearing changes, adenopathy,fever, weight change,  persistant/recurrent hoarseness , swallowing issues, chest pain, palpitations, edema, persistant/recurrent cough, hemoptysis, dyspnea (rest/exertional/paroxysmal nocturnal), gastrointestinal bleeding (melena, rectal bleeding), abdominal pain, significant heartburn, bowel changes, GU symptoms (dysuria, hematuria, incontinence), Gyn symptoms (abnormal  bleeding, pain),  syncope, focal weakness, memory loss, numbness & tingling, skin/hair/nail changes, abnormal bruising or bleeding, anxiety, or depression.     Objective:   Physical Exam General Appearance:    Alert, cooperative, no distress, appears stated age  Head:    Normocephalic, without obvious abnormality, atraumatic  Eyes:    PERRL, conjunctiva/corneas clear, EOM's intact, fundi    benign, both eyes  Ears:    Normal TM's and external ear canals, both ears  Nose:   Nares normal, septum midline, mucosa normal, no drainage    or sinus tenderness  Throat:   Lips, mucosa, and tongue normal; teeth and gums normal  Neck:   Supple, symmetrical, trachea midline, no adenopathy;    Thyroid: no enlargement/tenderness/nodules  Back:     Symmetric, no curvature, ROM normal, no CVA tenderness  Lungs:     Clear to auscultation bilaterally, respirations unlabored  Chest Wall:    No tenderness or deformity   Heart:    Regular rate and rhythm, S1 and S2 normal, no murmur, rub   or gallop  Breast Exam:    Deferred to mammo  Abdomen:     Soft, non-tender, bowel sounds active all four quadrants,    no masses, no organomegaly  Genitalia:    Deferred  Rectal:    Extremities:   Extremities normal, atraumatic, no cyanosis or edema  Pulses:   2+ and symmetric all  extremities  Skin:   Skin color, texture, turgor normal, no rashes or lesions  Lymph nodes:   Cervical, supraclavicular, and axillary nodes normal  Neurologic:   CNII-XII intact, normal strength, sensation and reflexes    throughout          Assessment & Plan:

## 2017-04-05 NOTE — Patient Instructions (Signed)
Follow up in 6 months to recheck BP and cholesterol We'll notify you of your lab results and make any changes if needed Continue to work on healthy diet and regular exercise- you can do it! Call and schedule an appt w/ Dr Meda Coffee I ordered the mammogram- please schedule at your convenience We'll call you with your Derm appt Call with any questions or concerns Happy Valentine's Day!!!

## 2017-04-06 ENCOUNTER — Other Ambulatory Visit: Payer: Self-pay | Admitting: General Practice

## 2017-04-06 MED ORDER — VITAMIN D (ERGOCALCIFEROL) 1.25 MG (50000 UNIT) PO CAPS: 50000.0000 [IU] | ORAL_CAPSULE | ORAL | 0 refills | Status: DC

## 2017-04-07 ENCOUNTER — Other Ambulatory Visit: Payer: Self-pay | Admitting: Family Medicine

## 2017-04-07 ENCOUNTER — Other Ambulatory Visit: Payer: Self-pay | Admitting: Cardiology

## 2017-04-07 DIAGNOSIS — I10 Essential (primary) hypertension: Secondary | ICD-10-CM

## 2017-04-07 DIAGNOSIS — R9431 Abnormal electrocardiogram [ECG] [EKG]: Secondary | ICD-10-CM

## 2017-04-07 DIAGNOSIS — I2510 Atherosclerotic heart disease of native coronary artery without angina pectoris: Secondary | ICD-10-CM

## 2017-04-07 DIAGNOSIS — M791 Myalgia, unspecified site: Secondary | ICD-10-CM

## 2017-04-24 ENCOUNTER — Other Ambulatory Visit: Payer: Self-pay | Admitting: Family Medicine

## 2017-04-27 ENCOUNTER — Ambulatory Visit
Admission: RE | Admit: 2017-04-27 | Discharge: 2017-04-27 | Disposition: A | Discharge status: Home / Self Care | Payer: Medicare Other | Source: Ambulatory Visit | Attending: Family Medicine | Admitting: Family Medicine

## 2017-04-27 DIAGNOSIS — Z1231 Encounter for screening mammogram for malignant neoplasm of breast: Secondary | ICD-10-CM | POA: Diagnosis not present

## 2017-04-27 DIAGNOSIS — M85852 Other specified disorders of bone density and structure, left thigh: Secondary | ICD-10-CM | POA: Diagnosis not present

## 2017-04-27 DIAGNOSIS — E2839 Other primary ovarian failure: Secondary | ICD-10-CM

## 2017-05-01 ENCOUNTER — Other Ambulatory Visit: Payer: Self-pay | Admitting: Cardiology

## 2017-05-01 DIAGNOSIS — I6523 Occlusion and stenosis of bilateral carotid arteries: Secondary | ICD-10-CM

## 2017-05-02 ENCOUNTER — Ambulatory Visit (HOSPITAL_COMMUNITY)
Admission: RE | Admit: 2017-05-02 | Discharge: 2017-05-02 | Disposition: A | Discharge status: Home / Self Care | Payer: Medicare Other | Source: Ambulatory Visit | Attending: Internal Medicine | Admitting: Internal Medicine

## 2017-05-02 ENCOUNTER — Telehealth: Payer: Self-pay | Admitting: *Deleted

## 2017-05-02 DIAGNOSIS — I6523 Occlusion and stenosis of bilateral carotid arteries: Secondary | ICD-10-CM | POA: Diagnosis not present

## 2017-05-02 DIAGNOSIS — Z87891 Personal history of nicotine dependence: Secondary | ICD-10-CM | POA: Insufficient documentation

## 2017-05-02 DIAGNOSIS — I10 Essential (primary) hypertension: Secondary | ICD-10-CM | POA: Insufficient documentation

## 2017-05-02 DIAGNOSIS — I2510 Atherosclerotic heart disease of native coronary artery without angina pectoris: Secondary | ICD-10-CM | POA: Insufficient documentation

## 2017-05-02 DIAGNOSIS — E785 Hyperlipidemia, unspecified: Secondary | ICD-10-CM | POA: Insufficient documentation

## 2017-05-02 NOTE — Telephone Encounter (Signed)
-----   Message from Dorothy Spark, MD sent at 05/02/2017 11:40 AM EST ----- R ICA 40-59% stenosis, L ICA < 1-39% stenosis Follow up scan in 12 months

## 2017-05-02 NOTE — Telephone Encounter (Signed)
Notified the pt of her carotid artery results and plan per Dr Meda Coffee, for this to be repeated in 1 year.  Informed the pt that I will place the order in the system, and have a [facility] scheduler call her back to arrange this for one year out.  Pt verbalized understanding and agrees with this plan.

## 2017-05-05 ENCOUNTER — Other Ambulatory Visit: Payer: Self-pay | Admitting: Family Medicine

## 2017-05-05 ENCOUNTER — Other Ambulatory Visit: Payer: Self-pay | Admitting: Cardiology

## 2017-05-05 DIAGNOSIS — I2510 Atherosclerotic heart disease of native coronary artery without angina pectoris: Secondary | ICD-10-CM

## 2017-05-05 DIAGNOSIS — R9431 Abnormal electrocardiogram [ECG] [EKG]: Secondary | ICD-10-CM

## 2017-05-05 DIAGNOSIS — M791 Myalgia, unspecified site: Secondary | ICD-10-CM

## 2017-05-05 DIAGNOSIS — I10 Essential (primary) hypertension: Secondary | ICD-10-CM

## 2017-05-07 ENCOUNTER — Other Ambulatory Visit: Payer: Self-pay | Admitting: Family Medicine

## 2017-05-08 ENCOUNTER — Other Ambulatory Visit: Payer: Self-pay | Admitting: Cardiology

## 2017-05-08 DIAGNOSIS — M791 Myalgia, unspecified site: Secondary | ICD-10-CM

## 2017-05-08 DIAGNOSIS — I2510 Atherosclerotic heart disease of native coronary artery without angina pectoris: Secondary | ICD-10-CM

## 2017-05-08 DIAGNOSIS — I10 Essential (primary) hypertension: Secondary | ICD-10-CM

## 2017-05-08 DIAGNOSIS — R9431 Abnormal electrocardiogram [ECG] [EKG]: Secondary | ICD-10-CM

## 2017-05-08 MED ORDER — ROSUVASTATIN CALCIUM 5 MG PO TABS: ORAL_TABLET | ORAL | 0 refills | Status: DC

## 2017-05-08 NOTE — Telephone Encounter (Signed)
°*  STAT* If patient is at the pharmacy, call can be transferred to refill team.   1. Which medications need to be refilled? (please list name of each medication and dose if known) Rosuvastatin 5 mg 1 qd  2. Which pharmacy/location (including street and city if local pharmacy) is medication to be sent to?[facility]  3. Do they need a 30 day or 90 day supply? Until appt 3-27

## 2017-05-08 NOTE — Telephone Encounter (Signed)
Pt's medication was sent to pt's pharmacy as requested. Confirmation received.  °

## 2017-05-23 NOTE — Progress Notes (Signed)
Cardiology Office Note    Date:  05/24/2017   ID:  Ann Wagner, DOB 1945-09-06, MRN [id number]  PCP:  Midge Minium, MD  Cardiologist: Ena Dawley, MD  No chief complaint on file.   History of Present Illness:  Ann Wagner is a 72 y.o. female who has a history of hypertension, HLD, vasovagal syncope, abnormal GXT which led to cardiac CTA showing a calcium score of 580 with diffuse proximal to mid LAD lesion.  She underwent DES to the mid LAD 01/12/15 with 20% proximal RCA LVEF 65%.  Last saw Dr. Meda Coffee 02/2016 at which time she was doing well.  Plavix was stopped and she was continued on aspirin 81 mg daily.  2D echo 05/2016 because of an abnormal EKG showed normal LVEF 65-70% with dynamic obstruction at rest in the mid cavity with a peak velocity of 200 cm/s and a peak gradient of 16 mmHg.  Patient comes in for yearly f/u. Walking 45 min 3 x/week going up hills without difficulty. No chest pain, palpitations, dyspnea, dyspnea on exertion, dizziness or presyncope.  Occasionally has twinges in her breast and some soreness in her back.  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  Labs done in February by primary care all look good except LDL is not at goal at 82.   Past Medical History:  Diagnosis Date  . Allergic rhinitis   . Arthritis   . Coronary artery disease    cath 01/12/2015 single vessel CAD, DES to mid LAD  . GERD (gastroesophageal reflux disease)   . Headache   . History of chicken pox   . Hyperlipidemia   . Hypertension   . Hyperthyroidism   . Syncope     Past Surgical History:  Procedure Laterality Date  . CARDIAC CATHETERIZATION N/A 01/12/2015   Procedure: Left Heart Cath and Coronary Angiography;  Surgeon: Peter M Martinique, MD;  Location: [facility];  Service: Cardiovascular;  Laterality: N/A;  . CARDIAC CATHETERIZATION Right 01/12/2015   Procedure: Coronary Stent Intervention;  Surgeon: Peter M Martinique, MD;  Location: [facility];  Service: Cardiovascular;  Laterality: Right;  . CORONARY STENT PLACEMENT  01/12/2015   des to [place]  . TUBAL LIGATION      Current Medications: No outpatient medications have been marked as taking for the 05/24/17 encounter (Appointment) with Imogene Burn, PA-C.     Allergies:   Propoxyphene n-acetaminophen   Social History   Socioeconomic History  . Marital status: Divorced    Spouse name: Not on file  . Number of children: Not on file  . Years of education: Not on file  . Highest education level: Not on file  Occupational History  . Not on file  Social Needs  . Financial resource strain: Not on file  . Food insecurity:    Worry: Not on file    Inability: Not on file  . Transportation needs:    Medical: Not on file    Non-medical: Not on file  Tobacco Use  . Smoking status: Former Research scientist (life sciences)  . Smokeless tobacco: Never Used  Substance and Sexual Activity  . Alcohol use: Yes    Comment: socially  . Drug use: No  . Sexual activity: Not on file  Lifestyle  . Physical activity:    Days per week: Not on file    Minutes per session: Not on file  . Stress: Not on file  Relationships  .  Social connections:    Talks on phone: Not on file    Gets together: Not on file    Attends religious service: Not on file    Active member of club or organization: Not on file    Attends meetings of clubs or organizations: Not on file    Relationship status: Not on file  Other Topics Concern  . Not on file  Social History Narrative   Lives alone.   2 sons - 1 locally with 5 kids.   Retired Nurse, learning disability, enjoys antique shows.     Family History:  The patient's family history includes Coronary artery disease in her father, mother, and paternal grandmother; Diabetes in her mother; Heart attack in her mother; Hypertension in her father, mother, and paternal grandmother.   ROS:   Please see the history of present illness.    Review of Systems  Constitution:  Negative.  HENT: Negative.   Eyes: Negative.   Cardiovascular: Negative.   Respiratory: Negative.   Hematologic/Lymphatic: Negative.   Musculoskeletal: Negative.  Negative for joint pain.  Gastrointestinal: Negative.   Genitourinary: Negative.   Neurological: Negative.    All other systems reviewed and are negative.   PHYSICAL EXAM:   VS:  There were no vitals taken for this visit.  Physical Exam  GEN: Well nourished, well developed, in no acute distress  HEENT: normal  Neck: no JVD, carotid bruits, or masses Cardiac:RRR; no murmurs, rubs, or gallops  Respiratory:  clear to auscultation bilaterally, normal work of breathing GI: soft, nontender, nondistended, + BS Ext: without cyanosis, clubbing, or edema, Good distal pulses bilaterally MS: no deformity or atrophy  Skin: warm and dry, no rash Neuro:  Alert and Oriented x 3, Strength and sensation are intact Psych: euthymic mood, full affect  Wt Readings from Last 3 Encounters:  04/05/17 181 lb 9.6 oz (82.4 kg)  04/05/17 181 lb 6 oz (82.3 kg)  12/12/16 182 lb (82.6 kg)      Studies/Labs Reviewed:   EKG:  EKG is ordered today.  The ekg ordered today demonstrates sinus bradycardia at 54 bpm, no acute change  Recent Labs: 04/05/2017: ALT 20; BUN 25; Creatinine, Ser 0.86; Hemoglobin 14.0; Platelets 303.0; Potassium 4.1; Sodium 140; TSH 2.90   Lipid Panel    Component Value Date/Time   CHOL 155 04/05/2017 1348   CHOL 180 05/11/2016 0845   TRIG 140.0 04/05/2017 1348   HDL 44.50 04/05/2017 1348   HDL 44 05/11/2016 0845   CHOLHDL 3 04/05/2017 1348   VLDL 28.0 04/05/2017 1348   LDLCALC 82 04/05/2017 1348   [place] 95 05/11/2016 0845   LDLDIRECT 92.9 12/20/2011 0924    Additional studies/ records that were reviewed today include:  2D echo 05/30/16  Study Conclusions   - Left ventricle: The cavity size was normal. Wall thickness was   normal. Systolic function was vigorous. The estimated ejection   fraction was in the  range of 65% to 70%. There was dynamic   obstruction at restin the mid cavity, with a peak velocity of 200   cm/sec and a peak gradient of 16 mm Hg. Wall motion was normal;   there were no regional wall motion abnormalities. Left   ventricular diastolic function parameters were normal. - Aortic valve: There was trivial regurgitation.   Cardiac cath 11/2016Conclusion    Prox RCA lesion, 20% stenosed.  The left ventricular systolic function is normal.  Prox LAD to Mid LAD lesion, 90% stenosed. Post intervention, there is  a 0% residual stenosis.   1. Single vessel obstructive CAD 2. Normal LV function 3. Successful stenting of the mid LAD with DES.   Plan: DAPT for one year. Anticipate DC in am.        ASSESSMENT:    1. Coronary artery disease involving native coronary artery of native heart without angina pectoris   2. Essential hypertension   3. Bilateral carotid artery stenosis   4. Mixed hyperlipidemia    Carotid Dopplers 05/02/17 R ICA 40-59% stenosis, L ICA < 1-39% stenosis Follow up scan in 12 months   PLAN:  In order of problems listed above:  CAD status post stenting in the mid LAD 2016, normal LVEF on echo 05/2016.  Patient doing well without angina.  Follow-up with Dr. Meda Coffee in 1 year.  Essential hypertension blood pressure well controlled.  Bilateral carotid artery stenosis R ICA 58-83% stenosis, LICA 2-54% stenosed Dopplers 05/02/17   Mixed hyperlipidemia LDL 82 on low-dose Crestor.  Increase Crestor to 10 mg daily.  Repeat lipids and liver in 4-6 months.    Medication Adjustments/Labs and Tests Ordered: Current medicines are reviewed at length with the patient today.  Concerns regarding medicines are outlined above.  Medication changes, Labs and Tests ordered today are listed in the Patient Instructions below. There are no Patient Instructions on file for this visit.   Sumner Boast, PA-C  05/24/2017 8:50 AM    [facility]  [facility] [address], [place], [place]  [address] Phone: [phone]; Fax: [phone]

## 2017-05-24 ENCOUNTER — Ambulatory Visit: Payer: Medicare Other | Admitting: Physician Assistant

## 2017-05-24 ENCOUNTER — Encounter: Payer: Self-pay | Admitting: Physician Assistant

## 2017-05-24 VITALS — BP 140/84 | HR 53 | Ht 65.5 in | Wt 179.0 lb

## 2017-05-24 DIAGNOSIS — E782 Mixed hyperlipidemia: Secondary | ICD-10-CM

## 2017-05-24 DIAGNOSIS — I10 Essential (primary) hypertension: Secondary | ICD-10-CM | POA: Diagnosis not present

## 2017-05-24 DIAGNOSIS — I6523 Occlusion and stenosis of bilateral carotid arteries: Secondary | ICD-10-CM

## 2017-05-24 DIAGNOSIS — I2510 Atherosclerotic heart disease of native coronary artery without angina pectoris: Secondary | ICD-10-CM | POA: Diagnosis not present

## 2017-05-24 MED ORDER — ROSUVASTATIN CALCIUM 10 MG PO TABS
10.0000 mg | ORAL_TABLET | Freq: Every day | ORAL | 2 refills | Status: DC
Start: 2017-05-24 — End: 2017-06-05

## 2017-05-24 NOTE — Patient Instructions (Signed)
Medication Instructions:   INCREASE YOUR ROSUVASTATIN TO 10 MG ONCE DAILY     Labwork:  4 MONTHS TO CHECK---LFTs AND LIPIDS---PLEASE COME FASTING TO THIS LAB APPOINTMENT     Follow-Up:  Your physician wants you to follow-up in: [place] will receive a reminder letter in the mail two months in advance. If you don't receive a letter, please call our office to schedule the follow-up appointment.        If you need a refill on your cardiac medications before your next appointment, please call your pharmacy.

## 2017-06-03 ENCOUNTER — Other Ambulatory Visit: Payer: Self-pay | Admitting: Cardiology

## 2017-06-03 DIAGNOSIS — I2510 Atherosclerotic heart disease of native coronary artery without angina pectoris: Secondary | ICD-10-CM

## 2017-06-03 DIAGNOSIS — I10 Essential (primary) hypertension: Secondary | ICD-10-CM

## 2017-06-03 DIAGNOSIS — R9431 Abnormal electrocardiogram [ECG] [EKG]: Secondary | ICD-10-CM

## 2017-06-03 DIAGNOSIS — M791 Myalgia, unspecified site: Secondary | ICD-10-CM

## 2017-06-05 ENCOUNTER — Other Ambulatory Visit: Payer: Self-pay | Admitting: Physician Assistant

## 2017-06-05 DIAGNOSIS — I10 Essential (primary) hypertension: Secondary | ICD-10-CM

## 2017-06-05 DIAGNOSIS — E782 Mixed hyperlipidemia: Secondary | ICD-10-CM

## 2017-06-05 DIAGNOSIS — I6523 Occlusion and stenosis of bilateral carotid arteries: Secondary | ICD-10-CM

## 2017-06-05 DIAGNOSIS — I2510 Atherosclerotic heart disease of native coronary artery without angina pectoris: Secondary | ICD-10-CM

## 2017-06-05 MED ORDER — ROSUVASTATIN CALCIUM 10 MG PO TABS
10.0000 mg | ORAL_TABLET | Freq: Every day | ORAL | 3 refills | Status: DC
Start: 2017-06-05 — End: 2018-08-15

## 2017-06-14 ENCOUNTER — Encounter: Payer: Self-pay | Admitting: Physician Assistant

## 2017-06-14 ENCOUNTER — Other Ambulatory Visit: Payer: Self-pay | Admitting: Physician Assistant

## 2017-06-14 ENCOUNTER — Other Ambulatory Visit: Payer: Self-pay

## 2017-06-14 ENCOUNTER — Ambulatory Visit (INDEPENDENT_AMBULATORY_CARE_PROVIDER_SITE_OTHER): Payer: Medicare Other | Admitting: Physician Assistant

## 2017-06-14 VITALS — BP 128/70 | HR 48 | Temp 98.3°F | Resp 16 | Ht 66.0 in | Wt 179.0 lb

## 2017-06-14 DIAGNOSIS — J208 Acute bronchitis due to other specified organisms: Secondary | ICD-10-CM

## 2017-06-14 MED ORDER — FLUTICASONE PROPIONATE 50 MCG/ACT NA SUSP: 2.0000 | Freq: Every day | NASAL | 0 refills | Status: DC

## 2017-06-14 MED ORDER — DOXYCYCLINE HYCLATE 100 MG PO CAPS: 100.0000 mg | ORAL_CAPSULE | Freq: Two times a day (BID) | ORAL | 0 refills | Status: DC

## 2017-06-14 MED ORDER — BENZONATATE 100 MG PO CAPS: 100.0000 mg | ORAL_CAPSULE | Freq: Three times a day (TID) | ORAL | 0 refills | Status: DC | PRN

## 2017-06-14 NOTE — Progress Notes (Signed)
Patient presents to clinic today c/o 3 days of cough and congestion with sputum production. Has noted a lot of PND and some occasional chest tightness. Denies chest pain or SOB. Denies fever, chills, aches, sinus pain, ear pain or facial pain. Has taken Ibuprofen and Mucinex with some relief in symptoms.    Past Medical History:  Diagnosis Date  . Allergic rhinitis   . Arthritis   . Coronary artery disease    cath 01/12/2015 single vessel CAD, DES to mid LAD  . GERD (gastroesophageal reflux disease)   . Headache   . History of chicken pox   . Hyperlipidemia   . Hypertension   . Hyperthyroidism   . Syncope     Current Outpatient Medications on File Prior to Visit  Medication Sig Dispense Refill  . albuterol (PROVENTIL HFA;VENTOLIN HFA) 108 (90 Base) MCG/ACT inhaler Inhale 2 puffs into the lungs every 6 (six) hours as needed for wheezing or shortness of breath. 1 Inhaler 0  . amLODipine (NORVASC) 10 MG tablet TAKE 1 TABLET(10 MG) BY MOUTH DAILY 90 tablet 0  . aspirin EC 81 MG tablet Take 1 tablet (81 mg total) by mouth daily.    . fenofibrate 160 MG tablet TAKE 1 TABLET(160 MG) BY MOUTH DAILY 90 tablet 0  . hydrochlorothiazide (HYDRODIURIL) 25 MG tablet TAKE 1 TABLET(25 MG) BY MOUTH DAILY 90 tablet 0  . levothyroxine (SYNTHROID, LEVOTHROID) 75 MCG tablet TAKE 1 TABLET BY MOUTH EVERY DAY 30 tablet 6  . lisinopril (PRINIVIL,ZESTRIL) 40 MG tablet TAKE 1 TABLET BY MOUTH EVERY DAY 30 tablet 6  . nitroGLYCERIN (NITROSTAT) 0.4 MG SL tablet PLACE 1 TABLET UNDER TONGUE EVERY 5 MINUTES AS NEEDED FOR CHEST PAIN 60 tablet 1  . pantoprazole (PROTONIX) 40 MG tablet TAKE 1 TABLET BY MOUTH DAILY AT NOON 90 tablet 0  . PARoxetine (PAXIL) 10 MG tablet TAKE 1 TABLET(10 MG) BY MOUTH DAILY 90 tablet 0  . rosuvastatin (CRESTOR) 10 MG tablet Take 1 tablet (10 mg total) by mouth daily. 90 tablet 3  . Vitamin D, Ergocalciferol, (DRISDOL) 50000 units CAPS capsule Take 1 capsule (50,000 Units total) by mouth  every 7 (seven) days. 12 capsule 0   No current facility-administered medications on file prior to visit.     Allergies  Allergen Reactions  . Propoxyphene N-Acetaminophen Other (See Comments)    Made sick    Family History  Problem Relation Age of Onset  . Coronary artery disease Mother   . Hypertension Mother   . Heart attack Mother   . Diabetes Mother   . Coronary artery disease Father   . Hypertension Father   . Coronary artery disease Paternal Grandmother   . Hypertension Paternal Grandmother   . Stroke Neg Hx   . Cancer Neg Hx     Social History   Socioeconomic History  . Marital status: Divorced    Spouse name: Not on file  . Number of children: Not on file  . Years of education: Not on file  . Highest education level: Not on file  Occupational History  . Not on file  Social Needs  . Financial resource strain: Not on file  . Food insecurity:    Worry: Not on file    Inability: Not on file  . Transportation needs:    Medical: Not on file    Non-medical: Not on file  Tobacco Use  . Smoking status: Former Research scientist (life sciences)  . Smokeless tobacco: Never Used  Substance and Sexual  Activity  . Alcohol use: Yes    Comment: socially  . Drug use: No  . Sexual activity: Not on file  Lifestyle  . Physical activity:    Days per week: Not on file    Minutes per session: Not on file  . Stress: Not on file  Relationships  . Social connections:    Talks on phone: Not on file    Gets together: Not on file    Attends religious service: Not on file    Active member of club or organization: Not on file    Attends meetings of clubs or organizations: Not on file    Relationship status: Not on file  Other Topics Concern  . Not on file  Social History Narrative   Lives alone.   2 sons - 1 locally with 5 kids.   Retired Nurse, learning disability, enjoys antique shows.   Review of Systems - See HPI.  All other ROS are negative.  BP 128/70   Pulse (!) 48   Temp 98.3 F (36.8 C)  (Oral)   Resp 16   Ht 5\' 6"  (1.676 m)   Wt 179 lb (81.2 kg)   SpO2 98%   BMI 28.89 kg/m   Physical Exam  Constitutional: She is oriented to person, place, and time. She appears well-developed and well-nourished. No distress.  HENT:  Head: Normocephalic and atraumatic.  Neck: Neck supple.  Cardiovascular: Normal rate, regular rhythm and normal heart sounds.  Pulmonary/Chest: Effort normal and breath sounds normal. No stridor. No respiratory distress. She has no wheezes. She has no rales. She exhibits no tenderness.  Lymphadenopathy:    She has no cervical adenopathy.  Neurological: She is alert and oriented to person, place, and time.  Skin: Skin is warm. She is not diaphoretic.    Recent Results (from the past 2160 hour(s))  Lipid panel     Status: None   Collection Time: 04/05/17  1:48 PM  Result Value Ref Range   Cholesterol 155 0 - 200 mg/dL    Comment: ATP III Classification       Desirable:  < 200 mg/dL               Borderline High:  200 - 239 mg/dL          High:  > = 240 mg/dL   Triglycerides 140.0 0.0 - 149.0 mg/dL    Comment: Normal:  <150 mg/dLBorderline High:  150 - 199 mg/dL   HDL 44.50 >39.00 mg/dL   VLDL 28.0 0.0 - 40.0 mg/dL   LDL Cholesterol 82 0 - 99 mg/dL   Total CHOL/HDL Ratio 3     Comment:                Men          Women1/2 Average Risk     3.4          3.3Average Risk          5.0          4.42X Average Risk          9.6          7.13X Average Risk          15.0          11.0                       NonHDL 110.33     Comment: NOTE:  Non-HDL goal should  be 30 mg/dL higher than patient's LDL goal (i.e. LDL goal of < 70 mg/dL, would have non-HDL goal of < 100 mg/dL)  Basic metabolic panel     Status: Abnormal   Collection Time: 04/05/17  1:48 PM  Result Value Ref Range   Sodium 140 135 - 145 mEq/L   Potassium 4.1 3.5 - 5.1 mEq/L   Chloride 104 96 - 112 mEq/L   CO2 31 19 - 32 mEq/L   Glucose, Bld 96 70 - 99 mg/dL   BUN 25 (H) 6 - 23 mg/dL   Creatinine,  Ser 0.86 0.40 - 1.20 mg/dL   Calcium 9.7 8.4 - 10.5 mg/dL   GFR 68.87 >60.00 mL/min  TSH     Status: None   Collection Time: 04/05/17  1:48 PM  Result Value Ref Range   TSH 2.90 0.35 - 4.50 uIU/mL  Hepatic function panel     Status: None   Collection Time: 04/05/17  1:48 PM  Result Value Ref Range   Total Bilirubin 0.4 0.2 - 1.2 mg/dL   Bilirubin, Direct 0.1 0.0 - 0.3 mg/dL   Alkaline Phosphatase 53 39 - 117 U/L   AST 20 0 - 37 U/L   ALT 20 0 - 35 U/L   Total Protein 7.1 6.0 - 8.3 g/dL   Albumin 4.6 3.5 - 5.2 g/dL  CBC with Differential/Platelet     Status: None   Collection Time: 04/05/17  1:48 PM  Result Value Ref Range   WBC 7.5 4.0 - 10.5 K/uL   RBC 4.62 3.87 - 5.11 Mil/uL   Hemoglobin 14.0 12.0 - 15.0 g/dL   HCT 41.5 36.0 - 46.0 %   MCV 89.9 78.0 - 100.0 fl   MCHC 33.6 30.0 - 36.0 g/dL   RDW 13.6 11.5 - 15.5 %   Platelets 303.0 150.0 - 400.0 K/uL   Neutrophils Relative % 50.5 43.0 - 77.0 %   Lymphocytes Relative 36.0 12.0 - 46.0 %   Monocytes Relative 10.2 3.0 - 12.0 %   Eosinophils Relative 2.6 0.0 - 5.0 %   Basophils Relative 0.7 0.0 - 3.0 %   Neutro Abs 3.8 1.4 - 7.7 K/uL   Lymphs Abs 2.7 0.7 - 4.0 K/uL   Monocytes Absolute 0.8 0.1 - 1.0 K/uL   Eosinophils Absolute 0.2 0.0 - 0.7 K/uL   Basophils Absolute 0.1 0.0 - 0.1 K/uL  VITAMIN D 25 Hydroxy (Vit-D Deficiency, Fractures)     Status: Abnormal   Collection Time: 04/05/17  1:48 PM  Result Value Ref Range   VITD 12.34 (L) 30.00 - 100.00 ng/mL    Assessment/Plan: 1. Viral bronchitis Start OTC medications and supportive measures reviewed. Rx Tessalon for cough. Did print Rx for Doxycycline to take if symptoms are worsening over the weekend.   - benzonatate (TESSALON) 100 MG capsule; Take 1 capsule (100 mg total) by mouth 3 (three) times daily as needed for cough.  Dispense: 20 capsule; Refill: 0   Leeanne Rio, PA-C

## 2017-06-14 NOTE — Patient Instructions (Signed)
Increase fluids.  Get plenty of rest. Use Mucinex for congestion. Use the Tessalon for cough and Flonase for the nasal inflammation and post-nasal drip. Take a daily probiotic (I recommend Align or Culturelle, but even Activia Yogurt may be beneficial).  A humidifier placed in the bedroom may offer some relief for a dry, scratchy throat of nasal irritation.  Read information below on acute bronchitis.  If symptoms are not leveling off/improving in 48 hours, fill the antibiotic and take as directed. Please call or return to clinic if symptoms are not improving.  Acute Bronchitis Bronchitis is when the airways that extend from the windpipe into the lungs get red, puffy, and painful (inflamed). Bronchitis often causes thick spit (mucus) to develop. This leads to a cough. A cough is the most common symptom of bronchitis. In acute bronchitis, the condition usually begins suddenly and goes away over time (usually in 2 weeks). Smoking, allergies, and asthma can make bronchitis worse. Repeated episodes of bronchitis may cause more lung problems.  HOME CARE  Rest.  Drink enough fluids to keep your pee (urine) clear or pale yellow (unless you need to limit fluids as told by your doctor).  Only take over-the-counter or prescription medicines as told by your doctor.  Avoid smoking and secondhand smoke. These can make bronchitis worse. If you are a smoker, think about using nicotine gum or skin patches. Quitting smoking will help your lungs heal faster.  Reduce the chance of getting bronchitis again by:  Washing your hands often.  Avoiding people with cold symptoms.  Trying not to touch your hands to your mouth, nose, or eyes.  Follow up with your doctor as told.  GET HELP IF: Your symptoms do not improve after 1 week of treatment. Symptoms include:  Cough.  Fever.  Coughing up thick spit.  Body aches.  Chest congestion.  Chills.  Shortness of breath.  Sore throat.  GET HELP RIGHT  AWAY IF:   You have an increased fever.  You have chills.  You have severe shortness of breath.  You have bloody thick spit (sputum).  You throw up (vomit) often.  You lose too much body fluid (dehydration).  You have a severe headache.  You faint.  MAKE SURE YOU:   Understand these instructions.  Will watch your condition.  Will get help right away if you are not doing well or get worse. Document Released: 08/03/2007 Document Revised: 10/17/2012 Document Reviewed: 08/07/2012 [facility] Patient Information 2015 [place], [place]. This information is not intended to replace advice given to you by your health care provider. Make sure you discuss any questions you have with your health care provider.

## 2017-06-14 NOTE — Telephone Encounter (Signed)
Patient was seen today and is requesting a 90 day Rx for the Flonase.   Please advise.

## 2017-06-15 ENCOUNTER — Other Ambulatory Visit: Payer: Self-pay | Admitting: Cardiology

## 2017-07-04 ENCOUNTER — Other Ambulatory Visit: Payer: Self-pay | Admitting: Family Medicine

## 2017-07-11 DIAGNOSIS — D485 Neoplasm of uncertain behavior of skin: Secondary | ICD-10-CM | POA: Diagnosis not present

## 2017-07-11 DIAGNOSIS — L989 Disorder of the skin and subcutaneous tissue, unspecified: Secondary | ICD-10-CM | POA: Diagnosis not present

## 2017-07-21 ENCOUNTER — Other Ambulatory Visit: Payer: Self-pay | Admitting: Family Medicine

## 2017-08-01 ENCOUNTER — Other Ambulatory Visit: Payer: Medicare Other | Admitting: *Deleted

## 2017-08-01 DIAGNOSIS — I6523 Occlusion and stenosis of bilateral carotid arteries: Secondary | ICD-10-CM | POA: Diagnosis not present

## 2017-08-01 DIAGNOSIS — I2510 Atherosclerotic heart disease of native coronary artery without angina pectoris: Secondary | ICD-10-CM

## 2017-08-01 DIAGNOSIS — I10 Essential (primary) hypertension: Secondary | ICD-10-CM

## 2017-08-01 DIAGNOSIS — E782 Mixed hyperlipidemia: Secondary | ICD-10-CM

## 2017-08-01 LAB — LIPID PANEL
CHOLESTEROL TOTAL: 139 mg/dL (ref 100–199)
Chol/HDL Ratio: 2.8 ratio (ref 0.0–4.4)
HDL: 49 mg/dL (ref >39)
LDL Calculated: 75 mg/dL (ref 0–99)
TRIGLYCERIDES: 73 mg/dL (ref 0–149)
VLDL Cholesterol Cal: 15 mg/dL (ref 5–40)

## 2017-08-01 LAB — HEPATIC FUNCTION PANEL
ALK PHOS: 60 IU/L (ref 39–117)
ALT: 20 IU/L (ref 0–32)
AST: 19 IU/L (ref 0–40)
Albumin: 4.4 g/dL (ref 3.5–4.8)
BILIRUBIN, DIRECT: 0.08 mg/dL (ref 0.00–0.40)
Bilirubin Total: 0.2 mg/dL (ref 0.0–1.2)
TOTAL PROTEIN: 6.6 g/dL (ref 6.0–8.5)

## 2017-08-08 ENCOUNTER — Other Ambulatory Visit: Payer: Self-pay | Admitting: Family Medicine

## 2017-09-11 ENCOUNTER — Other Ambulatory Visit: Payer: Self-pay | Admitting: Cardiology

## 2017-10-08 ENCOUNTER — Other Ambulatory Visit: Payer: Self-pay | Admitting: Family Medicine

## 2017-10-17 NOTE — Addendum Note (Signed)
Addended by: Leonidas Romberg on: 10/17/2017 03:33 PM   Modules accepted: Orders

## 2017-10-17 NOTE — Telephone Encounter (Signed)
Patient called and made an appointment for 9/23, she is traveling until then and wanted to know if Dr Birdie Riddle could refill her medication until then. She will contact pharmacy for the others.  amLODipine (NORVASC) 10 MG tablet  [id number] - [place], [place] - [address] AT [place]  [place] [id number]

## 2017-10-19 ENCOUNTER — Other Ambulatory Visit: Payer: Self-pay | Admitting: Family Medicine

## 2017-10-22 ENCOUNTER — Other Ambulatory Visit: Payer: Self-pay | Admitting: Family Medicine

## 2017-11-12 ENCOUNTER — Other Ambulatory Visit: Payer: Self-pay | Admitting: Family Medicine

## 2017-11-16 ENCOUNTER — Other Ambulatory Visit: Payer: Self-pay | Admitting: Family Medicine

## 2017-11-20 ENCOUNTER — Ambulatory Visit (INDEPENDENT_AMBULATORY_CARE_PROVIDER_SITE_OTHER): Payer: Medicare Other | Admitting: Family Medicine

## 2017-11-20 ENCOUNTER — Other Ambulatory Visit: Payer: Self-pay

## 2017-11-20 ENCOUNTER — Encounter: Payer: Self-pay | Admitting: Family Medicine

## 2017-11-20 VITALS — BP 138/78 | HR 51 | Temp 98.1°F | Resp 16 | Ht 66.0 in | Wt 175.5 lb

## 2017-11-20 DIAGNOSIS — E782 Mixed hyperlipidemia: Secondary | ICD-10-CM | POA: Diagnosis not present

## 2017-11-20 DIAGNOSIS — I10 Essential (primary) hypertension: Secondary | ICD-10-CM | POA: Diagnosis not present

## 2017-11-20 DIAGNOSIS — E038 Other specified hypothyroidism: Secondary | ICD-10-CM | POA: Diagnosis not present

## 2017-11-20 DIAGNOSIS — Z23 Encounter for immunization: Secondary | ICD-10-CM | POA: Diagnosis not present

## 2017-11-20 DIAGNOSIS — I6523 Occlusion and stenosis of bilateral carotid arteries: Secondary | ICD-10-CM

## 2017-11-20 DIAGNOSIS — R350 Frequency of micturition: Secondary | ICD-10-CM

## 2017-11-20 LAB — CBC WITH DIFFERENTIAL/PLATELET
Basophils Absolute: 0 10*3/uL (ref 0.0–0.1)
Basophils Relative: 0.6 % (ref 0.0–3.0)
EOS ABS: 0.5 10*3/uL (ref 0.0–0.7)
Eosinophils Relative: 6.1 % — ABNORMAL HIGH (ref 0.0–5.0)
HCT: 41.2 % (ref 36.0–46.0)
Hemoglobin: 13.9 g/dL (ref 12.0–15.0)
LYMPHS ABS: 2.3 10*3/uL (ref 0.7–4.0)
Lymphocytes Relative: 28.9 % (ref 12.0–46.0)
MCHC: 33.8 g/dL (ref 30.0–36.0)
MCV: 88.5 fl (ref 78.0–100.0)
Monocytes Absolute: 0.7 10*3/uL (ref 0.1–1.0)
Monocytes Relative: 9 % (ref 3.0–12.0)
NEUTROS ABS: 4.4 10*3/uL (ref 1.4–7.7)
NEUTROS PCT: 55.4 % (ref 43.0–77.0)
PLATELETS: 280 10*3/uL (ref 150.0–400.0)
RBC: 4.65 Mil/uL (ref 3.87–5.11)
RDW: 13.8 % (ref 11.5–15.5)
WBC: 8 10*3/uL (ref 4.0–10.5)

## 2017-11-20 LAB — BASIC METABOLIC PANEL
BUN: 28 mg/dL — ABNORMAL HIGH (ref 6–23)
CALCIUM: 10.2 mg/dL (ref 8.4–10.5)
CO2: 30 meq/L (ref 19–32)
CREATININE: 0.99 mg/dL (ref 0.40–1.20)
Chloride: 102 mEq/L (ref 96–112)
GFR: 58.44 mL/min — AB (ref >60.00)
GLUCOSE: 116 mg/dL — AB (ref 70–99)
POTASSIUM: 4.2 meq/L (ref 3.5–5.1)
SODIUM: 139 meq/L (ref 135–145)

## 2017-11-20 LAB — HEPATIC FUNCTION PANEL
ALBUMIN: 4.6 g/dL (ref 3.5–5.2)
ALT: 40 U/L — AB (ref 0–35)
AST: 29 U/L (ref 0–37)
Alkaline Phosphatase: 51 U/L (ref 39–117)
Bilirubin, Direct: 0.1 mg/dL (ref 0.0–0.3)
TOTAL PROTEIN: 7.2 g/dL (ref 6.0–8.3)
Total Bilirubin: 0.4 mg/dL (ref 0.2–1.2)

## 2017-11-20 LAB — POCT URINALYSIS DIPSTICK
BILIRUBIN UA: NEGATIVE
Blood, UA: NEGATIVE
Glucose, UA: NEGATIVE
Ketones, UA: NEGATIVE
Leukocytes, UA: NEGATIVE
Nitrite, UA: NEGATIVE
PH UA: 5 (ref 5.0–8.0)
Protein, UA: NEGATIVE
SPEC GRAV UA: 1.025 (ref 1.010–1.025)
UROBILINOGEN UA: 0.2 U/dL

## 2017-11-20 LAB — LIPID PANEL
CHOL/HDL RATIO: 3
Cholesterol: 147 mg/dL (ref 0–200)
HDL: 48.9 mg/dL (ref >39.00)
LDL Cholesterol: 72 mg/dL (ref 0–99)
NONHDL: 98.28
TRIGLYCERIDES: 129 mg/dL (ref 0.0–149.0)
VLDL: 25.8 mg/dL (ref 0.0–40.0)

## 2017-11-20 LAB — TSH: TSH: 1.77 u[IU]/mL (ref 0.35–4.50)

## 2017-11-20 NOTE — Progress Notes (Signed)
   Subjective:    Patient ID: Ann Wagner, female    DOB: Dec 27, 1945, 72 y.o.   MRN: [id number]  HPI HTN- chronic problem, on Amlodipine 10mg  daily, HCTZ 25mg  daily, Lisinopril 40mg  daily w/ adequate control (she did not take meds today).  No CP, SOB, HAs, visual changes, edema.  Hyperlipidemia- chronic problem, on Crestor 10mg  daily w/ hx of good control.  Denies abd pain, N/V.  ? UTI- took leftover abx while on vacation 'that really helped'.  Continues to have some suprapubic discomfort.  Has sense of urgency.  No leakage.  Hypothyroid- chronic problem, on Levothyroxine 94mcg daily.  Denies changes to energy level, skin/hair/nails.   Review of Systems For ROS see HPI     Objective:   Physical Exam  Constitutional: She is oriented to person, place, and time. She appears well-developed and well-nourished. No distress.  HENT:  Head: Normocephalic and atraumatic.  Eyes: Pupils are equal, round, and reactive to light. Conjunctivae and EOM are normal.  Neck: Normal range of motion. Neck supple. No thyromegaly present.  Cardiovascular: Normal rate, regular rhythm, normal heart sounds and intact distal pulses.  No murmur heard. Pulmonary/Chest: Effort normal and breath sounds normal. No respiratory distress.  Abdominal: Soft. She exhibits no distension. There is no tenderness.  Musculoskeletal: She exhibits no edema.  Lymphadenopathy:    She has no cervical adenopathy.  Neurological: She is alert and oriented to person, place, and time.  Skin: Skin is warm and dry.  Psychiatric: She has a normal mood and affect. Her behavior is normal.  Vitals reviewed.         Assessment & Plan:  Urinary frequency- UA WNL here in office.  Discussed that this could be age related.  If sxs don't improve, could consider urology referral.  Pt expressed understanding and is in agreement w/ plan.

## 2017-11-20 NOTE — Assessment & Plan Note (Signed)
Following w/ Dr Nelson 

## 2017-11-20 NOTE — Assessment & Plan Note (Signed)
Chronic problem.  Adequate control.  Asymptomatic.  Check labs.  No anticipated med changes.  Will follow. 

## 2017-11-20 NOTE — Assessment & Plan Note (Signed)
Chronic problem.  Currently asymptomatic.  Check labs.  Adjust meds prn  

## 2017-11-20 NOTE — Assessment & Plan Note (Signed)
Chronic problem.  Tolerating statin w/o difficulty.  Check labs.  Adjust meds prn  

## 2017-11-20 NOTE — Patient Instructions (Addendum)
Schedule your complete physical in 6 months We'll notify you of your lab results and make any changes if needed Continue to work on healthy diet and regular exercise- you can do it! Call with any questions or concerns Happy Early Birthday!!!

## 2017-11-21 ENCOUNTER — Other Ambulatory Visit (INDEPENDENT_AMBULATORY_CARE_PROVIDER_SITE_OTHER): Payer: Medicare Other

## 2017-11-21 ENCOUNTER — Other Ambulatory Visit: Payer: Self-pay | Admitting: Family Medicine

## 2017-11-21 DIAGNOSIS — R7309 Other abnormal glucose: Secondary | ICD-10-CM

## 2017-11-21 DIAGNOSIS — R945 Abnormal results of liver function studies: Secondary | ICD-10-CM

## 2017-11-21 DIAGNOSIS — R7989 Other specified abnormal findings of blood chemistry: Secondary | ICD-10-CM

## 2017-11-21 LAB — HEMOGLOBIN A1C: HEMOGLOBIN A1C: 6.6 % — AB (ref 4.6–6.5)

## 2017-12-12 ENCOUNTER — Other Ambulatory Visit (INDEPENDENT_AMBULATORY_CARE_PROVIDER_SITE_OTHER): Payer: Medicare Other

## 2017-12-12 DIAGNOSIS — R945 Abnormal results of liver function studies: Secondary | ICD-10-CM | POA: Diagnosis not present

## 2017-12-12 DIAGNOSIS — R7989 Other specified abnormal findings of blood chemistry: Secondary | ICD-10-CM

## 2017-12-12 LAB — HEPATIC FUNCTION PANEL
ALK PHOS: 52 U/L (ref 39–117)
ALT: 25 U/L (ref 0–35)
AST: 23 U/L (ref 0–37)
Albumin: 4.4 g/dL (ref 3.5–5.2)
BILIRUBIN DIRECT: 0.1 mg/dL (ref 0.0–0.3)
TOTAL PROTEIN: 6.6 g/dL (ref 6.0–8.3)
Total Bilirubin: 0.4 mg/dL (ref 0.2–1.2)

## 2017-12-13 ENCOUNTER — Encounter: Payer: Self-pay | Admitting: General Practice

## 2017-12-19 ENCOUNTER — Other Ambulatory Visit: Payer: Self-pay | Admitting: Family Medicine

## 2018-01-14 ENCOUNTER — Other Ambulatory Visit: Payer: Self-pay | Admitting: Family Medicine

## 2018-01-20 ENCOUNTER — Other Ambulatory Visit: Payer: Self-pay | Admitting: Family Medicine

## 2018-01-24 ENCOUNTER — Other Ambulatory Visit: Payer: Self-pay | Admitting: Family Medicine

## 2018-02-15 ENCOUNTER — Other Ambulatory Visit: Payer: Self-pay | Admitting: Family Medicine

## 2018-04-17 ENCOUNTER — Other Ambulatory Visit: Payer: Self-pay | Admitting: Family Medicine

## 2018-04-17 NOTE — Progress Notes (Addendum)
Subjective:   Ann Wagner is a 73 y.o. female who presents for Medicare Annual (Subsequent) preventive examination.  Review of Systems:  No ROS.  Medicare Wellness Visit. Additional risk factors are reflected in the social history.  Cardiac Risk Factors include: advanced age (>16men, >60 women);dyslipidemia;hypertension;family history of premature cardiovascular disease   Sleep patterns: Sleeps 10 hours. Up to void x 1-2. Home Safety/Smoke Alarms: Feels safe in home. Smoke alarms in place.  Living environment; residence and Firearm Safety: Lives alone in [place].  Seat Belt Safety/Bike Helmet: Wears seat belt.   Female:   [id number]       Mammo-04/27/2017,BI-RADS CATEGORY  1: Negative. Ordered today.  Dexa scan-04/27/2017, normal.      CCS-Colonoscopy 08/25/2009; Moderate diverticulitis. Recall 08/2019     Objective:     Vitals: BP 138/60 (BP Location: Left Arm, Patient Position: Sitting, Cuff Size: Normal)   Pulse (!) 50   Ht 5\' 6"  (1.676 m)   Wt 175 lb 8 oz (79.6 kg)   SpO2 97%   BMI 28.33 kg/m   Body mass index is 28.33 kg/m.  Advanced Directives 04/18/2018 04/05/2017 12/16/2015 01/12/2015  Does Patient Have a Medical Advance Directive? Yes Yes Yes No;Yes  Type of Paramedic of [place];Living will Living will;Healthcare Power of [place]  Does patient want to make changes to medical advance directive? - - No - Patient declined No - Patient declined  Copy of [place] in Chart? No - copy requested No - copy requested No - copy requested No - copy requested    Tobacco Social History   Tobacco Use  Smoking Status Former Smoker  Smokeless Tobacco Never Used     Counseling given: Not Answered   Clinical Intake:     Pain : No/denies pain     Nutritional Status: BMI 25 -29 Overweight Nutritional Risks: None Diabetes: No           Past  Medical History:  Diagnosis Date  . Allergic rhinitis   . Arthritis   . Coronary artery disease    cath 01/12/2015 single vessel CAD, DES to mid LAD  . GERD (gastroesophageal reflux disease)   . Headache   . History of chicken pox   . Hyperlipidemia   . Hypertension   . Hyperthyroidism   . Syncope    Past Surgical History:  Procedure Laterality Date  . CARDIAC CATHETERIZATION N/A 01/12/2015   Procedure: Left Heart Cath and Coronary Angiography;  Surgeon: Peter M Martinique, MD;  Location: [facility];  Service: Cardiovascular;  Laterality: N/A;  . CARDIAC CATHETERIZATION Right 01/12/2015   Procedure: Coronary Stent Intervention;  Surgeon: Peter M Martinique, MD;  Location: [facility];  Service: Cardiovascular;  Laterality: Right;  . CORONARY STENT PLACEMENT  01/12/2015   des to [place]  . TUBAL LIGATION     Family History  Problem Relation Age of Onset  . Coronary artery disease Mother   . Hypertension Mother   . Heart attack Mother   . Diabetes Mother   . Coronary artery disease Father   . Hypertension Father   . Coronary artery disease Paternal Grandmother   . Hypertension Paternal Grandmother   . Heart disease Son   . Hypertension Son   . Stroke Neg Hx   . Cancer Neg Hx    Social History   Socioeconomic History  . Marital status: Divorced  Spouse name: Not on file  . Number of children: Not on file  . Years of education: Not on file  . Highest education level: Not on file  Occupational History  . Not on file  Social Needs  . Financial resource strain: Not on file  . Food insecurity:    Worry: Not on file    Inability: Not on file  . Transportation needs:    Medical: Not on file    Non-medical: Not on file  Tobacco Use  . Smoking status: Former Research scientist (life sciences)  . Smokeless tobacco: Never Used  Substance and Sexual Activity  . Alcohol use: Yes    Comment: socially  . Drug use: No  . Sexual activity: Not on file  Lifestyle  . Physical activity:    Days  per week: Not on file    Minutes per session: Not on file  . Stress: Not on file  Relationships  . Social connections:    Talks on phone: Not on file    Gets together: Not on file    Attends religious service: Not on file    Active member of club or organization: Not on file    Attends meetings of clubs or organizations: Not on file    Relationship status: Not on file  Other Topics Concern  . Not on file  Social History Narrative   Lives alone.   2 sons - 1 locally with 5 kids.   Retired Nurse, learning disability, enjoys antique shows.    Outpatient Encounter Medications as of 04/18/2018  Medication Sig  . albuterol (PROVENTIL HFA;VENTOLIN HFA) 108 (90 Base) MCG/ACT inhaler Inhale 2 puffs into the lungs every 6 (six) hours as needed for wheezing or shortness of breath.  Marland Kitchen amLODipine (NORVASC) 10 MG tablet TAKE 1 TABLET(10 MG) BY MOUTH DAILY  . aspirin EC 81 MG tablet Take 1 tablet (81 mg total) by mouth daily.  . fenofibrate 160 MG tablet TAKE 1 TABLET BY MOUTH DAILY  . fluticasone (FLONASE) 50 MCG/ACT nasal spray SHAKE LIQUID AND USE 2 SPRAYS IN EACH NOSTRIL DAILY  . hydrochlorothiazide (HYDRODIURIL) 25 MG tablet TAKE 1 TABLET(25 MG) BY MOUTH DAILY  . levothyroxine (SYNTHROID, LEVOTHROID) 75 MCG tablet TAKE 1 TABLET BY MOUTH EVERY DAY  . lisinopril (PRINIVIL,ZESTRIL) 40 MG tablet TAKE 1 TABLET BY MOUTH EVERY DAY  . loratadine (CLARITIN) 10 MG tablet Take 10 mg by mouth daily.  . pantoprazole (PROTONIX) 40 MG tablet TAKE 1 TABLET BY MOUTH DAILY AT NOON  . PARoxetine (PAXIL) 10 MG tablet TAKE 1 TABLET(10 MG) BY MOUTH DAILY  . rosuvastatin (CRESTOR) 10 MG tablet Take 1 tablet (10 mg total) by mouth daily.  . nitroGLYCERIN (NITROSTAT) 0.4 MG SL tablet PLACE 1 TABLET UNDER TONGUE EVERY 5 MINUTES AS NEEDED FOR CHEST PAIN (Patient not taking: Reported on 11/20/2017)  . Zoster Vaccine Adjuvanted [facility]) injection Inject 0.5 mLs into the muscle once for 1 dose.  . [DISCONTINUED] pantoprazole  (PROTONIX) 40 MG tablet TAKE 1 TABLET BY MOUTH DAILY AT NOON   No facility-administered encounter medications on file as of 04/18/2018.     Activities of Daily Living In your present state of health, do you have any difficulty performing the following activities: 04/18/2018 11/20/2017  Hearing? N N  Vision? N N  Difficulty concentrating or making decisions? Y N  Walking or climbing stairs? N N  Dressing or bathing? N N  Doing errands, shopping? N N  Preparing Food and eating ? N -  Using  the Toilet? N -  In the past six months, have you accidently leaked urine? N -  Do you have problems with loss of bowel control? N -  Managing your Medications? N -  Managing your Finances? N -  Housekeeping or managing your Housekeeping? N -  Some recent data might be hidden    Patient Care Team: Midge Minium, MD as PCP - General Dorothy Spark, MD as PCP - Cardiology (Cardiology) Dorothy Spark, MD as Consulting Physician (Cardiology)    Assessment:   This is a routine wellness examination for Ann Wagner.  Exercise Activities and Dietary recommendations Current Exercise Habits: Home exercise routine(active with antique shows), Type of exercise: walking, Exercise limited by: None identified   Diet (meal preparation, eat out, water intake, caffeinated beverages, dairy products, fruits and vegetables): Drinks water and tea.   Breakfast: toast, tea.  Lunch: egg/protein Dinner: protein and 2 vegetables.   Goals    . Weight (lb) < 165 lb (74.8 kg)     Patient would like to lose weight. Plans to exercise more, walking more frequently. May participate in silver sneakers at [facility].     . Weight (lb) < 165 lb (74.8 kg)     Lose weight by increasing activity.     . Weight (lb) < 165 lb (74.8 kg)     Lose weight by increasing activity and decreasing caloric intake.        Fall Risk Fall Risk  04/18/2018 11/20/2017 04/05/2017 04/05/2017 03/11/2016  Falls in the past year? 0 No Yes No No    Comment - - syncopal episode - -  Number falls in past yr: - - 1 - -  Injury with Fall? - - No - -  Follow up - - Falls prevention discussed - -    Depression Screen PHQ 2/9 Scores 04/18/2018 11/20/2017 04/05/2017 04/05/2017  PHQ - 2 Score 0 0 0 0  PHQ- 9 Score - 0 - 0     Cognitive Function MMSE - Mini Mental State Exam 04/18/2018  Orientation to time 5  Orientation to Place 5  Registration 3  Attention/ Calculation 5  Recall 2  Language- name 2 objects 2  Language- repeat 1  Language- follow 3 step command 3  Language- read & follow direction 1  Write a sentence 1  Copy design 1  Total score 29        Immunization History  Administered Date(s) Administered  . Influenza, High Dose Seasonal PF 11/20/2017  . Influenza,inj,Quad PF,6+ Mos 11/29/2014  . Influenza-Unspecified 12/29/2012, 10/29/2013  . Pneumococcal Conjugate-13 04/30/2014  . Pneumococcal Polysaccharide-23 04/05/2017  . Td 08/23/2016  . Zoster 02/28/2010    Screening Tests Health Maintenance  Topic Date Due  . Hepatitis C Screening  05/18/2018 (Originally 20-Aug-1945)  . MAMMOGRAM  04/28/2019  . COLONOSCOPY  08/29/2019  . TETANUS/TDAP  08/24/2026  . INFLUENZA VACCINE  Completed  . DEXA SCAN  Completed  . PNA vac Low Risk Adult  Completed        Plan:     Schedule mammogram.   Shingles vaccine at pharmacy.   Continue doing brain stimulating activities (puzzles, reading, adult coloring books, staying active) to keep memory sharp.   Bring a copy of your living will and/or healthcare power of attorney to your next office visit.  I have personally reviewed and noted the following in the patient's chart:   . Medical and social history . Use of alcohol, tobacco or illicit drugs  .  Current medications and supplements . Functional ability and status . Nutritional status . Physical activity . Advanced directives . List of other physicians . Hospitalizations, surgeries, and ER visits in previous 12  months . Vitals . Screenings to include cognitive, depression, and falls . Referrals and appointments  In addition, I have reviewed and discussed with patient certain preventive protocols, quality metrics, and best practice recommendations. A written personalized care plan for preventive services as well as general preventive health recommendations were provided to patient.     Gerilyn Nestle, RN  04/18/2018  F/U with PCP 05/23/2018  Reviewed documentation provided by RN and agree w/ above.  Annye Asa, MD

## 2018-04-18 ENCOUNTER — Other Ambulatory Visit: Payer: Self-pay

## 2018-04-18 ENCOUNTER — Ambulatory Visit (INDEPENDENT_AMBULATORY_CARE_PROVIDER_SITE_OTHER): Payer: Medicare Other

## 2018-04-18 VITALS — BP 138/60 | HR 50 | Ht 66.0 in | Wt 175.5 lb

## 2018-04-18 DIAGNOSIS — Z1231 Encounter for screening mammogram for malignant neoplasm of breast: Secondary | ICD-10-CM | POA: Diagnosis not present

## 2018-04-18 DIAGNOSIS — Z0000 Encounter for general adult medical examination without abnormal findings: Secondary | ICD-10-CM | POA: Diagnosis not present

## 2018-04-18 DIAGNOSIS — Z23 Encounter for immunization: Secondary | ICD-10-CM

## 2018-04-18 MED ORDER — ZOSTER VAC RECOMB ADJUVANTED 50 MCG/0.5ML IM SUSR: 0.5000 mL | Freq: Once | INTRAMUSCULAR | 1 refills | Status: AC

## 2018-04-18 NOTE — Patient Instructions (Addendum)
Schedule mammogram.   Shingles vaccine at pharmacy.   Continue doing brain stimulating activities (puzzles, reading, adult coloring books, staying active) to keep memory sharp.   Bring a copy of your living will and/or healthcare power of attorney to your next office visit.   Health Maintenance, Female Adopting a healthy lifestyle and getting preventive care can go a long way to promote health and wellness. Talk with your health care provider about what schedule of regular examinations is right for you. This is a good chance for you to check in with your provider about disease prevention and staying healthy. In between checkups, there are plenty of things you can do on your own. Experts have done a lot of research about which lifestyle changes and preventive measures are most likely to keep you healthy. Ask your health care provider for more information. Weight and diet Eat a healthy diet  Be sure to include plenty of vegetables, fruits, low-fat dairy products, and lean protein.  Do not eat a lot of foods high in solid fats, added sugars, or salt.  Get regular exercise. This is one of the most important things you can do for your health. ? Most adults should exercise for at least 150 minutes each week. The exercise should increase your heart rate and make you sweat (moderate-intensity exercise). ? Most adults should also do strengthening exercises at least twice a week. This is in addition to the moderate-intensity exercise. Maintain a healthy weight  Body mass index (BMI) is a measurement that can be used to identify possible weight problems. It estimates body fat based on height and weight. Your health care provider can help determine your BMI and help you achieve or maintain a healthy weight.  For females 74 years of age and older: ? A BMI below 18.5 is considered underweight. ? A BMI of 18.5 to 24.9 is normal. ? A BMI of 25 to 29.9 is considered overweight. ? A BMI of 30 and above  is considered obese. Watch levels of cholesterol and blood lipids  You should start having your blood tested for lipids and cholesterol at 74 years of age, then have this test every 5 years.  You may need to have your cholesterol levels checked more often if: ? Your lipid or cholesterol levels are high. ? You are older than 74 years of age. ? You are at high risk for heart disease. Cancer screening Lung Cancer  Lung cancer screening is recommended for adults 74-74 years old who are at high risk for lung cancer because of a history of smoking.  A yearly low-dose CT scan of the lungs is recommended for people who: ? Currently smoke. ? Have quit within the past 15 years. ? Have at least a 30-pack-year history of smoking. A pack year is smoking an average of one pack of cigarettes a day for 1 year.  Yearly screening should continue until it has been 15 years since you quit.  Yearly screening should stop if you develop a health problem that would prevent you from having lung cancer treatment. Breast Cancer  Practice breast self-awareness. This means understanding how your breasts normally appear and feel.  It also means doing regular breast self-exams. Let your health care provider know about any changes, no matter how small.  If you are in your 20s or 30s, you should have a clinical breast exam (CBE) by a health care provider every 1-3 years as part of a regular health exam.  If you are  74 or older, have a CBE every year. Also consider having a breast X-ray (mammogram) every year.  If you have a family history of breast cancer, talk to your health care provider about genetic screening.  If you are at high risk for breast cancer, talk to your health care provider about having an MRI and a mammogram every year.  Breast cancer gene (BRCA) assessment is recommended for women who have family members with BRCA-related cancers. BRCA-related cancers  include: ? Breast. ? Ovarian. ? Tubal. ? Peritoneal cancers.  Results of the assessment will determine the need for genetic counseling and BRCA1 and BRCA2 testing. Cervical Cancer Your health care provider may recommend that you be screened regularly for cancer of the pelvic organs (ovaries, uterus, and vagina). This screening involves a pelvic examination, including checking for microscopic changes to the surface of your cervix (Pap test). You may be encouraged to have this screening done every 3 years, beginning at age 74.  For women ages 74-74, health care providers may recommend pelvic exams and Pap testing every 3 years, or they may recommend the Pap and pelvic exam, combined with testing for human papilloma virus (HPV), every 5 years. Some types of HPV increase your risk of cervical cancer. Testing for HPV may also be done on women of any age with unclear Pap test results.  Other health care providers may not recommend any screening for nonpregnant women who are considered low risk for pelvic cancer and who do not have symptoms. Ask your health care provider if a screening pelvic exam is right for you.  If you have had past treatment for cervical cancer or a condition that could lead to cancer, you need Pap tests and screening for cancer for at least 20 years after your treatment. If Pap tests have been discontinued, your risk factors (such as having a new sexual partner) need to be reassessed to determine if screening should resume. Some women have medical problems that increase the chance of getting cervical cancer. In these cases, your health care provider may recommend more frequent screening and Pap tests. Colorectal Cancer  This type of cancer can be detected and often prevented.  Routine colorectal cancer screening usually begins at 74 years of age and continues through 74 years of age.  Your health care provider may recommend screening at an earlier age if you have risk factors for  colon cancer.  Your health care provider may also recommend using home test kits to check for hidden blood in the stool.  A small camera at the end of a tube can be used to examine your colon directly (sigmoidoscopy or colonoscopy). This is done to check for the earliest forms of colorectal cancer.  Routine screening usually begins at age 18.  Direct examination of the colon should be repeated every 5-10 years through 74 years of age. However, you may need to be screened more often if early forms of precancerous polyps or small growths are found. Skin Cancer  Check your skin from head to toe regularly.  Tell your health care provider about any new moles or changes in moles, especially if there is a change in a mole's shape or color.  Also tell your health care provider if you have a mole that is larger than the size of a pencil eraser.  Always use sunscreen. Apply sunscreen liberally and repeatedly throughout the day.  Protect yourself by wearing long sleeves, pants, a wide-brimmed hat, and sunglasses whenever you are outside. Heart  disease, diabetes, and high blood pressure  High blood pressure causes heart disease and increases the risk of stroke. High blood pressure is more likely to develop in: ? People who have blood pressure in the high end of the normal range (130-139/85-89 mm Hg). ? People who are overweight or obese. ? People who are African American.  If you are 98-31 years of age, have your blood pressure checked every 3-5 years. If you are 51 years of age or older, have your blood pressure checked every year. You should have your blood pressure measured twice-once when you are at a hospital or clinic, and once when you are not at a hospital or clinic. Record the average of the two measurements. To check your blood pressure when you are not at a hospital or clinic, you can use: ? An automated blood pressure machine at a pharmacy. ? A home blood pressure monitor.  If you are  between 76 years and 3 years old, ask your health care provider if you should take aspirin to prevent strokes.  Have regular diabetes screenings. This involves taking a blood sample to check your fasting blood sugar level. ? If you are at a normal weight and have a low risk for diabetes, have this test once every three years after 74 years of age. ? If you are overweight and have a high risk for diabetes, consider being tested at a younger age or more often. Preventing infection Hepatitis B  If you have a higher risk for hepatitis B, you should be screened for this virus. You are considered at high risk for hepatitis B if: ? You were born in a country where hepatitis B is common. Ask your health care provider which countries are considered high risk. ? Your parents were born in a high-risk country, and you have not been immunized against hepatitis B (hepatitis B vaccine). ? You have HIV or AIDS. ? You use needles to inject street drugs. ? You live with someone who has hepatitis B. ? You have had sex with someone who has hepatitis B. ? You get hemodialysis treatment. ? You take certain medicines for conditions, including cancer, organ transplantation, and autoimmune conditions. Hepatitis C  Blood testing is recommended for: ? Everyone born from [birth year] through 1966. ? Anyone with known risk factors for hepatitis C. Sexually transmitted infections (STIs)  You should be screened for sexually transmitted infections (STIs) including gonorrhea and chlamydia if: ? You are sexually active and are younger than 75 years of age. ? You are older than 74 years of age and your health care provider tells you that you are at risk for this type of infection. ? Your sexual activity has changed since you were last screened and you are at an increased risk for chlamydia or gonorrhea. Ask your health care provider if you are at risk.  If you do not have HIV, but are at risk, it may be recommended that you take  a prescription medicine daily to prevent HIV infection. This is called pre-exposure prophylaxis (PrEP). You are considered at risk if: ? You are sexually active and do not regularly use condoms or know the HIV status of your partner(s). ? You take drugs by injection. ? You are sexually active with a partner who has HIV. Talk with your health care provider about whether you are at high risk of being infected with HIV. If you choose to begin PrEP, you should first be tested for HIV. You should then be  tested every 3 months for as long as you are taking PrEP. Pregnancy  If you are premenopausal and you may become pregnant, ask your health care provider about preconception counseling.  If you may become pregnant, take 400 to 800 micrograms (mcg) of folic acid every day.  If you want to prevent pregnancy, talk to your health care provider about birth control (contraception). Osteoporosis and menopause  Osteoporosis is a disease in which the bones lose minerals and strength with aging. This can result in serious bone fractures. Your risk for osteoporosis can be identified using a bone density scan.  If you are 61 years of age or older, or if you are at risk for osteoporosis and fractures, ask your health care provider if you should be screened.  Ask your health care provider whether you should take a calcium or vitamin D supplement to lower your risk for osteoporosis.  Menopause may have certain physical symptoms and risks.  Hormone replacement therapy may reduce some of these symptoms and risks. Talk to your health care provider about whether hormone replacement therapy is right for you. Follow these instructions at home:  Schedule regular health, dental, and eye exams.  Stay current with your immunizations.  Do not use any tobacco products including cigarettes, chewing tobacco, or electronic cigarettes.  If you are pregnant, do not drink alcohol.  If you are breastfeeding, limit how  much and how often you drink alcohol.  Limit alcohol intake to no more than 1 drink per day for nonpregnant women. One drink equals 12 ounces of beer, 5 ounces of wine, or 1 ounces of hard liquor.  Do not use street drugs.  Do not share needles.  Ask your health care provider for help if you need support or information about quitting drugs.  Tell your health care provider if you often feel depressed.  Tell your health care provider if you have ever been abused or do not feel safe at home. This information is not intended to replace advice given to you by your health care provider. Make sure you discuss any questions you have with your health care provider. Document Released: 08/30/2010 Document Revised: 07/23/2015 Document Reviewed: 11/18/2014 Elsevier Interactive Patient Education  2019 [employer].

## 2018-04-30 ENCOUNTER — Other Ambulatory Visit: Payer: Self-pay | Admitting: Family Medicine

## 2018-04-30 DIAGNOSIS — Z1231 Encounter for screening mammogram for malignant neoplasm of breast: Secondary | ICD-10-CM

## 2018-05-02 ENCOUNTER — Ambulatory Visit
Admission: RE | Admit: 2018-05-02 | Discharge: 2018-05-02 | Disposition: A | Discharge status: Home / Self Care | Payer: Medicare Other | Source: Ambulatory Visit

## 2018-05-02 DIAGNOSIS — Z1231 Encounter for screening mammogram for malignant neoplasm of breast: Secondary | ICD-10-CM | POA: Diagnosis not present

## 2018-05-03 ENCOUNTER — Ambulatory Visit (HOSPITAL_COMMUNITY)
Admission: RE | Admit: 2018-05-03 | Discharge: 2018-05-03 | Disposition: A | Discharge status: Home / Self Care | Payer: Medicare Other | Source: Ambulatory Visit | Attending: Internal Medicine | Admitting: Internal Medicine

## 2018-05-03 ENCOUNTER — Telehealth: Payer: Self-pay | Admitting: *Deleted

## 2018-05-03 DIAGNOSIS — I6523 Occlusion and stenosis of bilateral carotid arteries: Secondary | ICD-10-CM

## 2018-05-03 DIAGNOSIS — E785 Hyperlipidemia, unspecified: Secondary | ICD-10-CM | POA: Diagnosis not present

## 2018-05-03 NOTE — Telephone Encounter (Signed)
Pt made aware of carotid US results recommendation per Dr Meda Coffee, for her to have a repeat study in one year. Informed the pt that I will place the order in the system and have a [facility] scheduler call her back to arrange this for one year out.  Pt verbalized understanding and agrees with this plan.

## 2018-05-03 NOTE — Telephone Encounter (Signed)
-----   Message from Dorothy Spark, MD sent at 05/03/2018 11:40 AM EST ----- Right ICA a has 40-59% stenosis.  Left ICA has 1-39% stenosis.  Please schedule repeat in 12 months

## 2018-05-22 ENCOUNTER — Telehealth: Payer: Self-pay | Admitting: Physician Assistant

## 2018-05-22 NOTE — Telephone Encounter (Signed)
Call patient about upcoming appointment with me 05/29/2018 due to the recent coronavirus.  Patient would like to reschedule as she is doing fine.  Please reschedule with Dr. Meda Coffee or myself in 4 to 6 months    Primary Cardiologist:  Ena Dawley, MD   Patient contacted.  History reviewed.  No symptoms to suggest any unstable cardiac conditions.  Based on discussion, with current pandemic situation, we will be postponing this appointment for Ann Wagner with a plan for f/u in 3-6 months or sooner if feasible/necessary.  If symptoms change, she has been instructed to contact our office.   Routing to C19 CANCEL pool for tracking (P CV DIV CV19 CANCEL - reason for visit "other.") and assigning priority (1 = 4-6 wks, 2 = 6-12 wks, 3 = >12 wks).   Ermalinda Barrios, PA-C  05/22/2018 10:03 AM          .

## 2018-05-23 ENCOUNTER — Encounter: Payer: Medicare Other | Admitting: Family Medicine

## 2018-05-29 ENCOUNTER — Ambulatory Visit: Payer: Medicare Other | Admitting: Physician Assistant

## 2018-06-02 ENCOUNTER — Other Ambulatory Visit: Payer: Self-pay | Admitting: Cardiology

## 2018-06-05 ENCOUNTER — Other Ambulatory Visit: Payer: Self-pay | Admitting: Family Medicine

## 2018-06-06 ENCOUNTER — Ambulatory Visit: Payer: Medicare Other

## 2018-07-02 ENCOUNTER — Encounter: Payer: Self-pay | Admitting: Family Medicine

## 2018-07-02 ENCOUNTER — Ambulatory Visit (INDEPENDENT_AMBULATORY_CARE_PROVIDER_SITE_OTHER): Payer: Medicare Other | Admitting: Family Medicine

## 2018-07-02 ENCOUNTER — Other Ambulatory Visit: Payer: Self-pay

## 2018-07-02 VITALS — Ht 66.0 in | Wt 175.0 lb

## 2018-07-02 DIAGNOSIS — R109 Unspecified abdominal pain: Secondary | ICD-10-CM | POA: Diagnosis not present

## 2018-07-02 DIAGNOSIS — M7989 Other specified soft tissue disorders: Secondary | ICD-10-CM

## 2018-07-02 NOTE — Progress Notes (Signed)
I have discussed the procedure for the virtual visit with the patient who has given consent to proceed with assessment and treatment.   Mystic Labo, CMA     

## 2018-07-02 NOTE — Progress Notes (Signed)
Virtual Visit via Video   I connected with patient on 07/02/18 at  3:20 PM EDT by a video enabled telemedicine application and verified that I am speaking with the correct person using two identifiers.  Location patient: Home Location provider: [facility], Office Persons participating in the virtual visit: Patient, Provider, [place] ([place])  I discussed the limitations of evaluation and management by telemedicine and the availability of in person appointments. The patient expressed understanding and agreed to proceed.  Subjective:   HPI:   L flank pain- started last week.  Pt reports pain similar to when she had pleurisy.  Pain is 'just under rib cage on the side towards the back'.  Pain is worse when lying down.  'heavy pain is gone but something is there'.  No burning w/ urination or blood noted.  Has increased her water intake.  Has never had kidney stone.  No change in activity level that would cause muscle pain.  'it really hurt for 2 days' but has been improving.    Leg swelling- 'they feel like they weigh ~10 lbs each'.  'it's just more solid'.  Denies SOB.  Pt reports sxs 'for the last few weeks'.  Pt has been 'cheating' on the low salt diet.  Swelling improves overnight.    ROS:   See pertinent positives and negatives per HPI.  Patient Active Problem List   Diagnosis Date Noted  . Abnormal EKG 03/09/2016  . Essential hypertension 06/22/2015  . Carotid artery disease ([place]) 01/12/2015  . Coronary artery disease involving native coronary artery without angina pectoris 01/12/2015  . Angina pectoris ([place]) 01/12/2015  . Abnormal exercise tolerance test 12/16/2014  . Jaw pain 12/11/2014  . Plantar fasciitis, bilateral 08/20/2013  . 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 Rotator cuff dysfunction 03/09/2012  . Shingles 12/20/2011  . General medical examination 10/13/2010  . BACK PAIN 12/02/2009  . Vitamin D deficiency 09/14/2009  . Hypothyroid  06/10/2009  . Hyperlipidemia 06/10/2009  . ALLERGIC RHINITIS 06/10/2009  . GERD 06/10/2009  . DIZZINESS 06/10/2009  . PARESTHESIA 06/10/2009    Social History   Tobacco Use  . Smoking status: Former Research scientist (life sciences)  . Smokeless tobacco: Never Used  Substance Use Topics  . Alcohol use: Yes    Comment: socially    Current Outpatient Medications:  .  albuterol (PROVENTIL HFA;VENTOLIN HFA) 108 (90 Base) MCG/ACT inhaler, Inhale 2 puffs into the lungs every 6 (six) hours as needed for wheezing or shortness of breath., Disp: 1 Inhaler, Rfl: 0 .  amLODipine (NORVASC) 10 MG tablet, TAKE 1 TABLET(10 MG) BY MOUTH DAILY, Disp: 90 tablet, Rfl: 0 .  aspirin EC 81 MG tablet, Take 1 tablet (81 mg total) by mouth daily., Disp: , Rfl:  .  fenofibrate 160 MG tablet, Take 1 tablet (160 mg total) by mouth daily. Please make annual appt with Dr. Meda Coffee for future refills. Thank you, Disp: 90 tablet, Rfl: 0 .  fluticasone (FLONASE) 50 MCG/ACT nasal spray, SHAKE LIQUID AND USE 2 SPRAYS IN EACH NOSTRIL DAILY, Disp: 48 g, Rfl: 0 .  hydrochlorothiazide (HYDRODIURIL) 25 MG tablet, Take 1 tablet (25 mg total) by mouth daily. Please make annual appt with Dr. Meda Coffee for future refills. Thank you, Disp: 90 tablet, Rfl: 0 .  levothyroxine (SYNTHROID, LEVOTHROID) 75 MCG tablet, TAKE 1 TABLET BY MOUTH EVERY DAY, Disp: 30 tablet, Rfl: 6 .  lisinopril (PRINIVIL,ZESTRIL) 40 MG tablet, TAKE 1 TABLET BY MOUTH EVERY DAY, Disp: 30 tablet, Rfl: 6 .  loratadine (CLARITIN) 10 MG tablet, Take 10 mg by mouth daily., Disp: , Rfl:  .  nitroGLYCERIN (NITROSTAT) 0.4 MG SL tablet, PLACE 1 TABLET UNDER TONGUE EVERY 5 MINUTES AS NEEDED FOR CHEST PAIN, Disp: 60 tablet, Rfl: 1 .  pantoprazole (PROTONIX) 40 MG tablet, TAKE 1 TABLET BY MOUTH DAILY AT NOON, Disp: 90 tablet, Rfl: 0 .  PARoxetine (PAXIL) 10 MG tablet, TAKE 1 TABLET(10 MG) BY MOUTH DAILY, Disp: 90 tablet, Rfl: 0 .  potassium chloride (K-DUR) 10 MEQ tablet, Take 10 mEq by mouth once., Disp:  , Rfl:  .  rosuvastatin (CRESTOR) 10 MG tablet, Take 1 tablet (10 mg total) by mouth daily., Disp: 90 tablet, Rfl: 3  Allergies  Allergen Reactions  . Propoxyphene N-Acetaminophen Other (See Comments)    Made sick    Objective:   Ht 5\' 6"  (1.676 m)   Wt 175 lb (79.4 kg)   BMI 28.25 kg/m   AAOx3, NAD NCAT, EOMI No obvious CN deficits Coloring WNL Pt is able to speak clearly, coherently without shortness of breath or increased work of breathing.  Thought process is linear.  Mood is appropriate.   Assessment and Plan:   Flank pain- improving w/o intervention making musculoskeletal cause most likely.  Did discuss possible kidney stone.  Will get UA and encouraged pt to monitor sxs.  Leg swelling- sxs started a few weeks ago which coincides w/ home quarantine and increased takeout.  Maybe lifestyle related but will check labs to assess for metabolic causes vs CHF.  If labs WNL can add Lasix for symptomatic relief.  Reviewed supportive care and red flags that should prompt return.  Pt expressed understanding and is in agreement w/ plan.    Annye Asa, MD 07/02/2018

## 2018-07-04 ENCOUNTER — Other Ambulatory Visit (INDEPENDENT_AMBULATORY_CARE_PROVIDER_SITE_OTHER): Payer: Medicare Other

## 2018-07-04 DIAGNOSIS — R109 Unspecified abdominal pain: Secondary | ICD-10-CM | POA: Diagnosis not present

## 2018-07-04 DIAGNOSIS — M7989 Other specified soft tissue disorders: Secondary | ICD-10-CM | POA: Diagnosis not present

## 2018-07-04 LAB — POCT URINALYSIS DIPSTICK
Bilirubin, UA: NEGATIVE
Blood, UA: NEGATIVE
Glucose, UA: NEGATIVE
Ketones, UA: NEGATIVE
Leukocytes, UA: NEGATIVE
Nitrite, UA: NEGATIVE
Protein, UA: NEGATIVE
Spec Grav, UA: 1.025 (ref 1.010–1.025)
Urobilinogen, UA: 0.2 E.U./dL
pH, UA: 6 (ref 5.0–8.0)

## 2018-07-04 LAB — CBC WITH DIFFERENTIAL/PLATELET
Basophils Absolute: 0.1 10*3/uL (ref 0.0–0.1)
Basophils Relative: 1 % (ref 0.0–3.0)
Eosinophils Absolute: 0.3 10*3/uL (ref 0.0–0.7)
Eosinophils Relative: 3.1 % (ref 0.0–5.0)
HCT: 39.7 % (ref 36.0–46.0)
Hemoglobin: 13.4 g/dL (ref 12.0–15.0)
Lymphocytes Relative: 34.3 % (ref 12.0–46.0)
Lymphs Abs: 3.3 10*3/uL (ref 0.7–4.0)
MCHC: 33.7 g/dL (ref 30.0–36.0)
MCV: 88.8 fl (ref 78.0–100.0)
Monocytes Absolute: 0.9 10*3/uL (ref 0.1–1.0)
Monocytes Relative: 9.4 % (ref 3.0–12.0)
Neutro Abs: 5 10*3/uL (ref 1.4–7.7)
Neutrophils Relative %: 52.2 % (ref 43.0–77.0)
Platelets: 276 10*3/uL (ref 150.0–400.0)
RBC: 4.47 Mil/uL (ref 3.87–5.11)
RDW: 13.4 % (ref 11.5–15.5)
WBC: 9.6 10*3/uL (ref 4.0–10.5)

## 2018-07-04 LAB — HEPATIC FUNCTION PANEL
ALT: 22 U/L (ref 0–35)
AST: 21 U/L (ref 0–37)
Albumin: 4.5 g/dL (ref 3.5–5.2)
Alkaline Phosphatase: 56 U/L (ref 39–117)
Bilirubin, Direct: 0.1 mg/dL (ref 0.0–0.3)
Total Bilirubin: 0.4 mg/dL (ref 0.2–1.2)
Total Protein: 6.7 g/dL (ref 6.0–8.3)

## 2018-07-04 LAB — BASIC METABOLIC PANEL
BUN: 28 mg/dL — ABNORMAL HIGH (ref 6–23)
CO2: 29 mEq/L (ref 19–32)
Calcium: 9.8 mg/dL (ref 8.4–10.5)
Chloride: 103 mEq/L (ref 96–112)
Creatinine, Ser: 1.03 mg/dL (ref 0.40–1.20)
GFR: 52.44 mL/min — ABNORMAL LOW (ref >60.00)
Glucose, Bld: 93 mg/dL (ref 70–99)
Potassium: 4.2 mEq/L (ref 3.5–5.1)
Sodium: 141 mEq/L (ref 135–145)

## 2018-07-04 LAB — BRAIN NATRIURETIC PEPTIDE: Pro B Natriuretic peptide (BNP): 85 pg/mL (ref 0.0–100.0)

## 2018-07-04 LAB — TSH: TSH: 2.74 u[IU]/mL (ref 0.35–4.50)

## 2018-07-05 ENCOUNTER — Other Ambulatory Visit (INDEPENDENT_AMBULATORY_CARE_PROVIDER_SITE_OTHER): Payer: Medicare Other

## 2018-07-05 ENCOUNTER — Ambulatory Visit (INDEPENDENT_AMBULATORY_CARE_PROVIDER_SITE_OTHER): Payer: Medicare Other | Admitting: Family Medicine

## 2018-07-05 ENCOUNTER — Encounter: Payer: Self-pay | Admitting: Family Medicine

## 2018-07-05 ENCOUNTER — Other Ambulatory Visit: Payer: Self-pay

## 2018-07-05 VITALS — BP 144/91 | HR 48 | Ht 65.0 in | Wt 176.0 lb

## 2018-07-05 DIAGNOSIS — I10 Essential (primary) hypertension: Secondary | ICD-10-CM | POA: Diagnosis not present

## 2018-07-05 DIAGNOSIS — E785 Hyperlipidemia, unspecified: Secondary | ICD-10-CM

## 2018-07-05 DIAGNOSIS — E782 Mixed hyperlipidemia: Secondary | ICD-10-CM

## 2018-07-05 DIAGNOSIS — E038 Other specified hypothyroidism: Secondary | ICD-10-CM | POA: Diagnosis not present

## 2018-07-05 LAB — LIPID PANEL
Cholesterol: 145 mg/dL (ref 0–200)
HDL: 45.7 mg/dL (ref >39.00)
LDL Cholesterol: 74 mg/dL (ref 0–99)
NonHDL: 98.84
Total CHOL/HDL Ratio: 3
Triglycerides: 123 mg/dL (ref 0.0–149.0)
VLDL: 24.6 mg/dL (ref 0.0–40.0)

## 2018-07-05 NOTE — Progress Notes (Signed)
Virtual Visit via Video   I connected with patient on 07/05/18 at  1:00 PM EDT by a video enabled telemedicine application and verified that I am speaking with the correct person using two identifiers.  Location patient: Home Location provider: [facility], Office Persons participating in the virtual visit: Patient, Provider, [place] (Jess B)  I discussed the limitations of evaluation and management by telemedicine and the availability of in person appointments. The patient expressed understanding and agreed to proceed.  Subjective:   HPI:   HTN- chronic problem, on Amlodipine 10mg  daily, HCTZ 25mg  daily.  Pt's BP is mildly elevated today but it is better than yesterday.  She reports she has not been eating well- increased salt- over the past week.  Denies CP  Hyperlipidemia- chronic problem, on Crestor 10mg  daily and Fenofibrate 160mg  daily.  Denies abd pain, N/V, myalgias  Hypothyroid- chronic problem.  On Levothyroxine 30mcg daily.  TSH done yesterday was WNL.  Denies fatigue, changes to skin/hair/nails.  ROS:   See pertinent positives and negatives per HPI.  Patient Active Problem List   Diagnosis Date Noted  . Abnormal EKG 03/09/2016  . Essential hypertension 06/22/2015  . Carotid artery disease ([place]) 01/12/2015  . Coronary artery disease involving native coronary artery without angina pectoris 01/12/2015  . Angina pectoris ([place]) 01/12/2015  . Abnormal exercise tolerance test 12/16/2014  . Jaw pain 12/11/2014  . Plantar fasciitis, bilateral 08/20/2013  . 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 Rotator cuff dysfunction 03/09/2012  . Shingles 12/20/2011  . General medical examination 10/13/2010  . BACK PAIN 12/02/2009  . Vitamin D deficiency 09/14/2009  . Hypothyroid 06/10/2009  . Hyperlipidemia 06/10/2009  . ALLERGIC RHINITIS 06/10/2009  . GERD 06/10/2009  . PARESTHESIA 06/10/2009    Social History   Tobacco Use  . Smoking status: Former Research scientist (life sciences)  .  Smokeless tobacco: Never Used  Substance Use Topics  . Alcohol use: Yes    Comment: socially    Current Outpatient Medications:  .  albuterol (PROVENTIL HFA;VENTOLIN HFA) 108 (90 Base) MCG/ACT inhaler, Inhale 2 puffs into the lungs every 6 (six) hours as needed for wheezing or shortness of breath., Disp: 1 Inhaler, Rfl: 0 .  amLODipine (NORVASC) 10 MG tablet, TAKE 1 TABLET(10 MG) BY MOUTH DAILY, Disp: 90 tablet, Rfl: 0 .  aspirin EC 81 MG tablet, Take 1 tablet (81 mg total) by mouth daily., Disp: , Rfl:  .  fenofibrate 160 MG tablet, Take 1 tablet (160 mg total) by mouth daily. Please make annual appt with Dr. Meda Coffee for future refills. Thank you, Disp: 90 tablet, Rfl: 0 .  fluticasone (FLONASE) 50 MCG/ACT nasal spray, SHAKE LIQUID AND USE 2 SPRAYS IN EACH NOSTRIL DAILY, Disp: 48 g, Rfl: 0 .  hydrochlorothiazide (HYDRODIURIL) 25 MG tablet, Take 1 tablet (25 mg total) by mouth daily. Please make annual appt with Dr. Meda Coffee for future refills. Thank you, Disp: 90 tablet, Rfl: 0 .  levothyroxine (SYNTHROID, LEVOTHROID) 75 MCG tablet, TAKE 1 TABLET BY MOUTH EVERY DAY, Disp: 30 tablet, Rfl: 6 .  lisinopril (PRINIVIL,ZESTRIL) 40 MG tablet, TAKE 1 TABLET BY MOUTH EVERY DAY, Disp: 30 tablet, Rfl: 6 .  loratadine (CLARITIN) 10 MG tablet, Take 10 mg by mouth daily., Disp: , Rfl:  .  nitroGLYCERIN (NITROSTAT) 0.4 MG SL tablet, PLACE 1 TABLET UNDER TONGUE EVERY 5 MINUTES AS NEEDED FOR CHEST PAIN, Disp: 60 tablet, Rfl: 1 .  pantoprazole (PROTONIX) 40 MG tablet, TAKE 1 TABLET BY MOUTH DAILY AT  NOON, Disp: 90 tablet, Rfl: 0 .  PARoxetine (PAXIL) 10 MG tablet, TAKE 1 TABLET(10 MG) BY MOUTH DAILY, Disp: 90 tablet, Rfl: 0 .  potassium chloride (K-DUR) 10 MEQ tablet, Take 10 mEq by mouth once., Disp: , Rfl:  .  rosuvastatin (CRESTOR) 10 MG tablet, Take 1 tablet (10 mg total) by mouth daily., Disp: 90 tablet, Rfl: 3  Allergies  Allergen Reactions  . Propoxyphene N-Acetaminophen Other (See Comments)    Made sick     Objective:   BP (!) 144/91   Pulse (!) 48   Ht 5\' 5"  (1.651 m)   Wt 176 lb (79.8 kg)   BMI 29.29 kg/m  AAOx3, NAD NCAT, EOMI No obvious CN deficits Coloring WNL Pt is able to speak clearly, coherently without shortness of breath or increased work of breathing.  Thought process is linear.  Mood is appropriate.   Assessment and Plan:   HTN- chronic problem.  BP has been elevated on 2 occasions this week but she admits that she went overboard w/ [employer] and a family birthday celebration.  Encouraged her to monitor her BPs at home and let me know how they are.  Pt expressed understanding and is in agreement w/ plan.   Hyperlipidemia- tolerating statin w/o difficulty.  Check labs.  Adjust meds prn   Hypothyroid- chronic problem.  Currently asymptomatic.  Recent labs WNL.  No med changes at this time.   Annye Asa, MD 07/05/2018

## 2018-07-05 NOTE — Progress Notes (Signed)
I have discussed the procedure for the virtual visit with the patient who has given consent to proceed with assessment and treatment.   Jessica L Brodmerkel, CMA     

## 2018-07-11 ENCOUNTER — Encounter: Payer: Medicare Other | Admitting: Family Medicine

## 2018-07-12 ENCOUNTER — Other Ambulatory Visit: Payer: Self-pay | Admitting: Family Medicine

## 2018-07-16 ENCOUNTER — Other Ambulatory Visit: Payer: Self-pay | Admitting: Family Medicine

## 2018-08-15 ENCOUNTER — Other Ambulatory Visit: Payer: Self-pay | Admitting: Family Medicine

## 2018-08-15 ENCOUNTER — Other Ambulatory Visit: Payer: Self-pay | Admitting: Physician Assistant

## 2018-08-15 DIAGNOSIS — I10 Essential (primary) hypertension: Secondary | ICD-10-CM

## 2018-08-15 DIAGNOSIS — E782 Mixed hyperlipidemia: Secondary | ICD-10-CM

## 2018-08-15 DIAGNOSIS — I6523 Occlusion and stenosis of bilateral carotid arteries: Secondary | ICD-10-CM

## 2018-08-15 DIAGNOSIS — I2510 Atherosclerotic heart disease of native coronary artery without angina pectoris: Secondary | ICD-10-CM

## 2018-08-28 ENCOUNTER — Other Ambulatory Visit: Payer: Self-pay | Admitting: Cardiology

## 2018-09-02 ENCOUNTER — Other Ambulatory Visit: Payer: Self-pay | Admitting: Family Medicine

## 2018-09-20 ENCOUNTER — Ambulatory Visit (INDEPENDENT_AMBULATORY_CARE_PROVIDER_SITE_OTHER): Payer: Medicare Other | Admitting: Family Medicine

## 2018-09-20 ENCOUNTER — Encounter: Payer: Self-pay | Admitting: Family Medicine

## 2018-09-20 ENCOUNTER — Other Ambulatory Visit: Payer: Self-pay

## 2018-09-20 ENCOUNTER — Encounter: Payer: Medicare Other | Admitting: Family Medicine

## 2018-09-20 VITALS — BP 128/78 | HR 41 | Temp 97.6°F | Resp 16 | Ht 65.0 in | Wt 176.5 lb

## 2018-09-20 DIAGNOSIS — E782 Mixed hyperlipidemia: Secondary | ICD-10-CM

## 2018-09-20 DIAGNOSIS — E559 Vitamin D deficiency, unspecified: Secondary | ICD-10-CM

## 2018-09-20 DIAGNOSIS — Z0000 Encounter for general adult medical examination without abnormal findings: Secondary | ICD-10-CM | POA: Diagnosis not present

## 2018-09-20 DIAGNOSIS — I6523 Occlusion and stenosis of bilateral carotid arteries: Secondary | ICD-10-CM

## 2018-09-20 DIAGNOSIS — I10 Essential (primary) hypertension: Secondary | ICD-10-CM | POA: Diagnosis not present

## 2018-09-20 LAB — CBC WITH DIFFERENTIAL/PLATELET
Basophils Absolute: 0.1 10*3/uL (ref 0.0–0.1)
Basophils Relative: 0.9 % (ref 0.0–3.0)
Eosinophils Absolute: 0.2 10*3/uL (ref 0.0–0.7)
Eosinophils Relative: 2.3 % (ref 0.0–5.0)
HCT: 43.3 % (ref 36.0–46.0)
Hemoglobin: 14.3 g/dL (ref 12.0–15.0)
Lymphocytes Relative: 34.5 % (ref 12.0–46.0)
Lymphs Abs: 2.6 10*3/uL (ref 0.7–4.0)
MCHC: 33 g/dL (ref 30.0–36.0)
MCV: 90.1 fl (ref 78.0–100.0)
Monocytes Absolute: 0.7 10*3/uL (ref 0.1–1.0)
Monocytes Relative: 8.8 % (ref 3.0–12.0)
Neutro Abs: 4.1 10*3/uL (ref 1.4–7.7)
Neutrophils Relative %: 53.5 % (ref 43.0–77.0)
Platelets: 296 10*3/uL (ref 150.0–400.0)
RBC: 4.8 Mil/uL (ref 3.87–5.11)
RDW: 13.6 % (ref 11.5–15.5)
WBC: 7.6 10*3/uL (ref 4.0–10.5)

## 2018-09-20 LAB — BASIC METABOLIC PANEL
BUN: 27 mg/dL — ABNORMAL HIGH (ref 6–23)
CO2: 29 mEq/L (ref 19–32)
Calcium: 10.4 mg/dL (ref 8.4–10.5)
Chloride: 102 mEq/L (ref 96–112)
Creatinine, Ser: 1.01 mg/dL (ref 0.40–1.20)
GFR: 53.61 mL/min — ABNORMAL LOW (ref >60.00)
Glucose, Bld: 111 mg/dL — ABNORMAL HIGH (ref 70–99)
Potassium: 4.3 mEq/L (ref 3.5–5.1)
Sodium: 140 mEq/L (ref 135–145)

## 2018-09-20 LAB — HEPATIC FUNCTION PANEL
ALT: 23 U/L (ref 0–35)
AST: 23 U/L (ref 0–37)
Albumin: 5 g/dL (ref 3.5–5.2)
Alkaline Phosphatase: 64 U/L (ref 39–117)
Bilirubin, Direct: 0.1 mg/dL (ref 0.0–0.3)
Total Bilirubin: 0.4 mg/dL (ref 0.2–1.2)
Total Protein: 7.3 g/dL (ref 6.0–8.3)

## 2018-09-20 LAB — TSH: TSH: 2.25 u[IU]/mL (ref 0.35–4.50)

## 2018-09-20 LAB — LIPID PANEL
Cholesterol: 157 mg/dL (ref 0–200)
HDL: 49.1 mg/dL (ref >39.00)
LDL Cholesterol: 83 mg/dL (ref 0–99)
NonHDL: 108.17
Total CHOL/HDL Ratio: 3
Triglycerides: 125 mg/dL (ref 0.0–149.0)
VLDL: 25 mg/dL (ref 0.0–40.0)

## 2018-09-20 LAB — VITAMIN D 25 HYDROXY (VIT D DEFICIENCY, FRACTURES): VITD: 18.53 ng/mL — ABNORMAL LOW (ref 30.00–100.00)

## 2018-09-20 NOTE — Assessment & Plan Note (Signed)
Pt's PE WNL w/ exception of being overweight.  UTD on mammo, colonoscopy, immunizations.  Check labs.  Anticipatory guidance provided.  

## 2018-09-20 NOTE — Assessment & Plan Note (Signed)
Chronic problem.  Currently well controlled.  Asymptomatic.  Check labs.  No anticipated med changes.  Will follow. 

## 2018-09-20 NOTE — Assessment & Plan Note (Signed)
Check labs and replete prn. 

## 2018-09-20 NOTE — Assessment & Plan Note (Signed)
Chronic problem.  Tolerating statin w/o difficulty.  Check labs.  Adjust meds prn  

## 2018-09-20 NOTE — Assessment & Plan Note (Signed)
Following w/ Dr Meda Coffee

## 2018-09-20 NOTE — Patient Instructions (Addendum)
Follow up in 6 months to recheck BP and cholesterol We'll notify you of your lab results and make any changes if needed Continue to work on healthy diet and regular exercise- you can do it!! Call with any questions or concerns Stay Safe!!!

## 2018-09-20 NOTE — Progress Notes (Signed)
   Subjective:    Patient ID: Ann Wagner, female    DOB: 1946/02/20, 73 y.o.   MRN: [id number]  HPI CPE- UTD on mammo, colonoscopy, immunizations.   Review of Systems Patient reports no vision/ hearing changes, adenopathy,fever, weight change,  persistant/recurrent hoarseness , swallowing issues, chest pain, palpitations, edema, persistant/recurrent cough, hemoptysis, dyspnea (rest/exertional/paroxysmal nocturnal), gastrointestinal bleeding (melena, rectal bleeding), abdominal pain, significant heartburn, bowel changes, GU symptoms (dysuria, hematuria, incontinence), Gyn symptoms (abnormal  bleeding, pain),  syncope, focal weakness, memory loss, numbness & tingling, skin/hair/nail changes, abnormal bruising or bleeding, anxiety, or depression.     Objective:   Physical Exam General Appearance:    Alert, cooperative, no distress, appears stated age  Head:    Normocephalic, without obvious abnormality, atraumatic  Eyes:    PERRL, conjunctiva/corneas clear, EOM's intact, fundi    benign, both eyes  Ears:    Normal TM's and external ear canals, both ears  Nose:   Nares normal, septum midline, mucosa normal, no drainage    or sinus tenderness  Throat:   Lips, mucosa, and tongue normal; teeth and gums normal  Neck:   Supple, symmetrical, trachea midline, no adenopathy;    Thyroid: no enlargement/tenderness/nodules  Back:     Symmetric, no curvature, ROM normal, no CVA tenderness  Lungs:     Clear to auscultation bilaterally, respirations unlabored  Chest Wall:    No tenderness or deformity   Heart:    Regular rate and rhythm, S1 and S2 normal, no murmur, rub   or gallop  Breast Exam:    Deferred to mammo  Abdomen:     Soft, non-tender, bowel sounds active all four quadrants,    no masses, no organomegaly  Genitalia:    Deferred  Rectal:    Extremities:   Extremities normal, atraumatic, no cyanosis or edema  Pulses:   2+ and symmetric all extremities  Skin:   Skin color, texture,  turgor normal, no rashes or lesions  Lymph nodes:   Cervical, supraclavicular, and axillary nodes normal  Neurologic:   CNII-XII intact, normal strength, sensation and reflexes    throughout          Assessment & Plan:

## 2018-09-21 ENCOUNTER — Other Ambulatory Visit: Payer: Self-pay | Admitting: General Practice

## 2018-09-21 MED ORDER — VITAMIN D (ERGOCALCIFEROL) 1.25 MG (50000 UNIT) PO CAPS: 50000.0000 [IU] | ORAL_CAPSULE | ORAL | 0 refills | Status: DC

## 2018-10-07 ENCOUNTER — Other Ambulatory Visit: Payer: Self-pay | Admitting: Family Medicine

## 2018-11-01 ENCOUNTER — Other Ambulatory Visit: Payer: Self-pay | Admitting: Family Medicine

## 2018-11-09 ENCOUNTER — Other Ambulatory Visit: Payer: Self-pay | Admitting: Cardiology

## 2018-11-09 DIAGNOSIS — I10 Essential (primary) hypertension: Secondary | ICD-10-CM

## 2018-11-09 DIAGNOSIS — I6523 Occlusion and stenosis of bilateral carotid arteries: Secondary | ICD-10-CM

## 2018-11-09 DIAGNOSIS — E782 Mixed hyperlipidemia: Secondary | ICD-10-CM

## 2018-11-09 DIAGNOSIS — I2510 Atherosclerotic heart disease of native coronary artery without angina pectoris: Secondary | ICD-10-CM

## 2018-11-09 MED ORDER — ROSUVASTATIN CALCIUM 10 MG PO TABS: ORAL_TABLET | ORAL | 0 refills | Status: DC

## 2018-11-14 ENCOUNTER — Telehealth: Payer: Self-pay | Admitting: Family Medicine

## 2018-11-14 ENCOUNTER — Other Ambulatory Visit: Payer: Self-pay

## 2018-11-14 MED ORDER — AMLODIPINE BESYLATE 10 MG PO TABS: ORAL_TABLET | ORAL | 0 refills | Status: DC

## 2018-11-14 NOTE — Telephone Encounter (Signed)
Can we resend in the Amlodipine pt states that the pharmacy hasn't received anything from our office. Pt uses [facility] on [address] and [address]

## 2018-11-14 NOTE — Telephone Encounter (Signed)
Medication filled to pharmacy as requested.   

## 2018-11-22 ENCOUNTER — Other Ambulatory Visit: Payer: Self-pay | Admitting: Cardiology

## 2018-11-29 ENCOUNTER — Other Ambulatory Visit: Payer: Self-pay | Admitting: General Practice

## 2018-11-29 MED ORDER — PAROXETINE HCL 10 MG PO TABS: ORAL_TABLET | ORAL | 0 refills | Status: DC

## 2018-12-03 NOTE — Progress Notes (Signed)
Cardiology Office Note    Date:  12/04/2018   ID:  Ann Wagner, DOB 03-Dec-1945, MRN [id number]  PCP:  Midge Minium, MD  Cardiologist: Ena Dawley, MD EPS: None  Chief Complaint  Patient presents with  . Follow-up    History of Present Illness:  Ann Wagner is a 73 y.o. female a history of hypertension, HLD, vasovagal syncope, abnormal GXT which led to cardiac CTA showing a calcium score of 580 with diffuse proximal to mid LAD lesion.  She underwent DES to the mid LAD 01/12/15 with 20% proximal RCA LVEF 65%.   Last saw Dr. Meda Coffee 02/2016 at which time she was doing well.  Plavix was stopped and she was continued on aspirin 81 mg daily.  2D echo 05/2016 because of an abnormal EKG showed normal LVEF 65-70% with dynamic obstruction at rest in the mid cavity with a peak velocity of 200 cm/s and a peak gradient of 16 mmHg.  I saw patient 04/2017 and doing well. I increased Crestor 10 mg daily.  Patient comes in for yearly check up. Does antiques, gardening. Walks 30-45 min 3 times a week. Denies chest pain, shortness of breath, dizziness or presyncope. At the end of her walk the last lap is a hill  and she feels she has to put extra effort in and breathing are faster than before. Legs get achy when she walks on treadmill but not outside. She never had symptoms with stent 4 yrs ago. Picked up on screening GXT b/c of family history of CAD.   Past Medical History:  Diagnosis Date  . Allergic rhinitis   . Arthritis   . Coronary artery disease    cath 01/12/2015 single vessel CAD, DES to mid LAD  . GERD (gastroesophageal reflux disease)   . Headache   . History of chicken pox   . Hyperlipidemia   . Hypertension   . Hyperthyroidism   . Syncope     Past Surgical History:  Procedure Laterality Date  . CARDIAC CATHETERIZATION N/A 01/12/2015   Procedure: Left Heart Cath and Coronary Angiography;  Surgeon: Peter M Martinique, MD;  Location: [facility];  Service:  Cardiovascular;  Laterality: N/A;  . CARDIAC CATHETERIZATION Right 01/12/2015   Procedure: Coronary Stent Intervention;  Surgeon: Peter M Martinique, MD;  Location: [facility];  Service: Cardiovascular;  Laterality: Right;  . CORONARY STENT PLACEMENT  01/12/2015   des to [place]  . TUBAL LIGATION      Current Medications: Current Meds  Medication Sig  . albuterol (PROVENTIL HFA;VENTOLIN HFA) 108 (90 Base) MCG/ACT inhaler Inhale 2 puffs into the lungs every 6 (six) hours as needed for wheezing or shortness of breath.  Marland Kitchen amLODipine (NORVASC) 10 MG tablet TAKE 1 TABLET(10 MG) BY MOUTH DAILY  . aspirin EC 81 MG tablet Take 1 tablet (81 mg total) by mouth daily.  . fenofibrate 160 MG tablet Take 1 tablet (160 mg total) by mouth daily. Please make overdue appt with Dr. Meda Coffee before anymore refills. 2nd attempt  . fluticasone (FLONASE) 50 MCG/ACT nasal spray SHAKE LIQUID AND USE 2 SPRAYS IN EACH NOSTRIL DAILY  . hydrochlorothiazide (HYDRODIURIL) 25 MG tablet TAKE 1 TABLET BY MOUTH DAILY  . levothyroxine (SYNTHROID) 75 MCG tablet TAKE 1 TABLET BY MOUTH EVERY DAY  . lisinopril (ZESTRIL) 40 MG tablet TAKE 1 TABLET BY MOUTH EVERY DAY  . loratadine (CLARITIN) 10 MG tablet Take 10 mg by mouth daily.  . nitroGLYCERIN (NITROSTAT) 0.4  MG SL tablet PLACE 1 TABLET UNDER TONGUE EVERY 5 MINUTES AS NEEDED FOR CHEST PAIN  . pantoprazole (PROTONIX) 40 MG tablet TAKE 1 TABLET BY MOUTH DAILY AT NOON  . PARoxetine (PAXIL) 10 MG tablet TAKE 1 TABLET(10 MG) BY MOUTH DAILY  . potassium chloride (K-DUR) 10 MEQ tablet Take 10 mEq by mouth once.  . rosuvastatin (CRESTOR) 20 MG tablet Take 1 tablet (20 mg total) by mouth daily.  . Vitamin D, Ergocalciferol, (DRISDOL) 1.25 MG (50000 UT) CAPS capsule Take 1 capsule (50,000 Units total) by mouth every 7 (seven) days.  . [DISCONTINUED] nitroGLYCERIN (NITROSTAT) 0.4 MG SL tablet PLACE 1 TABLET UNDER TONGUE EVERY 5 MINUTES AS NEEDED FOR CHEST PAIN  . [DISCONTINUED]  rosuvastatin (CRESTOR) 10 MG tablet TAKE 1 TABLET(10 MG) BY MOUTH DAILY. Please keep upcoming appt in October before anymore refills. Thanks you     Allergies:   Propoxyphene n-acetaminophen   Social History   Socioeconomic History  . Marital status: Divorced    Spouse name: Not on file  . Number of children: Not on file  . Years of education: Not on file  . Highest education level: Not on file  Occupational History  . Not on file  Social Needs  . Financial resource strain: Not on file  . Food insecurity    Worry: Not on file    Inability: Not on file  . Transportation needs    Medical: Not on file    Non-medical: Not on file  Tobacco Use  . Smoking status: Former Research scientist (life sciences)  . Smokeless tobacco: Never Used  Substance and Sexual Activity  . Alcohol use: Yes    Comment: socially  . Drug use: No  . Sexual activity: Not on file  Lifestyle  . Physical activity    Days per week: Not on file    Minutes per session: Not on file  . Stress: Not on file  Relationships  . Social Herbalist on phone: Not on file    Gets together: Not on file    Attends religious service: Not on file    Active member of club or organization: Not on file    Attends meetings of clubs or organizations: Not on file    Relationship status: Not on file  Other Topics Concern  . Not on file  Social History Narrative   Lives alone.   2 sons - 1 locally with 5 kids.   Retired Nurse, learning disability, enjoys antique shows.     Family History:  The patient's   family history includes Coronary artery disease in her father, mother, and paternal grandmother; Diabetes in her mother; Heart attack in her mother; Heart disease in her son; Hypertension in her father, mother, paternal grandmother, and son.   ROS:   Please see the history of present illness.    ROS All other systems reviewed and are negative.   PHYSICAL EXAM:   VS:  BP (!) 142/68   Pulse (!) 47   Ht 5\' 5"  (1.651 m)   Wt 178 lb 3.2 oz  (80.8 kg)   SpO2 97%   BMI 29.65 kg/m   Physical Exam  GEN: Well nourished, well developed, in no acute distress  Neck: bilateral carotid bruits R>L,no JVD,  or masses Cardiac:RRR; S4 no murmurs, rubs,  Respiratory:  clear to auscultation bilaterally, normal work of breathing GI: soft, nontender, nondistended, + BS Ext: without cyanosis, clubbing, or edema, Good distal pulses bilaterally Neuro:  Alert and  Oriented x 3 Psych: euthymic mood, full affect  Wt Readings from Last 3 Encounters:  12/04/18 178 lb 3.2 oz (80.8 kg)  09/20/18 176 lb 8 oz (80.1 kg)  07/05/18 176 lb (79.8 kg)      Studies/Labs Reviewed:   EKG:  EKG is ordered today.  The ekg ordered today demonstrates sinus bradycardia 47/m no acute change.  Recent Labs: 07/04/2018: Pro B Natriuretic peptide (BNP) 85.0 09/20/2018: ALT 23; BUN 27; Creatinine, Ser 1.01; Hemoglobin 14.3; Platelets 296.0; Potassium 4.3; Sodium 140; TSH 2.25   Lipid Panel    Component Value Date/Time   CHOL 157 09/20/2018 1208   CHOL 139 08/01/2017 0758   TRIG 125.0 09/20/2018 1208   HDL 49.10 09/20/2018 1208   HDL 49 08/01/2017 0758   CHOLHDL 3 09/20/2018 1208   VLDL 25.0 09/20/2018 1208   LDLCALC 83 09/20/2018 1208   LDLCALC 75 08/01/2017 0758   LDLDIRECT 92.9 12/20/2011 0924    Additional studies/ records that were reviewed today include:   Carotid dopplers 04/2018 Summary: Right Carotid: Velocities in the right ICA are consistent with a 40-59%                stenosis. The ECA appears >50% stenosed.   Left Carotid: Velocities in the left ICA are consistent with a 1-39% stenosis.               Non-hemodynamically significant plaque noted in the CCA.   Vertebrals:  Bilateral vertebral arteries demonstrate antegrade flow. Subclavians: Normal flow hemodynamics were seen in bilateral subclavian              arteries.   *See table(s) above for measurements and observations. Suggest follow up study in 12 months.    2D echo 05/30/16    Study Conclusions   - Left ventricle: The cavity size was normal. Wall thickness was   normal. Systolic function was vigorous. The estimated ejection   fraction was in the range of 65% to 70%. There was dynamic   obstruction at restin the mid cavity, with a peak velocity of 200   cm/sec and a peak gradient of 16 mm Hg. Wall motion was normal;   there were no regional wall motion abnormalities. Left   ventricular diastolic function parameters were normal. - Aortic valve: There was trivial regurgitation.   Cardiac cath 11/2016Conclusion     Prox RCA lesion, 20% stenosed.  The left ventricular systolic function is normal.  Prox LAD to Mid LAD lesion, 90% stenosed. Post intervention, there is a 0% residual stenosis.   1. Single vessel obstructive CAD 2. Normal LV function 3. Successful stenting of the mid LAD with DES.   Plan: DAPT for one year. Anticipate DC in am.          ASSESSMENT:    1. Coronary artery disease involving native coronary artery of native heart without angina pectoris   2. Essential hypertension   3. Bilateral carotid artery stenosis   4. Hyperlipidemia, unspecified hyperlipidemia type   5. DOE (dyspnea on exertion)   6. Mixed hyperlipidemia   7. Bradycardia      PLAN:  In order of problems listed above:   CAD stent mLAD 2016, normal LVEF 05/2016 echo-feels like she's working harder when she goes up a hill. Is bradycardic at 47 at rest. Fit bit says in 40's at night. She was asymptomatic with first stent. Had a screening GXT because of family history of CAD that was abnormal. Lexiscan myoview. F/u with  me after testing.  HTN BP controlled  Bilateral carotid artery stenosis R ICA 123456 stenosis, LICA 123456 stenosed Dopplers 04/2018   HLD LDL 74 in May, 83 08/2018.  Increase crestor 20 mg daily.   sinus bradycardia at 47/m. Has been in the 75's on EKG since 2011. May be contributing to her recent fatigue and extra effort needed to walk up hill.not  on rate lowering meds and TSH normal in July. 30 day monitor.    Medication Adjustments/Labs and Tests Ordered: Current medicines are reviewed at length with the patient today.  Concerns regarding medicines are outlined above.  Medication changes, Labs and Tests ordered today are listed in the Patient Instructions below. Patient Instructions  Medication Instructions:  Your physician has recommended you make the following change in your medication:   INCREASE: rosuvastatin (crestor) to 20 mg by mouth once a day   Lab work: Your physician recommends that you return for a FASTING lipid profile and liver function panel in 3 months   If you have labs (blood work) drawn today and your tests are completely normal, you will receive your results only by: Marland Kitchen MyChart Message (if you have MyChart) OR . A paper copy in the mail If you have any lab test that is abnormal or we need to change your treatment, we will call you to review the results.  Testing/Procedures: Your physician has recommended that you wear an event monitor. Event monitors are medical devices that record the heart's electrical activity. Doctors most often [place] these monitors to diagnose arrhythmias. Arrhythmias are problems with the speed or rhythm of the heartbeat. The monitor is a small, portable device. You can wear one while you do your normal daily activities. This is usually used to diagnose what is causing palpitations/syncope (passing out).  Your physician has requested that you have a lexiscan myoview. For further information please visit HugeFiesta.[email or web address]. Please follow instruction sheet, as given.  Follow-Up: . Follow up with Ermalinda Barrios, PA via VIRTUAL VISIT after tests are done  Any Other Special Instructions Will Be Listed Below (If Applicable).       Sumner Boast, PA-C  12/04/2018 10:39 AM    [facility] [address], [place], [place]  [address] Phone: [phone]; Fax:  [phone]

## 2018-12-04 ENCOUNTER — Telehealth (HOSPITAL_COMMUNITY): Payer: Self-pay | Admitting: *Deleted

## 2018-12-04 ENCOUNTER — Other Ambulatory Visit: Payer: Self-pay

## 2018-12-04 ENCOUNTER — Encounter: Payer: Self-pay | Admitting: Physician Assistant

## 2018-12-04 ENCOUNTER — Ambulatory Visit: Payer: Medicare Other | Admitting: Physician Assistant

## 2018-12-04 VITALS — BP 142/68 | HR 47 | Ht 65.0 in | Wt 178.2 lb

## 2018-12-04 DIAGNOSIS — I10 Essential (primary) hypertension: Secondary | ICD-10-CM

## 2018-12-04 DIAGNOSIS — I6523 Occlusion and stenosis of bilateral carotid arteries: Secondary | ICD-10-CM | POA: Diagnosis not present

## 2018-12-04 DIAGNOSIS — R0600 Dyspnea, unspecified: Secondary | ICD-10-CM | POA: Diagnosis not present

## 2018-12-04 DIAGNOSIS — E782 Mixed hyperlipidemia: Secondary | ICD-10-CM

## 2018-12-04 DIAGNOSIS — I2510 Atherosclerotic heart disease of native coronary artery without angina pectoris: Secondary | ICD-10-CM

## 2018-12-04 DIAGNOSIS — R0609 Other forms of dyspnea: Secondary | ICD-10-CM

## 2018-12-04 DIAGNOSIS — E785 Hyperlipidemia, unspecified: Secondary | ICD-10-CM | POA: Diagnosis not present

## 2018-12-04 DIAGNOSIS — R001 Bradycardia, unspecified: Secondary | ICD-10-CM

## 2018-12-04 MED ORDER — NITROGLYCERIN 0.4 MG SL SUBL: SUBLINGUAL_TABLET | SUBLINGUAL | 3 refills | Status: DC

## 2018-12-04 MED ORDER — ROSUVASTATIN CALCIUM 20 MG PO TABS: 20.0000 mg | ORAL_TABLET | Freq: Every day | ORAL | 3 refills | Status: DC

## 2018-12-04 NOTE — Telephone Encounter (Signed)
Patient given detailed instructions per Myocardial Perfusion Study Information Sheet for the test on 12/07/18 at 7:45. Patient notified to arrive 15 minutes early and that it is imperative to arrive on time for appointment to keep from having the test rescheduled.  If you need to cancel or reschedule your appointment, please call the office within 24 hours of your appointment. . Patient verbalized understanding.Ann Wagner

## 2018-12-04 NOTE — Patient Instructions (Addendum)
Medication Instructions:  Your physician has recommended you make the following change in your medication:   INCREASE: rosuvastatin (crestor) to 20 mg by mouth once a day   Lab work: Your physician recommends that you return for a FASTING lipid profile and liver function panel in 3 months   If you have labs (blood work) drawn today and your tests are completely normal, you will receive your results only by: Marland Kitchen MyChart Message (if you have MyChart) OR . A paper copy in the mail If you have any lab test that is abnormal or we need to change your treatment, we will call you to review the results.  Testing/Procedures: Your physician has recommended that you wear an event monitor. Event monitors are medical devices that record the heart's electrical activity. Doctors most often [place] these monitors to diagnose arrhythmias. Arrhythmias are problems with the speed or rhythm of the heartbeat. The monitor is a small, portable device. You can wear one while you do your normal daily activities. This is usually used to diagnose what is causing palpitations/syncope (passing out).  Your physician has requested that you have a lexiscan myoview. For further information please visit [email or web address]. Please follow instruction sheet, as given.  Follow-Up: . Follow up with Ermalinda Barrios, PA via VIRTUAL VISIT after tests are done  Any Other Special Instructions Will Be Listed Below (If Applicable).

## 2018-12-05 ENCOUNTER — Telehealth: Payer: Self-pay | Admitting: *Deleted

## 2018-12-05 NOTE — Telephone Encounter (Signed)
[employer] to ship a 30 day cardiac event monitor to the patients home.  Instructions reviewed briefly as they are included in the monitor kit. 

## 2018-12-07 ENCOUNTER — Ambulatory Visit (HOSPITAL_COMMUNITY): Payer: Medicare Other | Attending: Cardiovascular Disease

## 2018-12-07 ENCOUNTER — Other Ambulatory Visit: Payer: Self-pay

## 2018-12-07 DIAGNOSIS — R0609 Other forms of dyspnea: Secondary | ICD-10-CM

## 2018-12-07 DIAGNOSIS — R0600 Dyspnea, unspecified: Secondary | ICD-10-CM | POA: Diagnosis not present

## 2018-12-07 LAB — MYOCARDIAL PERFUSION IMAGING
LV dias vol: 75 mL (ref 46–106)
LV sys vol: 24 mL
Peak HR: 62 {beats}/min
Rest HR: 46 {beats}/min
SDS: 1
SRS: 1
SSS: 2
TID: 1.03

## 2018-12-07 MED ORDER — TECHNETIUM TC 99M TETROFOSMIN IV KIT
10.7000 | PACK | Freq: Once | INTRAVENOUS | Status: AC | PRN
  Administered 2018-12-07: 10.7 via INTRAVENOUS
  Filled 2018-12-07: qty 11

## 2018-12-07 MED ORDER — TECHNETIUM TC 99M TETROFOSMIN IV KIT
32.6000 | PACK | Freq: Once | INTRAVENOUS | Status: AC | PRN
  Administered 2018-12-07: 32.6 via INTRAVENOUS
  Filled 2018-12-07: qty 33

## 2018-12-07 MED ORDER — REGADENOSON 0.4 MG/5ML IV SOLN
0.4000 mg | Freq: Once | INTRAVENOUS | Status: AC
  Administered 2018-12-07: 0.4 mg via INTRAVENOUS

## 2018-12-11 ENCOUNTER — Encounter (HOSPITAL_COMMUNITY): Payer: Medicare Other

## 2018-12-18 ENCOUNTER — Ambulatory Visit (INDEPENDENT_AMBULATORY_CARE_PROVIDER_SITE_OTHER): Payer: Medicare Other

## 2018-12-18 DIAGNOSIS — R001 Bradycardia, unspecified: Secondary | ICD-10-CM | POA: Diagnosis not present

## 2018-12-25 ENCOUNTER — Telehealth: Payer: Medicare Other | Admitting: Physician Assistant

## 2018-12-25 ENCOUNTER — Telehealth: Payer: Self-pay | Admitting: Physician Assistant

## 2018-12-25 NOTE — Telephone Encounter (Signed)
Patient does not have an implanted cardiac device. Re-routed to triage pool.

## 2018-12-25 NOTE — Telephone Encounter (Signed)
[facility] noted auto triggered event today. Patient had 1 minutes of atrial flutter at 70s at 1230 central time. Strip already sent to office for review. Will send again.  Patient will need phone call after confirming atrial flutter.

## 2018-12-26 NOTE — Telephone Encounter (Signed)
Report received.  Report stating AFlutter rate 70 yesterday at 1:48 pm EST. Pt reports she was on her daily walk at the time.  No issues/symptoms to report.  Monitor report was artifact, not AFLutter.  Reviewed w/ DOD, Dr. Burt Knack, who agreed w/ artifact.   Will continue to monitor. Pt advised.

## 2019-01-05 ENCOUNTER — Other Ambulatory Visit: Payer: Self-pay | Admitting: General Practice

## 2019-01-05 MED ORDER — PANTOPRAZOLE SODIUM 40 MG PO TBEC: DELAYED_RELEASE_TABLET | ORAL | 0 refills | Status: DC

## 2019-01-15 ENCOUNTER — Telehealth: Payer: Self-pay

## 2019-01-15 DIAGNOSIS — R001 Bradycardia, unspecified: Secondary | ICD-10-CM | POA: Insufficient documentation

## 2019-01-15 NOTE — Progress Notes (Signed)
Virtual Visit via Video Note   This visit type was conducted due to national recommendations for restrictions regarding the COVID-19 Pandemic (e.g. social distancing) in an effort to limit this patient's exposure and mitigate transmission in our community.  Due to her co-morbid illnesses, this patient is at least at moderate risk for complications without adequate follow up.  This format is felt to be most appropriate for this patient at this time.  All issues noted in this document were discussed and addressed.  A limited physical exam was performed with this format.  Please refer to the patient's chart for her consent to telehealth for [facility].   Date:  01/16/2019   ID:  Ann Wagner, DOB 11-Dec-1945, MRN [id number]  Patient Location: Home Provider Location: Home  PCP:  Midge Minium, MD  Cardiologist:  Ena Dawley, MD  Electrophysiologist:  None   Evaluation Performed:  Follow-Up Visit  Chief Complaint:  Follow up  History of Present Illness:    Ann Wagner is a 73 y.o. female with a history of hypertension, HLD, vasovagal syncope, abnormal GXT which led to cardiac CTA showing a calcium score of 580 with diffuse proximal to mid LAD lesion.  She underwent DES to the mid LAD 01/12/15 with 20% proximal RCA LVEF 65%.   Last saw Dr. Meda Coffee 02/2016 at which time she was doing well.  Plavix was stopped and she was continued on aspirin 81 mg daily.  2D echo 05/2016 because of an abnormal EKG showed normal LVEF 65-70% with dynamic obstruction at rest in the mid cavity with a peak velocity of 200 cm/s and a peak gradient of 16 mmHg.   I saw patient 12/07/2018 complaining of feeling that she has to work a lot harder when she goes up a hill.  She was bradycardic at 47 bpm at rest her Fitbit says it is in the 40s at night.  She was asymptomatic with her first stent and was picked up just on screening GXT because of family history of CAD.  Stress test was done 12/04/2018 normal low  risk study EF 68%.  30-day monitor worn and some strips question atrial flutter but Dr. Burt Knack reviewed it  was artifact.  Patient says she is feeling better. Doesn't have to work as hard with exercise. She thinks it's better since weather has gotten cooler. Heart rate in 50's. No complaints today.  The patient does not have symptoms concerning for COVID-19 infection (fever, chills, cough, or new shortness of breath).    Past Medical History:  Diagnosis Date  . Allergic rhinitis   . Arthritis   . Coronary artery disease    cath 01/12/2015 single vessel CAD, DES to mid LAD  . GERD (gastroesophageal reflux disease)   . Headache   . History of chicken pox   . Hyperlipidemia   . Hypertension   . Hyperthyroidism   . Syncope    Past Surgical History:  Procedure Laterality Date  . CARDIAC CATHETERIZATION N/A 01/12/2015   Procedure: Left Heart Cath and Coronary Angiography;  Surgeon: Peter M Martinique, MD;  Location: [place] [facility];  Service: Cardiovascular;  Laterality: N/A;  . CARDIAC CATHETERIZATION Right 01/12/2015   Procedure: Coronary Stent Intervention;  Surgeon: Peter M Martinique, MD;  Location: [place] [facility];  Service: Cardiovascular;  Laterality: Right;  . CORONARY STENT PLACEMENT  01/12/2015   des to [place]  . TUBAL LIGATION       Current Meds  Medication Sig  .  albuterol (PROVENTIL HFA;VENTOLIN HFA) 108 (90 Base) MCG/ACT inhaler Inhale 2 puffs into the lungs every 6 (six) hours as needed for wheezing or shortness of breath.  Marland Kitchen amLODipine (NORVASC) 10 MG tablet TAKE 1 TABLET(10 MG) BY MOUTH DAILY  . aspirin EC 81 MG tablet Take 1 tablet (81 mg total) by mouth daily.  . fenofibrate 160 MG tablet Take 1 tablet (160 mg total) by mouth daily. Please make overdue appt with Dr. Meda Coffee before anymore refills. 2nd attempt  . fluticasone (FLONASE) 50 MCG/ACT nasal spray SHAKE LIQUID AND USE 2 SPRAYS IN EACH NOSTRIL DAILY  . hydrochlorothiazide (HYDRODIURIL) 25 MG tablet TAKE 1 TABLET  BY MOUTH DAILY  . levothyroxine (SYNTHROID) 75 MCG tablet TAKE 1 TABLET BY MOUTH EVERY DAY  . lisinopril (ZESTRIL) 40 MG tablet TAKE 1 TABLET BY MOUTH EVERY DAY  . loratadine (CLARITIN) 10 MG tablet Take 10 mg by mouth daily.  . nitroGLYCERIN (NITROSTAT) 0.4 MG SL tablet PLACE 1 TABLET UNDER TONGUE EVERY 5 MINUTES AS NEEDED FOR CHEST PAIN  . pantoprazole (PROTONIX) 40 MG tablet TAKE 1 TABLET BY MOUTH DAILY AT NOON  . PARoxetine (PAXIL) 10 MG tablet TAKE 1 TABLET(10 MG) BY MOUTH DAILY  . potassium chloride (K-DUR) 10 MEQ tablet Take 10 mEq by mouth once.  . rosuvastatin (CRESTOR) 20 MG tablet Take 1 tablet (20 mg total) by mouth daily.  . Vitamin D, Ergocalciferol, (DRISDOL) 1.25 MG (50000 UT) CAPS capsule Take 1 capsule (50,000 Units total) by mouth every 7 (seven) days.     Allergies:   Propoxyphene n-acetaminophen   Social History   Tobacco Use  . Smoking status: Former Research scientist (life sciences)  . Smokeless tobacco: Never Used  Substance Use Topics  . Alcohol use: Yes    Comment: socially  . Drug use: No     Family Hx: The patient's family history includes Coronary artery disease in her father, mother, and paternal grandmother; Diabetes in her mother; Heart attack in her mother; Heart disease in her son; Hypertension in her father, mother, paternal grandmother, and son. There is no history of Stroke or Cancer.  ROS:   Please see the history of present illness.      All other systems reviewed and are negative.   Prior CV studies:   The following studies were reviewed today:  NST 10/9/2020Study Highlights     Nuclear stress EF: 68%.  The study is normal.  This is a low risk study.  There was no ST segment deviation noted during stress.  No T wave inversion was noted during stress.   Low risk stress nuclear study with normal perfusion and normal left ventricular regional and global systolic function.   2 week monitor 12/18/18 Summary: The patient's monitoring period was 12/18/2018  - 12/31/2018. Baseline sample showed Sinus Bradycardia with a heart rate of 50.1 bpm. There were 0 critical, 0 serious, and 19 stable events that occurred. The report analysis of the critical, serious, stable and manually triggered events are listed below. Manually Detected Events: 2 Stable: Sinus Bradycardia w/Artifact . None or Accidental Push 1 Stable: Sinus Bradycardia w/Artifact/Lead Loss . Flutter or Skipped Beats 1 Stable: Sinus Rhythm w/Artifact/Lead Loss . None or Accidental Push 1 Stable: Sinus Bradycardia, Sinus Rhythm w/Atrial Run/Artifact . Flutter or Skipped Beats 2 Stable: Sinus Bradycardia w/Artifact/Lead Loss . None or Accidental Push Automatically Detected Events: 2 Stable:  Labs/Other Tests and Data Reviewed:    EKG:  2 week monitor reviewed  Recent Labs: 07/04/2018: Pro B Natriuretic  peptide (BNP) 85.0 09/20/2018: ALT 23; BUN 27; Creatinine, Ser 1.01; Hemoglobin 14.3; Platelets 296.0; Potassium 4.3; Sodium 140; TSH 2.25   Recent Lipid Panel Lab Results  Component Value Date/Time   CHOL 157 09/20/2018 12:08 PM   CHOL 139 08/01/2017 07:58 AM   TRIG 125.0 09/20/2018 12:08 PM   HDL 49.10 09/20/2018 12:08 PM   HDL 49 08/01/2017 07:58 AM   CHOLHDL 3 09/20/2018 12:08 PM   LDLCALC 83 09/20/2018 12:08 PM   LDLCALC 75 08/01/2017 07:58 AM   LDLDIRECT 92.9 12/20/2011 09:24 AM    Wt Readings from Last 3 Encounters:  01/16/19 174 lb (78.9 kg)  12/07/18 178 lb (80.7 kg)  12/04/18 178 lb 3.2 oz (80.8 kg)     Objective:    Vital Signs:  Pulse (!) 58   Ht 5\' 5"  (1.651 m)   Wt 174 lb (78.9 kg)   BMI 28.96 kg/m    VITAL SIGNS:  reviewed GEN:  no acute distress RESPIRATORY:  normal respiratory effort, symmetric expansion CARDIOVASCULAR:  no peripheral edema  ASSESSMENT & PLAN:    1. CAD with stent to the mid LAD in 2016 was asymptomatic and picked up on GXT screening.  Recent bradycardia and feeling like she had to work harder therefore [place] done 12/07/2018  and was normal without ischemia. Doing better with cooler weather 2. Sinus bradycardia in the 40s with recent fatigue and extra effort walking up hills 30-day monitor placed and only showed artifact with bradycardia down to 36 while sleeping but mostly 40-50's asymptomatic. HR on fitbit in 50's 3. Essential hypertension BP has been controlled 4. Bilateral carotid artery stenosis RA CA 40 to [id number] LICA 1 to [id number] 5. Hyperlipidemia Crestor increased to 20 mg daily for LDL of 83  COVID-19 Education: The signs and symptoms of COVID-19 were discussed with the patient and how to seek care for testing (follow up with PCP or arrange E-visit).   The importance of social distancing was discussed today.  Time:   Today, I have spent 7:18 minutes with the patient with telehealth technology discussing the above problems.     Medication Adjustments/Labs and Tests Ordered: Current medicines are reviewed at length with the patient today.  Concerns regarding medicines are outlined above.   Tests Ordered: No orders of the defined types were placed in this encounter.   Medication Changes: No orders of the defined types were placed in this encounter.   Follow Up:  Either In Person or Virtual in 6 month(s) Dr. Meda Coffee  Signed, Ermalinda Barrios, PA-C  01/16/2019 9:51 AM    [place]

## 2019-01-15 NOTE — Telephone Encounter (Signed)

## 2019-01-16 ENCOUNTER — Other Ambulatory Visit: Payer: Self-pay

## 2019-01-16 ENCOUNTER — Telehealth (INDEPENDENT_AMBULATORY_CARE_PROVIDER_SITE_OTHER): Payer: Medicare Other | Admitting: Physician Assistant

## 2019-01-16 ENCOUNTER — Encounter: Payer: Self-pay | Admitting: Physician Assistant

## 2019-01-16 VITALS — HR 58 | Ht 65.0 in | Wt 174.0 lb

## 2019-01-16 DIAGNOSIS — I10 Essential (primary) hypertension: Secondary | ICD-10-CM | POA: Diagnosis not present

## 2019-01-16 DIAGNOSIS — R001 Bradycardia, unspecified: Secondary | ICD-10-CM | POA: Diagnosis not present

## 2019-01-16 DIAGNOSIS — E782 Mixed hyperlipidemia: Secondary | ICD-10-CM

## 2019-01-16 DIAGNOSIS — I2510 Atherosclerotic heart disease of native coronary artery without angina pectoris: Secondary | ICD-10-CM | POA: Diagnosis not present

## 2019-01-16 DIAGNOSIS — I6523 Occlusion and stenosis of bilateral carotid arteries: Secondary | ICD-10-CM | POA: Diagnosis not present

## 2019-01-16 NOTE — Patient Instructions (Signed)
Medication Instructions:  Your physician recommends that you continue on your current medications as directed. Please refer to the Current Medication list given to you today.  *If you need a refill on your cardiac medications before your next appointment, please call your pharmacy*  Lab Work: None ordered  If you have labs (blood work) drawn today and your tests are completely normal, you will receive your results only by: Marland Kitchen MyChart Message (if you have MyChart) OR . A paper copy in the mail If you have any lab test that is abnormal or we need to change your treatment, we will call you to review the results.  Testing/Procedures: None ordered  Follow-Up: At [facility], you and your health needs are our priority.  As part of our continuing mission to provide you with exceptional heart care, we have created designated Provider Care Teams.  These Care Teams include your primary Cardiologist (physician) and Advanced Practice Providers (APPs -  Physician Assistants and Nurse Practitioners) who all work together to provide you with the care you need, when you need it.  Your next appointment:   6 month(s)  The format for your next appointment:   Either In Person or Virtual  Provider:   You may see Ena Dawley, MD or one of the following Advanced Practice Providers on your designated Care Team:    Melina Copa, PA-C  Ermalinda Barrios, PA-C   Other Instructions

## 2019-01-31 ENCOUNTER — Other Ambulatory Visit: Payer: Self-pay | Admitting: General Practice

## 2019-01-31 MED ORDER — LEVOTHYROXINE SODIUM 75 MCG PO TABS: 75.0000 ug | ORAL_TABLET | Freq: Every day | ORAL | 6 refills | Status: DC

## 2019-01-31 MED ORDER — LISINOPRIL 40 MG PO TABS: 40.0000 mg | ORAL_TABLET | Freq: Every day | ORAL | 6 refills | Status: DC

## 2019-02-01 ENCOUNTER — Other Ambulatory Visit: Payer: Self-pay | Admitting: Cardiology

## 2019-02-05 ENCOUNTER — Other Ambulatory Visit: Payer: Self-pay | Admitting: Cardiology

## 2019-02-05 DIAGNOSIS — I6523 Occlusion and stenosis of bilateral carotid arteries: Secondary | ICD-10-CM

## 2019-02-05 DIAGNOSIS — E782 Mixed hyperlipidemia: Secondary | ICD-10-CM

## 2019-02-05 DIAGNOSIS — I10 Essential (primary) hypertension: Secondary | ICD-10-CM

## 2019-02-05 DIAGNOSIS — I2510 Atherosclerotic heart disease of native coronary artery without angina pectoris: Secondary | ICD-10-CM

## 2019-02-11 ENCOUNTER — Other Ambulatory Visit: Payer: Self-pay | Admitting: General Practice

## 2019-02-11 MED ORDER — AMLODIPINE BESYLATE 10 MG PO TABS: ORAL_TABLET | ORAL | 0 refills | Status: DC

## 2019-02-17 ENCOUNTER — Other Ambulatory Visit: Payer: Self-pay | Admitting: Physician Assistant

## 2019-02-25 ENCOUNTER — Other Ambulatory Visit: Payer: Self-pay

## 2019-02-25 MED ORDER — PAROXETINE HCL 10 MG PO TABS: ORAL_TABLET | ORAL | 0 refills | Status: DC

## 2019-03-05 ENCOUNTER — Other Ambulatory Visit: Payer: Medicare Other

## 2019-03-05 ENCOUNTER — Other Ambulatory Visit: Payer: Self-pay

## 2019-03-05 DIAGNOSIS — I2510 Atherosclerotic heart disease of native coronary artery without angina pectoris: Secondary | ICD-10-CM | POA: Diagnosis not present

## 2019-03-05 DIAGNOSIS — I10 Essential (primary) hypertension: Secondary | ICD-10-CM | POA: Diagnosis not present

## 2019-03-05 DIAGNOSIS — E782 Mixed hyperlipidemia: Secondary | ICD-10-CM

## 2019-03-05 DIAGNOSIS — R0600 Dyspnea, unspecified: Secondary | ICD-10-CM | POA: Diagnosis not present

## 2019-03-05 DIAGNOSIS — E785 Hyperlipidemia, unspecified: Secondary | ICD-10-CM

## 2019-03-05 DIAGNOSIS — I6523 Occlusion and stenosis of bilateral carotid arteries: Secondary | ICD-10-CM | POA: Diagnosis not present

## 2019-03-05 DIAGNOSIS — R0609 Other forms of dyspnea: Secondary | ICD-10-CM

## 2019-03-05 DIAGNOSIS — R001 Bradycardia, unspecified: Secondary | ICD-10-CM

## 2019-03-05 LAB — LIPID PANEL
Chol/HDL Ratio: 2.8 ratio (ref 0.0–4.4)
Cholesterol, Total: 133 mg/dL (ref 100–199)
HDL: 47 mg/dL (ref >39)
LDL Chol Calc (NIH): 67 mg/dL (ref 0–99)
Triglycerides: 105 mg/dL (ref 0–149)
VLDL Cholesterol Cal: 19 mg/dL (ref 5–40)

## 2019-03-05 LAB — HEPATIC FUNCTION PANEL
ALT: 24 IU/L (ref 0–32)
AST: 24 IU/L (ref 0–40)
Albumin: 4.6 g/dL (ref 3.7–4.7)
Alkaline Phosphatase: 73 IU/L (ref 39–117)
Bilirubin Total: 0.3 mg/dL (ref 0.0–1.2)
Bilirubin, Direct: 0.13 mg/dL (ref 0.00–0.40)
Total Protein: 6.8 g/dL (ref 6.0–8.5)

## 2019-03-26 ENCOUNTER — Ambulatory Visit (INDEPENDENT_AMBULATORY_CARE_PROVIDER_SITE_OTHER): Payer: Medicare Other | Admitting: Family Medicine

## 2019-03-26 ENCOUNTER — Encounter: Payer: Self-pay | Admitting: Family Medicine

## 2019-03-26 ENCOUNTER — Other Ambulatory Visit: Payer: Self-pay

## 2019-03-26 VITALS — BP 136/83 | HR 52 | Temp 98.5°F | Ht 65.0 in | Wt 175.0 lb

## 2019-03-26 DIAGNOSIS — I10 Essential (primary) hypertension: Secondary | ICD-10-CM

## 2019-03-26 DIAGNOSIS — E038 Other specified hypothyroidism: Secondary | ICD-10-CM | POA: Diagnosis not present

## 2019-03-26 DIAGNOSIS — E782 Mixed hyperlipidemia: Secondary | ICD-10-CM | POA: Diagnosis not present

## 2019-03-26 NOTE — Progress Notes (Signed)
I have discussed the procedure for the virtual visit with the patient who has given consent to proceed with assessment and treatment.   Jonnatan Hanners L Govanni Plemons, CMA     

## 2019-03-26 NOTE — Progress Notes (Signed)
Virtual Visit via Video   I connected with patient on 03/26/19 at  8:30 AM EST by a video enabled telemedicine application and verified that I am speaking with the correct person using two identifiers.  Location patient: Home Location provider: Acupuncturist, Office Persons participating in the virtual visit: Patient, Provider, [place] (Jess B)  I discussed the limitations of evaluation and management by telemedicine and the availability of in person appointments. The patient expressed understanding and agreed to proceed.  Subjective:   HPI:   HTN- chronic problem, on Amlodipine 10mg  daily, HCTZ 25mg  daily, Lisinopril 40mg  daily w/ adequate control.  Denies CP, SOB, HAs, visual changes, edema.  Hyperlipidemia- chronic problem, on Fenofibrate 160mg  daily and Crestor 20mg  daily.  Walking daily.  Denies abd pain, N/V.  Hypothyroid- chronic problem, on Levothyroxine 28mcg daily.  Denies excessive fatigue, changes to skin/hair/nails.   ROS:   See pertinent positives and negatives per HPI.  Patient Active Problem List   Diagnosis Date Noted  . Sinus bradycardia 01/15/2019  . Abnormal EKG 03/09/2016  . Essential hypertension 06/22/2015  . Carotid artery disease ([place]) 01/12/2015  . Coronary artery disease involving native coronary artery without angina pectoris 01/12/2015  . Abnormal exercise tolerance test 12/16/2014  . Plantar fasciitis, bilateral 08/20/2013  . 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 Rotator cuff dysfunction 03/09/2012  . Shingles 12/20/2011  . General medical examination 10/13/2010  . BACK PAIN 12/02/2009  . Vitamin D deficiency 09/14/2009  . Hypothyroid 06/10/2009  . Hyperlipidemia 06/10/2009  . ALLERGIC RHINITIS 06/10/2009  . GERD 06/10/2009  . PARESTHESIA 06/10/2009    Social History   Tobacco Use  . Smoking status: Former Research scientist (life sciences)  . Smokeless tobacco: Never Used  Substance Use Topics  . Alcohol use: Yes    Comment: socially    Current  Outpatient Medications:  .  albuterol (PROVENTIL HFA;VENTOLIN HFA) 108 (90 Base) MCG/ACT inhaler, Inhale 2 puffs into the lungs every 6 (six) hours as needed for wheezing or shortness of breath., Disp: 1 Inhaler, Rfl: 0 .  amLODipine (NORVASC) 10 MG tablet, TAKE 1 TABLET(10 MG) BY MOUTH DAILY, Disp: 90 tablet, Rfl: 0 .  aspirin EC 81 MG tablet, Take 1 tablet (81 mg total) by mouth daily., Disp: , Rfl:  .  fenofibrate 160 MG tablet, TAKE 1 TABLET BY MOUTH DAILY, Disp: 90 tablet, Rfl: 3 .  fluticasone (FLONASE) 50 MCG/ACT nasal spray, SHAKE LIQUID AND USE 2 SPRAYS IN EACH NOSTRIL DAILY, Disp: 48 g, Rfl: 0 .  hydrochlorothiazide (HYDRODIURIL) 25 MG tablet, TAKE 1 TABLET BY MOUTH DAILY, Disp: 90 tablet, Rfl: 0 .  levothyroxine (SYNTHROID) 75 MCG tablet, Take 1 tablet (75 mcg total) by mouth daily., Disp: 30 tablet, Rfl: 6 .  lisinopril (ZESTRIL) 40 MG tablet, Take 1 tablet (40 mg total) by mouth daily., Disp: 30 tablet, Rfl: 6 .  loratadine (CLARITIN) 10 MG tablet, Take 10 mg by mouth daily., Disp: , Rfl:  .  nitroGLYCERIN (NITROSTAT) 0.4 MG SL tablet, PLACE 1 TABLET UNDER TONGUE EVERY 5 MINUTES AS NEEDED FOR CHEST PAIN, Disp: 25 tablet, Rfl: 3 .  pantoprazole (PROTONIX) 40 MG tablet, TAKE 1 TABLET BY MOUTH DAILY AT NOON, Disp: 90 tablet, Rfl: 0 .  PARoxetine (PAXIL) 10 MG tablet, TAKE 1 TABLET(10 MG) BY MOUTH DAILY, Disp: 90 tablet, Rfl: 0 .  potassium chloride (K-DUR) 10 MEQ tablet, Take 10 mEq by mouth once., Disp: , Rfl:  .  rosuvastatin (CRESTOR) 20 MG tablet, Take 1 tablet (  20 mg total) by mouth daily., Disp: 90 tablet, Rfl: 3  Allergies  Allergen Reactions  . Propoxyphene N-Acetaminophen Other (See Comments)    Made sick    Objective:   BP 136/83   Pulse (!) 52   Temp 98.5 F (36.9 C) (Oral)   Ht 5\' 5"  (1.651 m)   Wt 175 lb (79.4 kg)   BMI 29.12 kg/m  AAOx3, NAD NCAT, EOMI No obvious CN deficits Coloring WNL Pt is able to speak clearly, coherently without shortness of breath or  increased work of breathing.  Thought process is linear.  Mood is appropriate.   Assessment and Plan:   HTN- chronic problem.  Adequate control.  Asymptomatic.  Check labs.  No anticipated med changes.  Will follow.  Hyperlipidemia- chronic problem.  Tolerating statin w/o difficulty.  Now walking daily.  Applauded her efforts.  Check labs.  Adjust meds prn   Hypothyroid- chronic problem, currently asymptomatic  Check labs.  Adjust meds prn    Annye Asa, MD 03/26/2019

## 2019-04-01 ENCOUNTER — Other Ambulatory Visit: Payer: Self-pay | Admitting: General Practice

## 2019-04-01 MED ORDER — PANTOPRAZOLE SODIUM 40 MG PO TBEC: DELAYED_RELEASE_TABLET | ORAL | 0 refills | Status: DC

## 2019-04-02 ENCOUNTER — Other Ambulatory Visit: Payer: Self-pay

## 2019-04-02 ENCOUNTER — Ambulatory Visit (INDEPENDENT_AMBULATORY_CARE_PROVIDER_SITE_OTHER): Payer: Medicare Other

## 2019-04-02 DIAGNOSIS — I10 Essential (primary) hypertension: Secondary | ICD-10-CM | POA: Diagnosis not present

## 2019-04-02 DIAGNOSIS — E038 Other specified hypothyroidism: Secondary | ICD-10-CM | POA: Diagnosis not present

## 2019-04-02 LAB — CBC WITH DIFFERENTIAL/PLATELET
Basophils Absolute: 0.1 10*3/uL (ref 0.0–0.1)
Basophils Relative: 0.8 % (ref 0.0–3.0)
Eosinophils Absolute: 0.1 10*3/uL (ref 0.0–0.7)
Eosinophils Relative: 1.7 % (ref 0.0–5.0)
HCT: 41.2 % (ref 36.0–46.0)
Hemoglobin: 13.7 g/dL (ref 12.0–15.0)
Lymphocytes Relative: 35.5 % (ref 12.0–46.0)
Lymphs Abs: 3.1 10*3/uL (ref 0.7–4.0)
MCHC: 33.2 g/dL (ref 30.0–36.0)
MCV: 90.4 fl (ref 78.0–100.0)
Monocytes Absolute: 0.9 10*3/uL (ref 0.1–1.0)
Monocytes Relative: 9.9 % (ref 3.0–12.0)
Neutro Abs: 4.5 10*3/uL (ref 1.4–7.7)
Neutrophils Relative %: 52.1 % (ref 43.0–77.0)
Platelets: 280 10*3/uL (ref 150.0–400.0)
RBC: 4.55 Mil/uL (ref 3.87–5.11)
RDW: 13.4 % (ref 11.5–15.5)
WBC: 8.6 10*3/uL (ref 4.0–10.5)

## 2019-04-02 LAB — BASIC METABOLIC PANEL
BUN: 22 mg/dL (ref 6–23)
CO2: 29 mEq/L (ref 19–32)
Calcium: 10.1 mg/dL (ref 8.4–10.5)
Chloride: 104 mEq/L (ref 96–112)
Creatinine, Ser: 0.95 mg/dL (ref 0.40–1.20)
GFR: 57.45 mL/min — ABNORMAL LOW (ref >60.00)
Glucose, Bld: 112 mg/dL — ABNORMAL HIGH (ref 70–99)
Potassium: 3.9 mEq/L (ref 3.5–5.1)
Sodium: 141 mEq/L (ref 135–145)

## 2019-04-02 LAB — TSH: TSH: 4.72 u[IU]/mL — ABNORMAL HIGH (ref 0.35–4.50)

## 2019-04-03 ENCOUNTER — Other Ambulatory Visit: Payer: Self-pay | Admitting: General Practice

## 2019-04-03 DIAGNOSIS — E038 Other specified hypothyroidism: Secondary | ICD-10-CM

## 2019-04-03 MED ORDER — LEVOTHYROXINE SODIUM 88 MCG PO TABS: 88.0000 ug | ORAL_TABLET | Freq: Every day | ORAL | 3 refills | Status: DC

## 2019-04-13 ENCOUNTER — Encounter: Payer: Self-pay | Admitting: Family Medicine

## 2019-04-30 ENCOUNTER — Other Ambulatory Visit (HOSPITAL_COMMUNITY): Payer: Self-pay | Admitting: Cardiology

## 2019-04-30 ENCOUNTER — Ambulatory Visit (HOSPITAL_COMMUNITY)
Admission: RE | Admit: 2019-04-30 | Discharge: 2019-04-30 | Disposition: A | Discharge status: Home / Self Care | Payer: Medicare Other | Source: Ambulatory Visit | Attending: Cardiology | Admitting: Cardiology

## 2019-04-30 ENCOUNTER — Other Ambulatory Visit: Payer: Self-pay

## 2019-04-30 DIAGNOSIS — I6523 Occlusion and stenosis of bilateral carotid arteries: Secondary | ICD-10-CM | POA: Diagnosis not present

## 2019-05-03 ENCOUNTER — Telehealth: Payer: Self-pay | Admitting: *Deleted

## 2019-05-03 DIAGNOSIS — E785 Hyperlipidemia, unspecified: Secondary | ICD-10-CM

## 2019-05-03 DIAGNOSIS — I6523 Occlusion and stenosis of bilateral carotid arteries: Secondary | ICD-10-CM

## 2019-05-03 NOTE — Telephone Encounter (Signed)
-----   Message from Dorothy Spark, MD sent at 05/01/2019  2:38 PM EST ----- Right Carotid: Velocities in the right ICA are consistent with a 40-59%                Stenosis.  Left Carotid: Velocities in the left ICA are consistent with a 1-39% stenosis.                 No intervention is needed, we will repeat in a year.

## 2019-05-03 NOTE — Telephone Encounter (Signed)
Notified the pt of her carotid artery results and recommendations per Dr. Meda Coffee, that no intervention is needed at this time, and we will repeat carotids in one year.  Informed the pt that I will go ahead and place the order for the repeat carotids in one year in the system and send a message to our PV scheduler to call her back and arrange this appt when appropriate to schedule. Pt verbalized understanding and agrees with this plan.

## 2019-05-06 ENCOUNTER — Other Ambulatory Visit: Payer: Self-pay | Admitting: Family Medicine

## 2019-05-06 DIAGNOSIS — Z1231 Encounter for screening mammogram for malignant neoplasm of breast: Secondary | ICD-10-CM

## 2019-05-09 ENCOUNTER — Ambulatory Visit
Admission: RE | Admit: 2019-05-09 | Discharge: 2019-05-09 | Disposition: A | Discharge status: Home / Self Care | Payer: Medicare Other | Source: Ambulatory Visit

## 2019-05-09 ENCOUNTER — Other Ambulatory Visit: Payer: Self-pay

## 2019-05-09 DIAGNOSIS — Z1231 Encounter for screening mammogram for malignant neoplasm of breast: Secondary | ICD-10-CM | POA: Diagnosis not present

## 2019-05-10 ENCOUNTER — Other Ambulatory Visit: Payer: Self-pay | Admitting: General Practice

## 2019-05-10 MED ORDER — AMLODIPINE BESYLATE 10 MG PO TABS: ORAL_TABLET | ORAL | 0 refills | Status: DC

## 2019-05-16 ENCOUNTER — Other Ambulatory Visit: Payer: Self-pay | Admitting: Physician Assistant

## 2019-05-21 ENCOUNTER — Other Ambulatory Visit: Payer: Self-pay | Admitting: General Practice

## 2019-05-21 MED ORDER — PAROXETINE HCL 10 MG PO TABS: ORAL_TABLET | ORAL | 0 refills | Status: DC

## 2019-05-28 DIAGNOSIS — H2513 Age-related nuclear cataract, bilateral: Secondary | ICD-10-CM | POA: Diagnosis not present

## 2019-06-04 ENCOUNTER — Telehealth: Payer: Self-pay | Admitting: Family Medicine

## 2019-06-04 NOTE — Progress Notes (Signed)
  Chronic Care Management   Outreach Note  06/04/2019 Name: Ann Wagner MRN: [id number] DOB: Jun 27, 1945  Referred by: Midge Minium, MD Reason for referral : No chief complaint on file.   An unsuccessful telephone outreach was attempted today. The patient was referred to the pharmacist for assistance with care management and care coordination.   Follow Up Plan:   Earney Hamburg Upstream Scheduler

## 2019-06-07 ENCOUNTER — Telehealth: Payer: Self-pay | Admitting: Family Medicine

## 2019-06-07 NOTE — Progress Notes (Signed)
  Chronic Care Management   Outreach Note  06/07/2019 Name: Ann Wagner MRN: [id number] DOB: [birth year]  Referred by: Midge Minium, MD Reason for referral : No chief complaint on file.   A second unsuccessful telephone outreach was attempted today. The patient was referred to pharmacist for assistance with care management and care coordination.  Follow Up Plan:   Earney Hamburg Upstream Scheduler

## 2019-06-07 NOTE — Progress Notes (Signed)
  Chronic Care Management   Note  06/07/2019 Name: Ann Wagner MRN: [id number] DOB: 1945/03/24  Ann Wagner is a 74 y.o. year old female who is a primary care patient of Birdie Riddle, Aundra Millet, MD. I reached out to Ann Wagner by phone today in response to a referral sent by Ms. Jeanell Sparrow PCP, Midge Minium, MD.   Ms. Wheelus was given information about Chronic Care Management services today including:  1. CCM service includes personalized support from designated clinical staff supervised by her physician, including individualized plan of care and coordination with other care providers 2. 24/7 contact phone numbers for assistance for urgent and routine care needs. 3. Service will only be billed when office clinical staff spend 20 minutes or more in a month to coordinate care. 4. Only one practitioner may furnish and bill the service in a calendar month. 5. The patient may stop CCM services at any time (effective at the end of the month) by phone call to the office staff.   Patient agreed to services and verbal consent obtained.   Follow up plan:   Earney Hamburg Upstream Scheduler

## 2019-06-18 ENCOUNTER — Other Ambulatory Visit: Payer: Self-pay | Admitting: General Practice

## 2019-06-18 DIAGNOSIS — I10 Essential (primary) hypertension: Secondary | ICD-10-CM

## 2019-06-18 DIAGNOSIS — E038 Other specified hypothyroidism: Secondary | ICD-10-CM

## 2019-06-18 DIAGNOSIS — E782 Mixed hyperlipidemia: Secondary | ICD-10-CM

## 2019-06-26 ENCOUNTER — Ambulatory Visit (INDEPENDENT_AMBULATORY_CARE_PROVIDER_SITE_OTHER): Payer: Medicare Other

## 2019-06-26 ENCOUNTER — Telehealth: Payer: Self-pay

## 2019-06-26 DIAGNOSIS — Z0000 Encounter for general adult medical examination without abnormal findings: Secondary | ICD-10-CM

## 2019-06-26 NOTE — Patient Instructions (Signed)
Ann Wagner , Thank you for taking time to come for your Medicare Wellness Visit. I appreciate your ongoing commitment to your health goals. Please review the following plan we discussed and let me know if I can assist you in the future.   Screening recommendations/referrals: Colorectal Screening: up to date; last colonoscopy 08/2009 (repeat due July 2021) Mammogram: up to date; last 05/09/19 Bone Density: up to date; last 04/27/17   Vision and Dental Exams: Recommended annual ophthalmology exams for early detection of glaucoma and other disorders of the eye Recommended annual dental exams for proper oral hygiene  Vaccinations: Influenza vaccine: completed 11/09/18 Pneumococcal vaccine: up to date; last 04/05/17 Tdap vaccine: up to date last 08/23/16  Shingles vaccine:  You may receive this vaccine at your local pharmacy. (see handout)  Covid vaccine: Completed   Advanced directives: Please bring a copy of your POA (Power of Attorney) and/or Living Will to your next appointment.  Goals: Recommend to drink at least 6-8 8oz glasses of water per day and consume a balanced diet rich in fresh fruits and vegetables.   Next appointment: Please schedule your Annual Wellness Visit with your Nurse Health Advisor in one year.  Preventive Care 75 Years and Older, Female Preventive care refers to lifestyle choices and visits with your health care provider that can promote health and wellness. What does preventive care include?  A yearly physical exam. This is also called an annual well check.  Dental exams once or twice a year.  Routine eye exams. Ask your health care provider how often you should have your eyes checked.  Personal lifestyle choices, including:  Daily care of your teeth and gums.  Regular physical activity.  Eating a healthy diet.  Avoiding tobacco and drug use.  Limiting alcohol use.  Practicing safe sex.  Taking low-dose aspirin every day if recommended by your health  care provider.  Taking vitamin and mineral supplements as recommended by your health care provider. What happens during an annual well check? The services and screenings done by your health care provider during your annual well check will depend on your age, overall health, lifestyle risk factors, and family history of disease. Counseling  Your health care provider may ask you questions about your:  Alcohol use.  Tobacco use.  Drug use.  Emotional well-being.  Home and relationship well-being.  Sexual activity.  Eating habits.  History of falls.  Memory and ability to understand (cognition).  Work and work Statistician.  Reproductive health. Screening  You may have the following tests or measurements:  Height, weight, and BMI.  Blood pressure.  Lipid and cholesterol levels. These may be checked every 5 years, or more frequently if you are over 75 years old.  Skin check.  Lung cancer screening. You may have this screening every year starting at age 75 if you have a 30-pack-year history of smoking and currently smoke or have quit within the past 15 years.  Fecal occult blood test (FOBT) of the stool. You may have this test every year starting at age 75.  Flexible sigmoidoscopy or colonoscopy. You may have a sigmoidoscopy every 5 years or a colonoscopy every 10 years starting at age 75.  Hepatitis C blood test.  Hepatitis B blood test.  Sexually transmitted disease (STD) testing.  Diabetes screening. This is done by checking your blood sugar (glucose) after you have not eaten for a while (fasting). You may have this done every 1-3 years.  Bone density scan. This is done  to screen for osteoporosis. You may have this done starting at age 75.  Mammogram. This may be done every 1-2 years. Talk to your health care provider about how often you should have regular mammograms. Talk with your health care provider about your test results, treatment options, and if  necessary, the need for more tests. Vaccines  Your health care provider may recommend certain vaccines, such as:  Influenza vaccine. This is recommended every year.  Tetanus, diphtheria, and acellular pertussis (Tdap, Td) vaccine. You may need a Td booster every 10 years.  Zoster vaccine. You may need this after age 75.  Pneumococcal 13-valent conjugate (PCV13) vaccine. One dose is recommended after age 75.  Pneumococcal polysaccharide (PPSV23) vaccine. One dose is recommended after age 77. Talk to your health care provider about which screenings and vaccines you need and how often you need them. This information is not intended to replace advice given to you by your health care provider. Make sure you discuss any questions you have with your health care provider. Document Released: 03/13/2015 Document Revised: 11/04/2015 Document Reviewed: 12/16/2014 Elsevier Interactive Patient Education  2017 [place] Prevention in the Home Falls can cause injuries. They can happen to people of all ages. There are many things you can do to make your home safe and to help prevent falls. What can I do on the outside of my home?  Regularly fix the edges of walkways and driveways and fix any cracks.  Remove anything that might make you trip as you walk through a door, such as a raised step or threshold.  Trim any bushes or trees on the path to your home.  Use bright outdoor lighting.  Clear any walking paths of anything that might make someone trip, such as rocks or tools.  Regularly check to see if handrails are loose or broken. Make sure that both sides of any steps have handrails.  Any raised decks and porches should have guardrails on the edges.  Have any leaves, snow, or ice cleared regularly.  Use sand or salt on walking paths during winter.  Clean up any spills in your garage right away. This includes oil or grease spills. What can I do in the bathroom?  Use night  lights.  Install grab bars by the toilet and in the tub and shower. Do not use towel bars as grab bars.  Use non-skid mats or decals in the tub or shower.  If you need to sit down in the shower, use a plastic, non-slip stool.  Keep the floor dry. Clean up any water that spills on the floor as soon as it happens.  Remove soap buildup in the tub or shower regularly.  Attach bath mats securely with double-sided non-slip rug tape.  Do not have throw rugs and other things on the floor that can make you trip. What can I do in the bedroom?  Use night lights.  Make sure that you have a light by your bed that is easy to reach.  Do not use any sheets or blankets that are too big for your bed. They should not hang down onto the floor.  Have a firm chair that has side arms. You can use this for support while you get dressed.  Do not have throw rugs and other things on the floor that can make you trip. What can I do in the kitchen?  Clean up any spills right away.  Avoid walking on wet floors.  Keep items  that you use a lot in easy-to-reach places.  If you need to reach something above you, use a strong step stool that has a grab bar.  Keep electrical cords out of the way.  Do not use floor polish or wax that makes floors slippery. If you must use wax, use non-skid floor wax.  Do not have throw rugs and other things on the floor that can make you trip. What can I do with my stairs?  Do not leave any items on the stairs.  Make sure that there are handrails on both sides of the stairs and use them. Fix handrails that are broken or loose. Make sure that handrails are as long as the stairways.  Check any carpeting to make sure that it is firmly attached to the stairs. Fix any carpet that is loose or worn.  Avoid having throw rugs at the top or bottom of the stairs. If you do have throw rugs, attach them to the floor with carpet tape.  Make sure that you have a light switch at the  top of the stairs and the bottom of the stairs. If you do not have them, ask someone to add them for you. What else can I do to help prevent falls?  Wear shoes that:  Do not have high heels.  Have rubber bottoms.  Are comfortable and fit you well.  Are closed at the toe. Do not wear sandals.  If you use a stepladder:  Make sure that it is fully opened. Do not climb a closed stepladder.  Make sure that both sides of the stepladder are locked into place.  Ask someone to hold it for you, if possible.  Clearly mark and make sure that you can see:  Any grab bars or handrails.  First and last steps.  Where the edge of each step is.  Use tools that help you move around (mobility aids) if they are needed. These include:  Canes.  Walkers.  Scooters.  Crutches.  Turn on the lights when you go into a dark area. Replace any light bulbs as soon as they burn out.  Set up your furniture so you have a clear path. Avoid moving your furniture around.  If any of your floors are uneven, fix them.  If there are any pets around you, be aware of where they are.  Review your medicines with your doctor. Some medicines can make you feel dizzy. This can increase your chance of falling. Ask your doctor what other things that you can do to help prevent falls. This information is not intended to replace advice given to you by your health care provider. Make sure you discuss any questions you have with your health care provider. Document Released: 12/11/2008 Document Revised: 07/23/2015 Document Reviewed: 03/21/2014 Elsevier Interactive Patient Education  2017 [employer].

## 2019-06-26 NOTE — Telephone Encounter (Signed)
Error

## 2019-06-26 NOTE — Progress Notes (Signed)
This visit is being conducted via phone call due to the COVID-19 pandemic. This patient has given me verbal consent via phone to conduct this visit, patient states they are participating from their home address. Some vital signs may be absent or patient reported.   Patient identification: identified by name, DOB, and current address.  Location provider: [facility], Office Persons participating in the virtual visit: Denman George LPN, patient, and Dr. Annye Asa    Subjective:   Ann Wagner is a 74 y.o. female who presents for Medicare Annual (Subsequent) preventive examination.  Review of Systems:   Cardiac Risk Factors include: advanced age (>63men, >58 women);dyslipidemia;hypertension    Objective:     Vitals: There were no vitals taken for this visit.  There is no height or weight on file to calculate BMI.  Advanced Directives 06/26/2019 04/18/2018 04/05/2017 12/16/2015 01/12/2015  Does Patient Have a Medical Advance Directive? Yes Yes Yes Yes No;Yes  Type of Advance Directive Living will;Healthcare Power of [place];Living will Living will;Healthcare Power of [place]  Does patient want to make changes to medical advance directive? No - Patient declined - - No - Patient declined No - Patient declined  Copy of [place] in Chart? No - copy requested No - copy requested No - copy requested No - copy requested No - copy requested    Tobacco Social History   Tobacco Use  Smoking Status Former Smoker  Smokeless Tobacco Never Used     Counseling given: Not Answered   Clinical Intake:  Pre-visit preparation completed: Yes  Pain : No/denies pain  Diabetes: No  How often do you need to have someone help you when you read instructions, pamphlets, or other written materials from your doctor or pharmacy?: 1 - Never  Interpreter Needed?: No  Information entered by  :: Denman George LPN  Past Medical History:  Diagnosis Date  . Allergic rhinitis   . Arthritis   . Coronary artery disease    cath 01/12/2015 single vessel CAD, DES to mid LAD  . GERD (gastroesophageal reflux disease)   . Headache   . History of chicken pox   . Hyperlipidemia   . Hypertension   . Hyperthyroidism   . Syncope    Past Surgical History:  Procedure Laterality Date  . CARDIAC CATHETERIZATION N/A 01/12/2015   Procedure: Left Heart Cath and Coronary Angiography;  Surgeon: Peter M Martinique, MD;  Location: [facility];  Service: Cardiovascular;  Laterality: N/A;  . CARDIAC CATHETERIZATION Right 01/12/2015   Procedure: Coronary Stent Intervention;  Surgeon: Peter M Martinique, MD;  Location: [facility];  Service: Cardiovascular;  Laterality: Right;  . CORONARY STENT PLACEMENT  01/12/2015   des to [place]  . TUBAL LIGATION     Family History  Problem Relation Age of Onset  . Coronary artery disease Mother   . Hypertension Mother   . Heart attack Mother   . Diabetes Mother   . Coronary artery disease Father   . Hypertension Father   . Coronary artery disease Paternal Grandmother   . Hypertension Paternal Grandmother   . Heart disease Son   . Hypertension Son   . Stroke Neg Hx   . Cancer Neg Hx    Social History   Socioeconomic History  . Marital status: Divorced    Spouse name: Not on file  . Number of children: Not on file  .  Years of education: Not on file  . Highest education level: Not on file  Occupational History  . Not on file  Tobacco Use  . Smoking status: Former Research scientist (life sciences)  . Smokeless tobacco: Never Used  Substance and Sexual Activity  . Alcohol use: Yes    Comment: socially  . Drug use: No  . Sexual activity: Not on file  Other Topics Concern  . Not on file  Social History Narrative   Lives alone.   2 sons - 1 locally with 5 kids.   Retired Nurse, learning disability, enjoys antique shows.   Social Determinants of Health   Financial  Resource Strain:   . Difficulty of Paying Living Expenses:   Food Insecurity:   . Worried About Charity fundraiser in the Last Year:   . Arboriculturist in the Last Year:   Transportation Needs:   . Film/video editor (Medical):   Marland Kitchen Lack of Transportation (Non-Medical):   Physical Activity:   . Days of Exercise per Week:   . Minutes of Exercise per Session:   Stress:   . Feeling of Stress :   Social Connections:   . Frequency of Communication with Friends and Family:   . Frequency of Social Gatherings with Friends and Family:   . Attends Religious Services:   . Active Member of Clubs or Organizations:   . Attends Archivist Meetings:   Marland Kitchen Marital Status:     Outpatient Encounter Medications as of 06/26/2019  Medication Sig  . albuterol (PROVENTIL HFA;VENTOLIN HFA) 108 (90 Base) MCG/ACT inhaler Inhale 2 puffs into the lungs every 6 (six) hours as needed for wheezing or shortness of breath.  Marland Kitchen amLODipine (NORVASC) 10 MG tablet TAKE 1 TABLET(10 MG) BY MOUTH DAILY  . aspirin EC 81 MG tablet Take 1 tablet (81 mg total) by mouth daily.  . fenofibrate 160 MG tablet TAKE 1 TABLET BY MOUTH DAILY  . fluticasone (FLONASE) 50 MCG/ACT nasal spray SHAKE LIQUID AND USE 2 SPRAYS IN EACH NOSTRIL DAILY  . hydrochlorothiazide (HYDRODIURIL) 25 MG tablet TAKE 1 TABLET BY MOUTH DAILY  . levothyroxine (SYNTHROID) 88 MCG tablet Take 1 tablet (88 mcg total) by mouth daily.  Marland Kitchen lisinopril (ZESTRIL) 40 MG tablet Take 1 tablet (40 mg total) by mouth daily.  Marland Kitchen loratadine (CLARITIN) 10 MG tablet Take 10 mg by mouth daily.  . nitroGLYCERIN (NITROSTAT) 0.4 MG SL tablet PLACE 1 TABLET UNDER TONGUE EVERY 5 MINUTES AS NEEDED FOR CHEST PAIN  . pantoprazole (PROTONIX) 40 MG tablet TAKE 1 TABLET BY MOUTH DAILY AT NOON  . PARoxetine (PAXIL) 10 MG tablet TAKE 1 TABLET(10 MG) BY MOUTH DAILY  . potassium chloride (K-DUR) 10 MEQ tablet Take 10 mEq by mouth once.  . rosuvastatin (CRESTOR) 20 MG tablet Take 1  tablet (20 mg total) by mouth daily.   No facility-administered encounter medications on file as of 06/26/2019.    Activities of Daily Living In your present state of health, do you have any difficulty performing the following activities: 06/26/2019 03/26/2019  Hearing? N N  Vision? N N  Difficulty concentrating or making decisions? N N  Walking or climbing stairs? N N  Dressing or bathing? N N  Doing errands, shopping? N N  Preparing Food and eating ? N -  Using the Toilet? N -  In the past six months, have you accidently leaked urine? N -  Do you have problems with loss of bowel control? N -  Managing  your Medications? N -  Managing your Finances? N -  Housekeeping or managing your Housekeeping? N -  Some recent data might be hidden    Patient Care Team: Midge Minium, MD as PCP - General Dorothy Spark, MD as PCP - Cardiology (Cardiology) Dorothy Spark, MD as Consulting Physician (Cardiology) Madelin Rear, [facility] as Pharmacist (Pharmacist) Marica Otter, OD as Consulting Physician (Optometry)    Assessment:   This is a routine wellness examination for Kylee.  Exercise Activities and Dietary recommendations Current Exercise Habits: Home exercise routine, Type of exercise: walking, Time (Minutes): 30, Frequency (Times/Week): 3, Weekly Exercise (Minutes/Week): 90, Intensity: Mild  Goals    . Weight (lb) < 165 lb (74.8 kg)     Patient would like to lose weight. Plans to exercise more, walking more frequently. May participate in silver sneakers at [facility].     . Weight (lb) < 165 lb (74.8 kg)     Lose weight by increasing activity.     . Weight (lb) < 165 lb (74.8 kg)     Lose weight by increasing activity and decreasing caloric intake.        Fall Risk Fall Risk  06/26/2019 03/26/2019 09/20/2018 07/05/2018 04/18/2018  Falls in the past year? 0 0 0 0 0  Comment - - - - -  Number falls in past yr: 0 0 0 0 -  Injury with Fall? 0 0 0 0 -  Follow up Falls evaluation  completed;Education provided;Falls prevention discussed Falls evaluation completed - - -   Is the patient's home free of loose throw rugs in walkways, pet beds, electrical cords, etc?   yes      Grab bars in the bathroom? yes      Handrails on the stairs?   yes      Adequate lighting?   yes  Depression Screen PHQ 2/9 Scores 06/26/2019 03/26/2019 09/20/2018 07/05/2018  PHQ - 2 Score 0 0 0 0  PHQ- 9 Score - 0 0 -     Cognitive Function MMSE - Mini Mental State Exam 04/18/2018  Orientation to time 5  Orientation to Place 5  Registration 3  Attention/ Calculation 5  Recall 2  Language- name 2 objects 2  Language- repeat 1  Language- follow 3 step command 3  Language- read & follow direction 1  Write a sentence 1  Copy design 1  Total score 29     6CIT Screen 06/26/2019  What Year? 0 points  What month? 0 points  What time? 0 points  Count back from 20 0 points  Months in reverse 0 points  Repeat phrase 0 points  Total Score 0    Immunization History  Administered Date(s) Administered  . Influenza, High Dose Seasonal PF 11/20/2017, 11/09/2018  . Influenza,inj,Quad PF,6+ Mos 11/29/2014  . Influenza-Unspecified 12/29/2012, 10/29/2013  . PFIZER SARS-COV-2 Vaccination 04/10/2019, 05/01/2019  . Pneumococcal Conjugate-13 04/30/2014  . Pneumococcal Polysaccharide-23 04/05/2017  . Td 08/23/2016  . Zoster 02/28/2010    Qualifies for Shingles Vaccine?Discussed and patient will check with pharmacy for coverage.  Patient education handout provided   Screening Tests Health Maintenance  Topic Date Due  . Hepatitis C Screening  07/05/2019 (Originally May 08, 1945)  . COLONOSCOPY  08/29/2019  . INFLUENZA VACCINE  09/29/2019  . MAMMOGRAM  05/08/2021  . TETANUS/TDAP  08/24/2026  . DEXA SCAN  Completed  . COVID-19 Vaccine  Completed  . PNA vac Low Risk Adult  Completed    Cancer Screenings:  Lung: Low Dose CT Chest recommended if Age 16-80 years, 30 pack-year currently smoking OR have  quit w/in 15years. Patient does not qualify. Breast:  Up to date on Mammogram? Yes   Up to date of Bone Density/Dexa? Yes Colorectal: colonoscopy 08/2009 (patient aware that repeat due 08/2019)   Plan:  I have personally reviewed and addressed the Medicare Annual Wellness questionnaire and have noted the following in the patient's chart:  A. Medical and social history B. Use of alcohol, tobacco or illicit drugs  C. Current medications and supplements D. Functional ability and status E.  Nutritional status F.  Physical activity G. Advance directives H. List of other physicians I.  Hospitalizations, surgeries, and ER visits in previous 12 months J.  [place] such as hearing and vision if needed, cognitive and depression L. Referrals, records requested, and appointments- none   In addition, I have reviewed and discussed with patient certain preventive protocols, quality metrics, and best practice recommendations. A written personalized care plan for preventive services as well as general preventive health recommendations were provided to patient.   Signed,  Denman George, LPN  Nurse Health Advisor   Nurse Notes: no additional

## 2019-06-27 ENCOUNTER — Other Ambulatory Visit: Payer: Self-pay | Admitting: Emergency Medicine

## 2019-06-27 DIAGNOSIS — H40023 Open angle with borderline findings, high risk, bilateral: Secondary | ICD-10-CM | POA: Diagnosis not present

## 2019-06-27 DIAGNOSIS — K219 Gastro-esophageal reflux disease without esophagitis: Secondary | ICD-10-CM

## 2019-06-27 MED ORDER — PANTOPRAZOLE SODIUM 40 MG PO TBEC: DELAYED_RELEASE_TABLET | ORAL | 0 refills | Status: DC

## 2019-06-28 ENCOUNTER — Ambulatory Visit: Payer: Medicare Other

## 2019-06-28 DIAGNOSIS — I10 Essential (primary) hypertension: Secondary | ICD-10-CM

## 2019-06-28 DIAGNOSIS — K219 Gastro-esophageal reflux disease without esophagitis: Secondary | ICD-10-CM

## 2019-06-28 DIAGNOSIS — E782 Mixed hyperlipidemia: Secondary | ICD-10-CM

## 2019-06-28 NOTE — Patient Instructions (Addendum)
Visit Information  Goals Addressed            This Visit's Progress   . BP <130/90       CARE PLAN ENTRY (see longitudinal plan of care for additional care plan information)  Current Barriers:  . Current antihypertensive regimen:   Amlodipine 10 mg daily   HCTZ 25 mg daily  Lisinopril 40 mg daily   Potassium 10 meq daily . Last practice recorded BP readings:  BP Readings from Last 3 Encounters:  03/26/19 136/83  12/04/18 (!) 142/68  09/20/18 128/78   Pharmacist Clinical Goal(s):  Marland Kitchen Over the next 180 days, patient will work with PharmD and providers to optimize antihypertensive regimen.  Interventions: . Inter-disciplinary care team collaboration (see longitudinal plan of care). . Comprehensive medication review performed; medication list updated in the electronic medical record.   Patient Self Care Activities:  . Consider weekly BP monitoring at home - document, and provide at future appointments. . Patient will focus on medication adherence by continuing current medication management.   Initial goal documentation.    Marland Kitchen GERD: Minimize recurring symptoms       CARE PLAN ENTRY  Current Barriers:  . Chronic Disease Management support, education, and care coordination needs related to Gastroesophageal Reflux Disease.  Pharmacist Clinical Goal(s):  Marland Kitchen Over next 180 days, minimize recurring symptoms of acid reflux.   Interventions: . Comprehensive medication review performed.  Patient Self Care Activities:  . Continue Protonix and continue to avoid triggers such as citrus juices and tomato sauce.  Initial goal documentation.    Marland Kitchen LDL <70       CARE PLAN ENTRY (see longitudinal plan of care for additional care plan information)  Current Barriers:  . Current antihyperlipidemic regimen: fenofibrate 160 mg daily, rosuvastatin 20 mg  . Most recent lipid panel:     Component Value Date/Time   CHOL 133 03/05/2019 0809   TRIG 105 03/05/2019 0809   HDL 47  03/05/2019 0809   CHOLHDL 2.8 03/05/2019 0809   CHOLHDL 3 09/20/2018 1208   VLDL 25.0 09/20/2018 1208   LDLCALC 67 03/05/2019 0809   LDLDIRECT 92.9 12/20/2011 0924   . ASCVD risk enhancing conditions: age >8, HTN  Pharmacist Clinical Goal(s):  Marland Kitchen Over the next 180 days, patient will work with PharmD and providers towards optimized antihyperlipidemic therapy  Interventions: . Comprehensive medication review performed; medication list updated in electronic medical record.  Bertram Savin care team collaboration (see longitudinal plan of care)  Patient Self Care Activities:  . Patient will focus on medication adherence by continuing current medication management  Initial goal documentation.     Ms. Schau was given information about Chronic Care Management services today including:  1. CCM service includes personalized support from designated clinical staff supervised by her physician, including individualized plan of care and coordination with other care providers 2. 24/7 contact phone numbers for assistance for urgent and routine care needs. 3. Standard insurance, coinsurance, copays and deductibles apply for chronic care management only during months in which we provide at least 20 minutes of these services. Most insurances cover these services at 100%, however patients may be responsible for any copay, coinsurance and/or deductible if applicable. This service may help you avoid the need for more expensive face-to-face services. 4. Only one practitioner may furnish and bill the service in a calendar month. 5. The patient may stop CCM services at any time (effective at the end of the month) by phone call to the office  staff.  Patient agreed to services and verbal consent obtained.   The patient verbalized understanding of instructions provided today and agreed to receive a mailed copy of patient instruction and/or educational materials. Telephone follow up appointment with  pharmacy team member scheduled for: See next appointment with "Care Management Staff" under "What's Next" below.   Thank you!  Ann Wagner, Pharm.D. Clinical Pharmacist [facility] [phone]  High Cholesterol  High cholesterol is a condition in which the blood has high levels of a white, waxy, fat-like substance (cholesterol). The human body needs small amounts of cholesterol. The liver makes all the cholesterol that the body needs. Extra (excess) cholesterol comes from the food that we eat. Cholesterol is carried from the liver by the blood through the blood vessels. If you have high cholesterol, deposits (plaques) may build up on the walls of your blood vessels (arteries). Plaques make the arteries narrower and stiffer. Cholesterol plaques increase your risk for heart attack and stroke. Work with your health care provider to keep your cholesterol levels in a healthy range. What increases the risk? This condition is more likely to develop in people who:  Eat foods that are high in animal fat (saturated fat) or cholesterol.  Are overweight.  Are not getting enough exercise.  Have a family history of high cholesterol. What are the signs or symptoms? There are no symptoms of this condition. How is this diagnosed? This condition may be diagnosed from the results of a blood test.  If you are older than age 45, your health care provider may check your cholesterol every 4-6 years.  You may be checked more often if you already have high cholesterol or other risk factors for heart disease. The blood test for cholesterol measures:  "Bad" cholesterol (LDL cholesterol). This is the main type of cholesterol that causes heart disease. The desired level for LDL is less than 100.  "Good" cholesterol (HDL cholesterol). This type helps to protect against heart disease by cleaning the arteries and carrying the LDL away. The desired level for HDL is 60 or  higher.  Triglycerides. These are fats that the body can store or burn for energy. The desired number for triglycerides is lower than 150.  Total cholesterol. This is a measure of the total amount of cholesterol in your blood, including LDL cholesterol, HDL cholesterol, and triglycerides. A healthy number is less than 200. How is this treated? This condition is treated with diet changes, lifestyle changes, and medicines. Diet changes  This may include eating more whole grains, fruits, vegetables, nuts, and fish.  This may also include cutting back on red meat and foods that have a lot of added sugar. Lifestyle changes  Changes may include getting at least 40 minutes of aerobic exercise 3 times a week. Aerobic exercises include walking, biking, and swimming. Aerobic exercise along with a healthy diet can help you maintain a healthy weight.  Changes may also include quitting smoking. Medicines  Medicines are usually given if diet and lifestyle changes have failed to reduce your cholesterol to healthy levels.  Your health care provider may prescribe a statin medicine. Statin medicines have been shown to reduce cholesterol, which can reduce the risk of heart disease. Follow these instructions at home: Eating and drinking If told by your health care provider:  Eat chicken (without skin), fish, veal, shellfish, ground [place] breast, and round or loin cuts of red meat.  Do not eat fried foods or fatty meats, such  as hot dogs and salami.  Eat plenty of fruits, such as apples.  Eat plenty of vegetables, such as broccoli, potatoes, and carrots.  Eat beans, peas, and lentils.  Eat grains such as barley, rice, couscous, and bulgur wheat.  Eat pasta without cream sauces.  Use skim or nonfat milk, and eat low-fat or nonfat yogurt and cheeses.  Do not eat or drink whole milk, cream, ice cream, egg yolks, or hard cheeses.  Do not eat stick margarine or tub margarines that contain trans  fats (also called partially hydrogenated oils).  Do not eat saturated tropical oils, such as coconut oil and palm oil.  Do not eat cakes, cookies, crackers, or other baked goods that contain trans fats.  General instructions  Exercise as directed by your health care provider. Increase your activity level with activities such as gardening, walking, and taking the stairs.  Take over-the-counter and prescription medicines only as told by your health care provider.  Do not use any products that contain nicotine or tobacco, such as cigarettes and e-cigarettes. If you need help quitting, ask your health care provider.  Keep all follow-up visits as told by your health care provider. This is important. Contact a health care provider if:  You are struggling to maintain a healthy diet or weight.  You need help to start on an exercise program.  You need help to stop smoking. Get help right away if:  You have chest pain.  You have trouble breathing. This information is not intended to replace advice given to you by your health care provider. Make sure you discuss any questions you have with your health care provider. Document Revised: 02/17/2017 Document Reviewed: 08/15/2015 Elsevier Patient Education  [place].

## 2019-06-28 NOTE — Progress Notes (Signed)
Chronic Care Management Pharmacy  Name: Ann Wagner  MRN: [id number] DOB: 08/24/1945  Chief Complaint/ HPI  Ann Wagner,  74 y.o. , female presents for their Initial CCM visit with the clinical pharmacist via telephone due to COVID-19 Pandemic.  PCP : Midge Minium, MD  Their chronic conditions include: CAD, GERD, HTN, HoTR, HLD.  Office Visits  2/2 (Labs): TSH 4.72, levothyroxine increased to 88 mcg  1/26 (PCP): Started walking daily for exercise   Outpatient Encounter Medications as of 06/28/2019  Medication Sig  . albuterol (PROVENTIL HFA;VENTOLIN HFA) 108 (90 Base) MCG/ACT inhaler Inhale 2 puffs into the lungs every 6 (six) hours as needed for wheezing or shortness of breath.  Marland Kitchen amLODipine (NORVASC) 10 MG tablet TAKE 1 TABLET(10 MG) BY MOUTH DAILY  . Apoaequorin (PREVAGEN EXTRA STRENGTH) 20 MG CAPS Take 20 mg by mouth daily.  Marland Kitchen aspirin EC 81 MG tablet Take 1 tablet (81 mg total) by mouth daily.  . fenofibrate 160 MG tablet TAKE 1 TABLET BY MOUTH DAILY  . fluticasone (FLONASE) 50 MCG/ACT nasal spray SHAKE LIQUID AND USE 2 SPRAYS IN EACH NOSTRIL DAILY  . hydrochlorothiazide (HYDRODIURIL) 25 MG tablet TAKE 1 TABLET BY MOUTH DAILY  . levothyroxine (SYNTHROID) 88 MCG tablet Take 1 tablet (88 mcg total) by mouth daily.  Marland Kitchen lisinopril (ZESTRIL) 40 MG tablet Take 1 tablet (40 mg total) by mouth daily.  Marland Kitchen loratadine (CLARITIN) 10 MG tablet Take 10 mg by mouth daily.  . nitroGLYCERIN (NITROSTAT) 0.4 MG SL tablet PLACE 1 TABLET UNDER TONGUE EVERY 5 MINUTES AS NEEDED FOR CHEST PAIN  . pantoprazole (PROTONIX) 40 MG tablet TAKE 1 TABLET BY MOUTH DAILY AT NOON  . PARoxetine (PAXIL) 10 MG tablet TAKE 1 TABLET(10 MG) BY MOUTH DAILY  . potassium chloride (K-DUR) 10 MEQ tablet Take 10 mEq by mouth once.  . rosuvastatin (CRESTOR) 20 MG tablet Take 1 tablet (20 mg total) by mouth daily.   No facility-administered encounter medications on file as of 06/28/2019.   Current  Diagnosis/Assessment: Goals Addressed            This Visit's Progress   . BP <130/90       CARE PLAN ENTRY (see longitudinal plan of care for additional care plan information)  Current Barriers:  . Current antihypertensive regimen:   Amlodipine 10 mg daily   HCTZ 25 mg daily  Lisinopril 40 mg daily   Potassium 10 meq daily . Last practice recorded BP readings:  BP Readings from Last 3 Encounters:  03/26/19 136/83  12/04/18 (!) 142/68  09/20/18 128/78   Pharmacist Clinical Goal(s):  Marland Kitchen Over the next 180 days, patient will work with PharmD and providers to optimize antihypertensive regimen.  Interventions: . Inter-disciplinary care team collaboration (see longitudinal plan of care). . Comprehensive medication review performed; medication list updated in the electronic medical record.   Patient Self Care Activities:  . Consider weekly BP monitoring at home - document, and provide at future appointments. . Patient will focus on medication adherence by continuing current medication management.   Initial goal documentation.     Marland Kitchen GERD: Minimize recurring symptoms       CARE PLAN ENTRY  Current Barriers:  . Chronic Disease Management support, education, and care coordination needs related to Gastroesophageal Reflux Disease.  Pharmacist Clinical Goal(s):  Marland Kitchen Over next 180 days, minimize recurring symptoms of acid reflux.   Interventions: . Comprehensive medication review performed.  Patient Self Care Activities:  . Continue  Protonix and continue to avoid triggers such as citrus juices and tomato sauce.  Initial goal documentation.     Marland Kitchen LDL <70       CARE PLAN ENTRY (see longitudinal plan of care for additional care plan information)  Current Barriers:  . Current antihyperlipidemic regimen: fenofibrate 160 mg daily, rosuvastatin 20 mg  . Most recent lipid panel:     Component Value Date/Time   CHOL 133 03/05/2019 0809   TRIG 105 03/05/2019 0809   HDL 47  03/05/2019 0809   CHOLHDL 2.8 03/05/2019 0809   CHOLHDL 3 09/20/2018 1208   VLDL 25.0 09/20/2018 1208   LDLCALC 67 03/05/2019 0809   LDLDIRECT 92.9 12/20/2011 0924   . ASCVD risk enhancing conditions: age >46, HTN  Pharmacist Clinical Goal(s):  Marland Kitchen Over the next 180 days, patient will work with PharmD and providers towards optimized antihyperlipidemic therapy  Interventions: . Comprehensive medication review performed; medication list updated in electronic medical record.  Bertram Savin care team collaboration (see longitudinal plan of care)  Patient Self Care Activities:  . Patient will focus on medication adherence by continuing current medication management  Initial goal documentation      Hypertension   Office blood pressures are  BP Readings from Last 3 Encounters:  03/26/19 136/83  12/04/18 (!) 142/68  09/20/18 128/78   Lab Results  Component Value Date   K 3.9 04/02/2019   Patient is currently controlled on the following medications:   Amlodipine 10 mg daily   HCTZ 25 mg daily  Lisinopril 40 mg daily   Potassium 10 meq daily    Patient checks BP at home infrequently. BP has been controlled at recent visits. Denies dizziness, SOB, chest pain.  Plan Continue current medications   Hyperlipidemia   Lipid Panel     Component Value Date/Time   CHOL 133 03/05/2019 0809   TRIG 105 03/05/2019 0809   HDL 47 03/05/2019 0809   CHOLHDL 2.8 03/05/2019 0809   CHOLHDL 3 09/20/2018 1208   VLDL 25.0 09/20/2018 1208   LDLCALC 67 03/05/2019 0809   LDLDIRECT 92.9 12/20/2011 0924   LABVLDL 19 03/05/2019 0809   The 10-year ASCVD risk score (Goff DC Jr., et al., 2013) is: 20.3%   Values used to calculate the score:     Age: 25 years     Sex: Female     Is Non-Hispanic African American: No     Diabetic: No     Tobacco smoker: No     Systolic Blood Pressure: [id number] mmHg     Is BP treated: Yes     HDL Cholesterol: 47 mg/dL     Total Cholesterol: 133 mg/dL    Patient is currently controlled on the following medications: rosuvastatin 20 mg daily, fenofibrate 160 mg daily. Focus on vegetables, limits carbs. Denies any muscle or abdominal pain or n/v. Taking aspirin 81 mg daily for CVD prevention. Denies any abnormal bruising, bleeding from nose or gums or blood in urine or stool. We discussed:diet and exercise extensively. 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   Plan Continue current medications and control with diet and exercise.   Hypothyroidism   TSH  Date Value Ref Range Status  04/02/2019 4.72 (H) 0.35 - 4.50 uIU/mL Final   TSH elevated at last visit, levothyroxine increased to 88 mcg 04/2019. Denies any side effects at this time. Denies tiredness or feeling sluggish.   Needs TSH lab, patient expresses  understanding and agrees to schedule.   Plan Continue current medications.  Patient will schedule TSH lab.  GERD  Currently controlled on pantoprazole 40 mg daily. Patient denies dysphagia, heartburn or nausea.  Expresses understanding to avoid triggers such as citrus juices and tomato sauce. Denies any side effects at this time.  Plan  Continue current medication.  Vasovagal syncope   Patient is currently controlled on paroxetine 40 mg daily. Denies any side effects at this time.  Plan Continue current medication.  Allergies   Patient is currently controlled on the following medications: loratadine 10 mg, Flonase nasal spray,  We discussed:  diet and exercise extensively.  Plan Continue current medications.  Vaccines  Reviewed and discussed patient's vaccination history. Due for Shingrix.    Immunization History  Administered Date(s) Administered  . Influenza, High Dose Seasonal PF 11/20/2017, 11/09/2018  . Influenza,inj,Quad PF,6+ Mos 11/29/2014  . Influenza-Unspecified 12/29/2012, 10/29/2013  . PFIZER SARS-COV-2 Vaccination 04/10/2019, 05/01/2019   . Pneumococcal Conjugate-13 04/30/2014  . Pneumococcal Polysaccharide-23 04/05/2017  . Td 08/23/2016  . Zoster 02/28/2010   Plan Recommended patient receive Shingrix vaccine in office/pharmacy.   Medication Management  Pt uses [facility] for all medications Uses pill box? Yes.  Plan Continue current medication management strategy.  Follow up: 2 month phone visit ______________ Visit Information SDOH (Social Determinants of Health) assessments performed: Yes.   Food Insecurity: No Food Insecurity  . Worried About Charity fundraiser in the Last Year: Never true  . Ran Out of Food in the Last Year: Never true     Transportation Needs: No Transportation Needs  . Lack of Transportation (Medical): No  . Lack of Transportation (Non-Medical): No   Ms. Hamp was given information about Chronic Care Management services today including:  1. CCM service includes personalized support from designated clinical staff supervised by her physician, including individualized plan of care and coordination with other care providers 2. 24/7 contact phone numbers for assistance for urgent and routine care needs. 3. Standard insurance, coinsurance, copays and deductibles apply for chronic care management only during months in which we provide at least 20 minutes of these services. Most insurances cover these services at 100%, however patients may be responsible for any copay, coinsurance and/or deductible if applicable. This service may help you avoid the need for more expensive face-to-face services. 4. Only one practitioner may furnish and bill the service in a calendar month. 5. The patient may stop CCM services at any time (effective at the end of the month) by phone call to the office staff.  Patient agreed to services and verbal consent obtained.   Madelin Rear, Pharm.D. Clinical Pharmacist [facility] at [facility] [phone]

## 2019-07-30 ENCOUNTER — Other Ambulatory Visit: Payer: Self-pay | Admitting: General Practice

## 2019-07-30 MED ORDER — LEVOTHYROXINE SODIUM 88 MCG PO TABS: 88.0000 ug | ORAL_TABLET | Freq: Every day | ORAL | 0 refills | Status: DC

## 2019-08-06 ENCOUNTER — Other Ambulatory Visit: Payer: Self-pay | Admitting: General Practice

## 2019-08-06 MED ORDER — AMLODIPINE BESYLATE 10 MG PO TABS: ORAL_TABLET | ORAL | 0 refills | Status: DC

## 2019-08-16 ENCOUNTER — Other Ambulatory Visit: Payer: Self-pay | Admitting: General Practice

## 2019-08-16 MED ORDER — PAROXETINE HCL 10 MG PO TABS: ORAL_TABLET | ORAL | 0 refills | Status: DC

## 2019-08-20 ENCOUNTER — Other Ambulatory Visit: Payer: Self-pay | Admitting: General Practice

## 2019-08-20 MED ORDER — LISINOPRIL 40 MG PO TABS: 40.0000 mg | ORAL_TABLET | Freq: Every day | ORAL | 6 refills | Status: DC

## 2019-09-10 ENCOUNTER — Ambulatory Visit: Payer: Medicare Other

## 2019-09-10 ENCOUNTER — Other Ambulatory Visit: Payer: Self-pay

## 2019-09-10 NOTE — Progress Notes (Unsigned)
Chronic Care Management Pharmacy  Name: Ann Wagner  MRN: [id number] DOB: August 22, 1945  Chief Complaint/ HPI  Ann Wagner,  74 y.o. , female presents for their Follow-Up CCM visit with the clinical pharmacist via telephone due to COVID-19 Pandemic.  PCP : Midge Minium, MD  Their chronic conditions include: CAD, GERD, HTN, HoTR, HLD.  Office Visits  1/26 (PCP): Started walking daily for exercise   Patient Active Problem List   Diagnosis Date Noted  . Sinus bradycardia 01/15/2019  . Abnormal EKG 03/09/2016  . Essential hypertension 06/22/2015  . Carotid artery disease ([place]) 01/12/2015  . Coronary artery disease involving native coronary artery without angina pectoris 01/12/2015  . Abnormal exercise tolerance test 12/16/2014  . Plantar fasciitis, bilateral 08/20/2013  . 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 Rotator cuff dysfunction 03/09/2012  . Shingles 12/20/2011  . General medical examination 10/13/2010  . BACK PAIN 12/02/2009  . Vitamin D deficiency 09/14/2009  . Hypothyroid 06/10/2009  . Hyperlipidemia 06/10/2009  . ALLERGIC RHINITIS 06/10/2009  . GERD 06/10/2009  . PARESTHESIA 06/10/2009   Past Surgical History:  Procedure Laterality Date  . CARDIAC CATHETERIZATION N/A 01/12/2015   Procedure: Left Heart Cath and Coronary Angiography;  Surgeon: Peter M Martinique, MD;  Location: [place] [facility];  Service: Cardiovascular;  Laterality: N/A;  . CARDIAC CATHETERIZATION Right 01/12/2015   Procedure: Coronary Stent Intervention;  Surgeon: Peter M Martinique, MD;  Location: [place] [facility];  Service: Cardiovascular;  Laterality: Right;  . CORONARY STENT PLACEMENT  01/12/2015   des to [place]  . TUBAL LIGATION     Social History   Socioeconomic History  . Marital status: Divorced    Spouse name: Not on file  . Number of children: Not on file  . Years of education: Not on file  . Highest education level: Not on file  Occupational History  . Not on file   Tobacco Use  . Smoking status: Former Research scientist (life sciences)  . Smokeless tobacco: Never Used  Vaping Use  . Vaping Use: Never used  Substance and Sexual Activity  . Alcohol use: Yes    Comment: socially  . Drug use: No  . Sexual activity: Not on file  Other Topics Concern  . Not on file  Social History Narrative   Lives alone.   2 sons - 1 locally with 5 kids.   Retired Nurse, learning disability, enjoys antique shows.   Social Determinants of Health   Financial Resource Strain:   . Difficulty of Paying Living Expenses:   Food Insecurity: No Food Insecurity  . Worried About Charity fundraiser in the Last Year: Never true  . Ran Out of Food in the Last Year: Never true  Transportation Needs: No Transportation Needs  . Lack of Transportation (Medical): No  . Lack of Transportation (Non-Medical): No  Physical Activity:   . Days of Exercise per Week:   . Minutes of Exercise per Session:   Stress:   . Feeling of Stress :   Social Connections:   . Frequency of Communication with Friends and Family:   . Frequency of Social Gatherings with Friends and Family:   . Attends Religious Services:   . Active Member of Clubs or Organizations:   . Attends Archivist Meetings:   Marland Kitchen Marital Status:    Family History  Problem Relation Age of Onset  . Coronary artery disease Mother   . Hypertension Mother   . Heart attack Mother   .  Diabetes Mother   . Coronary artery disease Father   . Hypertension Father   . Coronary artery disease Paternal Grandmother   . Hypertension Paternal Grandmother   . Heart disease Son   . Hypertension Son   . Stroke Neg Hx   . Cancer Neg Hx    Allergies  Allergen Reactions  . Propoxyphene N-Acetaminophen Other (See Comments)    Made sick   Outpatient Encounter Medications as of 09/10/2019  Medication Sig  . albuterol (PROVENTIL HFA;VENTOLIN HFA) 108 (90 Base) MCG/ACT inhaler Inhale 2 puffs into the lungs every 6 (six) hours as needed for wheezing or shortness  of breath.  Marland Kitchen amLODipine (NORVASC) 10 MG tablet TAKE 1 TABLET(10 MG) BY MOUTH DAILY  . Apoaequorin (PREVAGEN EXTRA STRENGTH) 20 MG CAPS Take 20 mg by mouth daily.  Marland Kitchen aspirin EC 81 MG tablet Take 1 tablet (81 mg total) by mouth daily.  . fenofibrate 160 MG tablet TAKE 1 TABLET BY MOUTH DAILY  . fluticasone (FLONASE) 50 MCG/ACT nasal spray SHAKE LIQUID AND USE 2 SPRAYS IN EACH NOSTRIL DAILY  . hydrochlorothiazide (HYDRODIURIL) 25 MG tablet TAKE 1 TABLET BY MOUTH DAILY  . levothyroxine (SYNTHROID) 88 MCG tablet Take 1 tablet (88 mcg total) by mouth daily.  Marland Kitchen lisinopril (ZESTRIL) 40 MG tablet Take 1 tablet (40 mg total) by mouth daily.  Marland Kitchen loratadine (CLARITIN) 10 MG tablet Take 10 mg by mouth daily.  . nitroGLYCERIN (NITROSTAT) 0.4 MG SL tablet PLACE 1 TABLET UNDER TONGUE EVERY 5 MINUTES AS NEEDED FOR CHEST PAIN  . pantoprazole (PROTONIX) 40 MG tablet TAKE 1 TABLET BY MOUTH DAILY AT NOON  . PARoxetine (PAXIL) 10 MG tablet TAKE 1 TABLET(10 MG) BY MOUTH DAILY  . potassium chloride (K-DUR) 10 MEQ tablet Take 10 mEq by mouth once.  . rosuvastatin (CRESTOR) 20 MG tablet Take 1 tablet (20 mg total) by mouth daily.   No facility-administered encounter medications on file as of 09/10/2019.   Patient Care Team    Relationship Specialty Notifications Start End  Midge Minium, MD PCP - General   02/10/10    Comment: Melina Copa, MD PCP - Cardiology Cardiology Admissions 05/24/17   Dorothy Spark, MD Consulting Physician Cardiology  12/16/15   Madelin Rear, [facility] Pharmacist Pharmacist Admissions 06/07/19    Comment: phone number [phone]  Marica Otter, [place] Physician Optometry  06/26/19    Current Diagnosis/Assessment: Goals Addressed   None    Hypertension   Office blood pressures are  BP Readings from Last 3 Encounters:  03/26/19 136/83  12/04/18 (!) 142/68  09/20/18 128/78   Lab Results  Component Value Date   K 3.9 04/02/2019   K 4.3 09/20/2018   K 4.2  07/04/2018   Patient is currently controlled on the following medications:   Amlodipine 10 mg daily   HCTZ 25 mg daily  Lisinopril 40 mg daily   Potassium 10 meq daily    Patient checks BP at home infrequently. BP has been controlled at recent visits. Denies dizziness, SOB, chest pain.  Plan Continue current medications   Hyperlipidemia   Lipid Panel     Component Value Date/Time   CHOL 133 03/05/2019 0809   TRIG 105 03/05/2019 0809   HDL 47 03/05/2019 0809   CHOLHDL 2.8 03/05/2019 0809   CHOLHDL 3 09/20/2018 1208   VLDL 25.0 09/20/2018 1208   LDLCALC 67 03/05/2019 0809   LDLDIRECT 92.9 12/20/2011 0924   LABVLDL 19 03/05/2019 0809  The 10-year ASCVD risk score Mikey Bussing DC Brooke Bonito., et al., 2013) is: 20.3%   Values used to calculate the score:     Age: 77 years     Sex: Female     Is Non-Hispanic African American: No     Diabetic: No     Tobacco smoker: No     Systolic Blood Pressure: 557 mmHg     Is BP treated: Yes     HDL Cholesterol: 47 mg/dL     Total Cholesterol: 133 mg/dL   Patient is currently controlled on the following medications: rosuvastatin 20 mg daily, fenofibrate 160 mg daily. Focus on vegetables, limits carbs. Denies any muscle or abdominal pain or n/v. Taking aspirin 81 mg daily for CVD prevention. Denies any abnormal bruising, bleeding from nose or gums or blood in urine or stool.  We discussed:diet and exercise extensively. 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   Plan Continue current medications and control with diet and exercise.   Hypothyroidism   TSH  Date Value Ref Range Status  04/02/2019 4.72 (H) 0.35 - 4.50 uIU/mL Final   TSH elevated at 04/02/2019 at 4.72, levothyroxine increased to 88 mcg  TSH elevated at last visit, levothyroxine increased to 88 mcg 04/2019. Denies any side effects at this time. Denies tiredness or feeling sluggish.   Needs TSH lab, patient expresses  understanding and agrees to schedule.   Plan Continue current medications.  Patient will schedule TSH lab.  GERD  Currently controlled on pantoprazole 40 mg daily. Patient denies dysphagia, heartburn or nausea.  Expresses understanding to avoid triggers such as citrus juices and tomato sauce. Denies any side effects at this time.  Plan  Continue current medication.  Vasovagal syncope   Patient is currently controlled on paroxetine 40 mg daily. Denies any side effects at this time.  Plan Continue current medication.  Allergies   Patient is currently controlled on the following medications: loratadine 10 mg, Flonase nasal spray,  We discussed:  diet and exercise extensively.  Plan Continue current medications.  Vaccines  Reviewed and discussed patient's vaccination history. Due for Shingrix.    Immunization History  Administered Date(s) Administered  . Influenza, High Dose Seasonal PF 11/20/2017, 11/09/2018  . Influenza,inj,Quad PF,6+ Mos 11/29/2014  . Influenza-Unspecified 12/29/2012, 10/29/2013  . PFIZER SARS-COV-2 Vaccination 04/10/2019, 05/01/2019  . Pneumococcal Conjugate-13 04/30/2014  . Pneumococcal Polysaccharide-23 04/05/2017  . Td 08/23/2016  . Zoster 02/28/2010   Plan Recommended patient receive Shingrix vaccine in office/pharmacy.   Medication Management  [facility] Uses pill box? Yes.  Plan Continue current medication management strategy.  Follow up: 2 month phone visit ______________ Visit Information SDOH (Social Determinants of Health) assessments performed: Yes.   Food Insecurity: No Food Insecurity  . Worried About Charity fundraiser in the Last Year: Never true  . Ran Out of Food in the Last Year: Never true     Transportation Needs: No Transportation Needs  . Lack of Transportation (Medical): No  . Lack of Transportation (Non-Medical): No   Ms. Betsill was given information about Chronic Care Management  services today including:  1. CCM service includes personalized support from designated clinical staff supervised by her physician, including individualized plan of care and coordination with other care providers 2. 24/7 contact phone numbers for assistance for urgent and routine care needs. 3. Standard insurance, coinsurance, copays and deductibles apply for chronic care management only during  months in which we provide at least 20 minutes of these services. Most insurances cover these services at 100%, however patients may be responsible for any copay, coinsurance and/or deductible if applicable. This service may help you avoid the need for more expensive face-to-face services. 4. Only one practitioner may furnish and bill the service in a calendar month. 5. The patient may stop CCM services at any time (effective at the end of the month) by phone call to the office staff.  Patient agreed to services and verbal consent obtained.   Madelin Rear, Pharm.D. Clinical Pharmacist [facility] at [facility] [phone]

## 2019-09-11 ENCOUNTER — Other Ambulatory Visit: Payer: Self-pay

## 2019-09-11 ENCOUNTER — Ambulatory Visit: Payer: Medicare Other

## 2019-09-11 DIAGNOSIS — I10 Essential (primary) hypertension: Secondary | ICD-10-CM

## 2019-09-11 DIAGNOSIS — E782 Mixed hyperlipidemia: Secondary | ICD-10-CM

## 2019-09-11 DIAGNOSIS — E038 Other specified hypothyroidism: Secondary | ICD-10-CM

## 2019-09-11 NOTE — Patient Instructions (Addendum)
Please call to get your labs scheduled!. Call me at [phone] (direct line) with any questions - thank you!  - Edyth Gunnels., Clinical Pharmacist  The patient verbalized understanding of instructions provided today and agreed to receive a mailed copy of patient instruction and/or educational materials. Telephone follow up appointment with pharmacy team member scheduled.  Madelin Rear, Pharm.D., BCGP Clinical Pharmacist [facility] at [facility] [phone]  Hypothyroidism  Hypothyroidism is when the thyroid gland does not make enough of certain hormones (it is underactive). The thyroid gland is a small gland located in the lower front part of the neck, just in front of the windpipe (trachea). This gland makes hormones that help control how the body uses food for energy (metabolism) as well as how the heart and brain function. These hormones also play a role in keeping your bones strong. When the thyroid is underactive, it produces too little of the hormones thyroxine (T4) and triiodothyronine (T3). What are the causes? This condition may be caused by:  Hashimoto's disease. This is a disease in which the body's disease-fighting system (immune system) attacks the thyroid gland. This is the most common cause.  Viral infections.  Pregnancy.  Certain medicines.  Birth defects.  Past radiation treatments to the head or neck for cancer.  Past treatment with radioactive iodine.  Past exposure to radiation in the environment.  Past surgical removal of part or all of the thyroid.  Problems with a gland in the center of the brain (pituitary gland).  Lack of enough iodine in the diet. What increases the risk? You are more likely to develop this condition if:  You are female.  You have a family history of thyroid conditions.  You use a medicine called lithium.  You take medicines that affect the immune system (immunosuppressants). What are the signs or  symptoms? Symptoms of this condition include:  Feeling as though you have no energy (lethargy).  Not being able to tolerate cold.  Weight gain that is not explained by a change in diet or exercise habits.  Lack of appetite.  Dry skin.  Coarse hair.  Menstrual irregularity.  Slowing of thought processes.  Constipation.  Sadness or depression. How is this diagnosed? This condition may be diagnosed based on:  Your symptoms, your medical history, and a physical exam.  Blood tests. You may also have imaging tests, such as an ultrasound or MRI. How is this treated? This condition is treated with medicine that replaces the thyroid hormones that your body does not make. After you begin treatment, it may take several weeks for symptoms to go away. Follow these instructions at home:  Take over-the-counter and prescription medicines only as told by your health care provider.  If you start taking any new medicines, tell your health care provider.  Keep all follow-up visits as told by your health care provider. This is important. ? As your condition improves, your dosage of thyroid hormone medicine may change. ? You will need to have blood tests regularly so that your health care provider can monitor your condition. Contact a health care provider if:  Your symptoms do not get better with treatment.  You are taking thyroid replacement medicine and you: ? Sweat a lot. ? Have tremors. ? Feel anxious. ? Lose weight rapidly. ? Cannot tolerate heat. ? Have emotional swings. ? Have diarrhea. ? Feel weak. Get help right away if you have:  Chest pain.  An irregular heartbeat.  A rapid heartbeat.  Difficulty  breathing. Summary  Hypothyroidism is when the thyroid gland does not make enough of certain hormones (it is underactive).  When the thyroid is underactive, it produces too little of the hormones thyroxine (T4) and triiodothyronine (T3).  The most common cause is  Hashimoto's disease, a disease in which the body's disease-fighting system (immune system) attacks the thyroid gland. The condition can also be caused by viral infections, medicine, pregnancy, or past radiation treatment to the head or neck.  Symptoms may include weight gain, dry skin, constipation, feeling as though you do not have energy, and not being able to tolerate cold.  This condition is treated with medicine to replace the thyroid hormones that your body does not make. This information is not intended to replace advice given to you by your health care provider. Make sure you discuss any questions you have with your health care provider. Document Revised: 01/27/2017 Document Reviewed: 01/25/2017 Elsevier Patient Education  2020 [employer].

## 2019-09-11 NOTE — Progress Notes (Signed)
Chronic Care Management Pharmacy  Name: Ann Wagner  MRN: [id number] DOB: 1945-05-26  Chief Complaint/ HPI  Ann Wagner,  74 y.o. , female presents for their Follow-Up CCM visit with the clinical pharmacist via telephone due to COVID-19 Pandemic. Reports to be feeling great overall, recently traveled to [place] to see family.  Previous dose increase to levothyroxine 88 mcg following elevated TSH 04/2019. Has not followed-up for labs. Patient reminded and agrees to follow up by scheduling lab visit.   Walking daily for exercise   PCP : Midge Minium, MD  Their chronic conditions include: CAD, GERD, HTN, HoTR, HLD.  Patient Active Problem List   Diagnosis Date Noted  . Sinus bradycardia 01/15/2019  . Abnormal EKG 03/09/2016  . Essential hypertension 06/22/2015  . Carotid artery disease ([place]) 01/12/2015  . Coronary artery disease involving native coronary artery without angina pectoris 01/12/2015  . Abnormal exercise tolerance test 12/16/2014  . Plantar fasciitis, bilateral 08/20/2013  . 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 Rotator cuff dysfunction 03/09/2012  . Shingles 12/20/2011  . General medical examination 10/13/2010  . BACK PAIN 12/02/2009  . Vitamin D deficiency 09/14/2009  . Hypothyroid 06/10/2009  . Hyperlipidemia 06/10/2009  . ALLERGIC RHINITIS 06/10/2009  . GERD 06/10/2009  . PARESTHESIA 06/10/2009   Past Surgical History:  Procedure Laterality Date  . CARDIAC CATHETERIZATION N/A 01/12/2015   Procedure: Left Heart Cath and Coronary Angiography;  Surgeon: Peter M Martinique, MD;  Location: [place] [facility];  Service: Cardiovascular;  Laterality: N/A;  . CARDIAC CATHETERIZATION Right 01/12/2015   Procedure: Coronary Stent Intervention;  Surgeon: Peter M Martinique, MD;  Location: [place] [facility];  Service: Cardiovascular;  Laterality: Right;  . CORONARY STENT PLACEMENT  01/12/2015   des to [place]  . TUBAL LIGATION     Social History    Socioeconomic History  . Marital status: Divorced    Spouse name: Not on file  . Number of children: Not on file  . Years of education: Not on file  . Highest education level: Not on file  Occupational History  . Not on file  Tobacco Use  . Smoking status: Former Research scientist (life sciences)  . Smokeless tobacco: Never Used  Vaping Use  . Vaping Use: Never used  Substance and Sexual Activity  . Alcohol use: Yes    Comment: socially  . Drug use: No  . Sexual activity: Not on file  Other Topics Concern  . Not on file  Social History Narrative   Lives alone.   2 sons - 1 locally with 5 kids.   Retired Nurse, learning disability, enjoys antique shows.   Social Determinants of Health   Financial Resource Strain:   . Difficulty of Paying Living Expenses:   Food Insecurity: No Food Insecurity  . Worried About Charity fundraiser in the Last Year: Never true  . Ran Out of Food in the Last Year: Never true  Transportation Needs: No Transportation Needs  . Lack of Transportation (Medical): No  . Lack of Transportation (Non-Medical): No  Physical Activity:   . Days of Exercise per Week:   . Minutes of Exercise per Session:   Stress:   . Feeling of Stress :   Social Connections:   . Frequency of Communication with Friends and Family:   . Frequency of Social Gatherings with Friends and Family:   . Attends Religious Services:   . Active Member of Clubs or Organizations:   . Attends Archivist  Meetings:   Marland Kitchen Marital Status:    Family History  Problem Relation Age of Onset  . Coronary artery disease Mother   . Hypertension Mother   . Heart attack Mother   . Diabetes Mother   . Coronary artery disease Father   . Hypertension Father   . Coronary artery disease Paternal Grandmother   . Hypertension Paternal Grandmother   . Heart disease Son   . Hypertension Son   . Stroke Neg Hx   . Cancer Neg Hx    Allergies  Allergen Reactions  . Propoxyphene N-Acetaminophen Other (See Comments)     Made sick   Outpatient Encounter Medications as of 09/11/2019  Medication Sig  . albuterol (PROVENTIL HFA;VENTOLIN HFA) 108 (90 Base) MCG/ACT inhaler Inhale 2 puffs into the lungs every 6 (six) hours as needed for wheezing or shortness of breath.  Marland Kitchen amLODipine (NORVASC) 10 MG tablet TAKE 1 TABLET(10 MG) BY MOUTH DAILY  . Apoaequorin (PREVAGEN EXTRA STRENGTH) 20 MG CAPS Take 20 mg by mouth daily.  Marland Kitchen aspirin EC 81 MG tablet Take 1 tablet (81 mg total) by mouth daily.  . fenofibrate 160 MG tablet TAKE 1 TABLET BY MOUTH DAILY  . fluticasone (FLONASE) 50 MCG/ACT nasal spray SHAKE LIQUID AND USE 2 SPRAYS IN EACH NOSTRIL DAILY  . hydrochlorothiazide (HYDRODIURIL) 25 MG tablet TAKE 1 TABLET BY MOUTH DAILY  . levothyroxine (SYNTHROID) 88 MCG tablet Take 1 tablet (88 mcg total) by mouth daily.  Marland Kitchen lisinopril (ZESTRIL) 40 MG tablet Take 1 tablet (40 mg total) by mouth daily.  Marland Kitchen loratadine (CLARITIN) 10 MG tablet Take 10 mg by mouth daily.  . nitroGLYCERIN (NITROSTAT) 0.4 MG SL tablet PLACE 1 TABLET UNDER TONGUE EVERY 5 MINUTES AS NEEDED FOR CHEST PAIN  . pantoprazole (PROTONIX) 40 MG tablet TAKE 1 TABLET BY MOUTH DAILY AT NOON  . PARoxetine (PAXIL) 10 MG tablet TAKE 1 TABLET(10 MG) BY MOUTH DAILY  . potassium chloride (K-DUR) 10 MEQ tablet Take 10 mEq by mouth once.  . rosuvastatin (CRESTOR) 20 MG tablet Take 1 tablet (20 mg total) by mouth daily.   No facility-administered encounter medications on file as of 09/11/2019.   Patient Care Team    Relationship Specialty Notifications Start End  Midge Minium, MD PCP - General   02/10/10    Comment: Melina Copa, MD PCP - Cardiology Cardiology Admissions 05/24/17   Dorothy Spark, MD Consulting Physician Cardiology  12/16/15   Madelin Rear, [facility] Pharmacist Pharmacist Admissions 06/07/19    Comment: phone number [phone]  Marica Otter, [place] Physician Optometry  06/26/19    Current Diagnosis/Assessment:  Hypertension    BP Readings from Last 3 Encounters:  03/26/19 136/83  12/04/18 (!) 142/68  09/20/18 128/78   Lab Results  Component Value Date   K 3.9 04/02/2019   K 4.3 09/20/2018   K 4.2 07/04/2018   Patient checks BP at home infrequently. BP has been controlled at recent visits. Denies dizziness, SOB, chest pain. Patient is currently controlled on the following medications:   Amlodipine 10 mg daily   HCTZ 25 mg daily  Lisinopril 40 mg daily   Potassium 10 meq daily   Adherence reviewed, [place] on track.    Plan Continue current medications   Hyperlipidemia   Lipid Panel     Component Value Date/Time   CHOL 133 03/05/2019 0809   TRIG 105 03/05/2019 0809   HDL 47 03/05/2019 0809   CHOLHDL 2.8 03/05/2019 0809  CHOLHDL 3 09/20/2018 1208   VLDL 25.0 09/20/2018 1208   LDLCALC 67 03/05/2019 0809   LDLDIRECT 92.9 12/20/2011 0924   LABVLDL 19 03/05/2019 0809   The 10-year ASCVD risk score Mikey Bussing DC Jr., et al., 2013) is: 20.3%   Values used to calculate the score:     Age: 51 years     Sex: Female     Is Non-Hispanic African American: No     Diabetic: No     Tobacco smoker: No     Systolic Blood Pressure: 194 mmHg     Is BP treated: Yes     HDL Cholesterol: 47 mg/dL     Total Cholesterol: 133 mg/dL   Patient is currently controlled on the following medications: rosuvastatin 20 mg daily, fenofibrate 160 mg daily. Focus on vegetables, limits carbs. Denies any muscle or abdominal pain or n/v.  Taking aspirin 81 mg daily for CVD prevention. Denies any abnormal bruising, bleeding from nose or gums or blood in urine or stool.  We discussed recommendations for follow-up labs.  Adherence reviewed, [place] on track.   Plan Continue current medications and control with diet and exercise.   Hypothyroidism   TSH  Date Value Ref Range Status  04/02/2019 4.72 (H) 0.35 - 4.50 uIU/mL Final   Dose increase to levothyroxine 88 mcg following elevated TSH without follow-up lab. Patient  reminded and agrees to follow up by scheduling lab visit.   Plan Continue current medications. Pt to schedule follow-up lab.   Vaccines   Remains due for Shingrix.    Immunization History  Administered Date(s) Administered  . Influenza, High Dose Seasonal PF 11/20/2017, 11/09/2018  . Influenza,inj,Quad PF,6+ Mos 11/29/2014  . Influenza-Unspecified 12/29/2012, 10/29/2013  . PFIZER SARS-COV-2 Vaccination 04/10/2019, 05/01/2019  . Pneumococcal Conjugate-13 04/30/2014  . Pneumococcal Polysaccharide-23 04/05/2017  . Td 08/23/2016  . Zoster 02/28/2010   Plan Recommended patient receive Shingrix vaccine in office/pharmacy.   Medication Management   [facility]  Uses pill box? Yes.  Plan Continue current medication management strategy.  Follow up: 6 month phone visit. ______________ Visit Information  Madelin Rear, Pharm.D. Clinical Pharmacist [facility] at [facility] [phone]

## 2019-09-20 ENCOUNTER — Other Ambulatory Visit: Payer: Self-pay | Admitting: General Practice

## 2019-09-20 DIAGNOSIS — K219 Gastro-esophageal reflux disease without esophagitis: Secondary | ICD-10-CM

## 2019-09-20 MED ORDER — PANTOPRAZOLE SODIUM 40 MG PO TBEC: DELAYED_RELEASE_TABLET | ORAL | 0 refills | Status: DC

## 2019-09-23 IMAGING — MG DIGITAL SCREENING BILATERAL MAMMOGRAM WITH TOMO AND CAD
8 series · 8 of 24 positions shown · non-contrast
Comparison: Previous exam(s).

CLINICAL DATA: Screening.

EXAM:
DIGITAL SCREENING BILATERAL MAMMOGRAM WITH TOMO AND CAD

[L CC synth-2D]
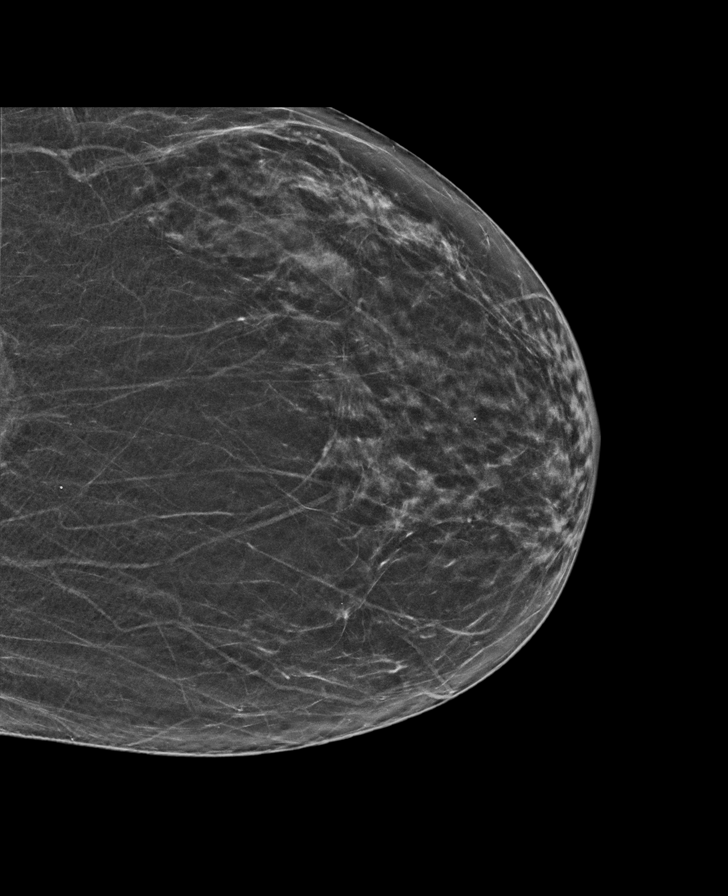

[R CC synth-2D]
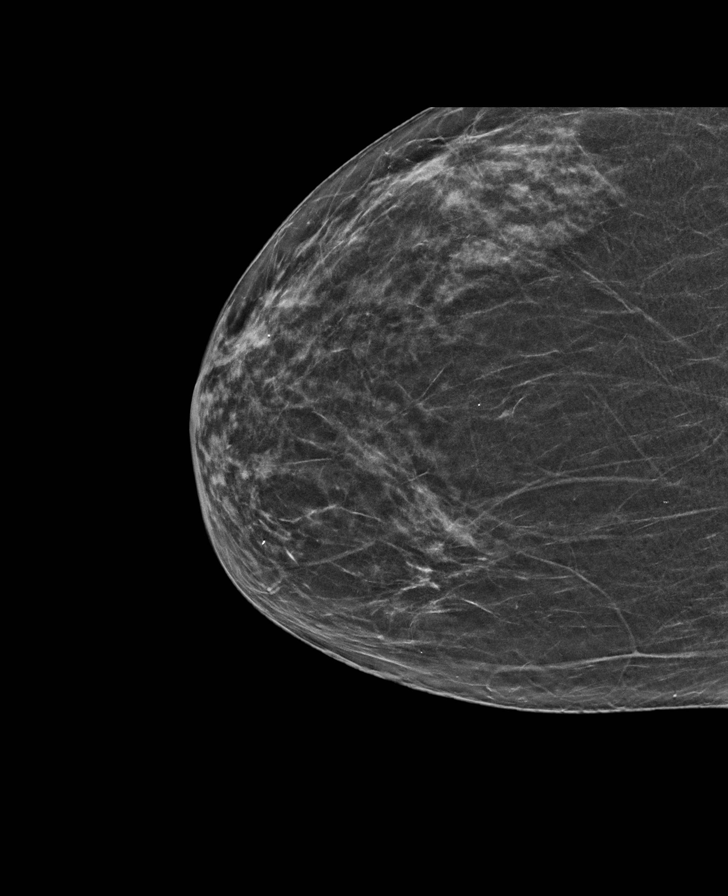

[L MLO synth-2D]
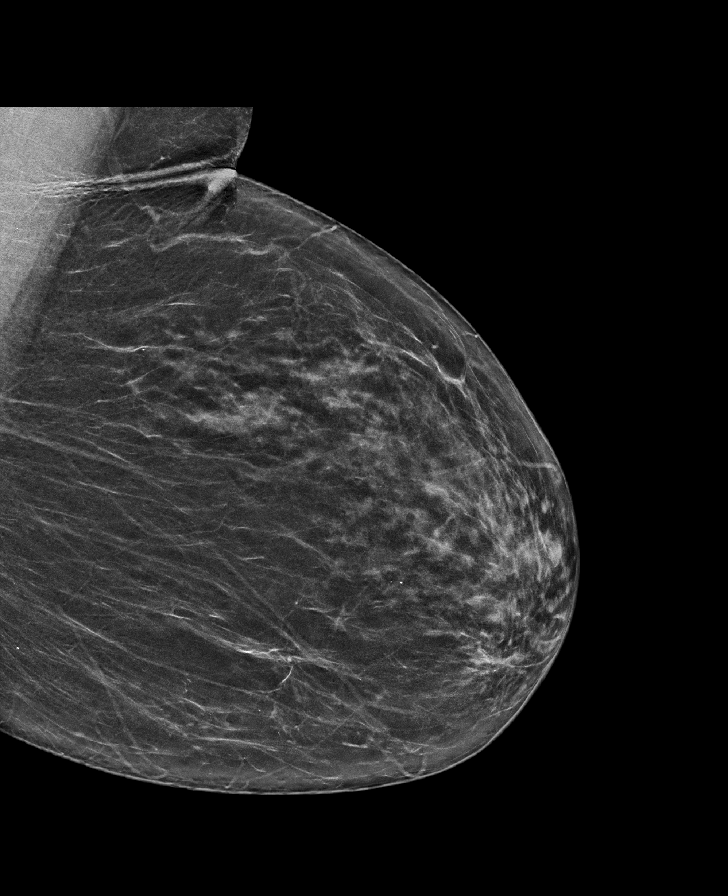

[R MLO synth-2D]
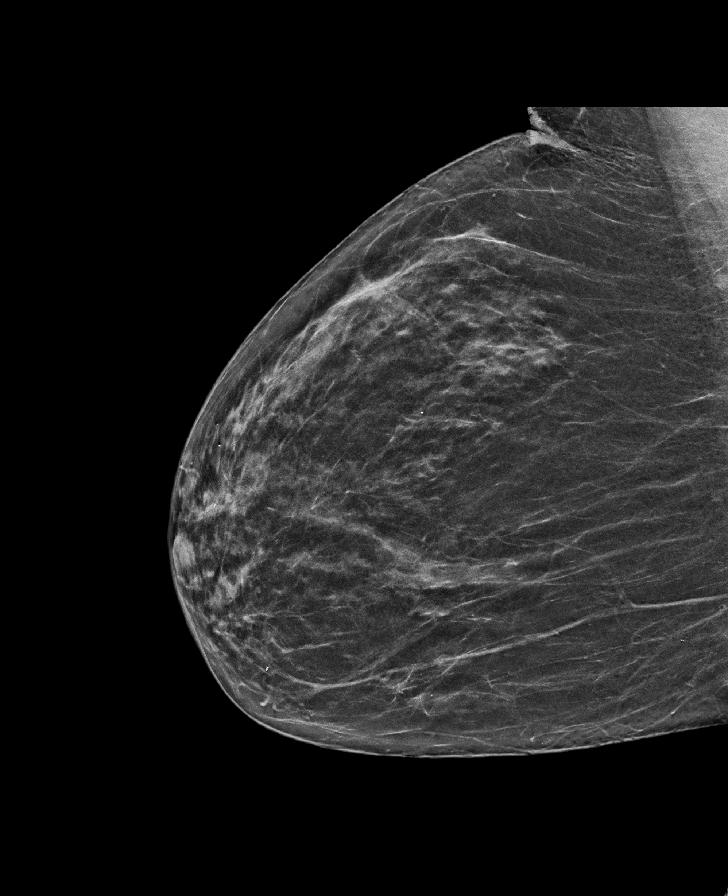

[R MLO tomo · tomo slice 33/64.0]
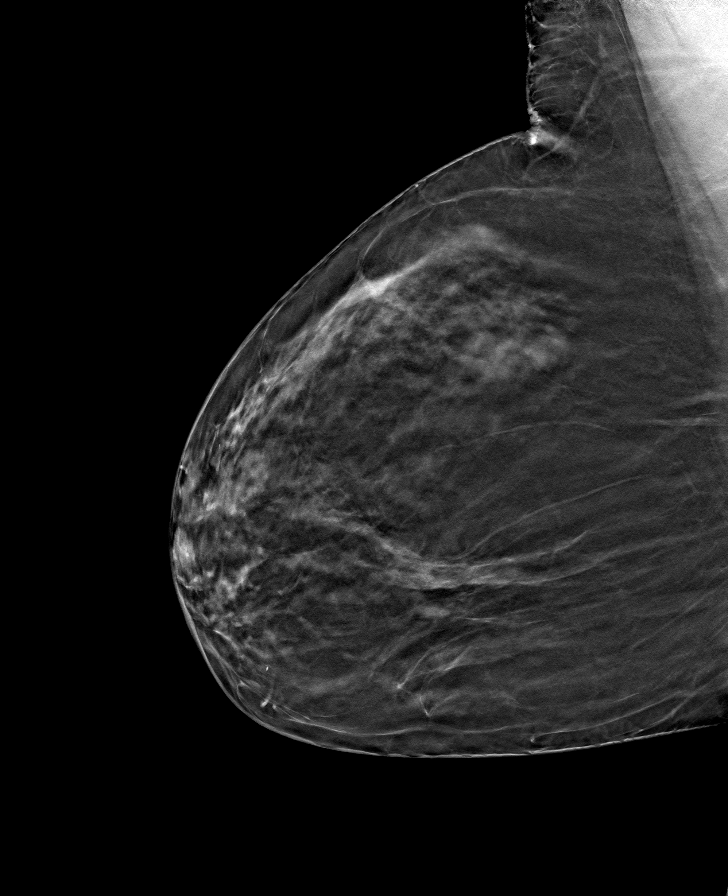

[L MLO tomo · tomo slice 35/68.0]
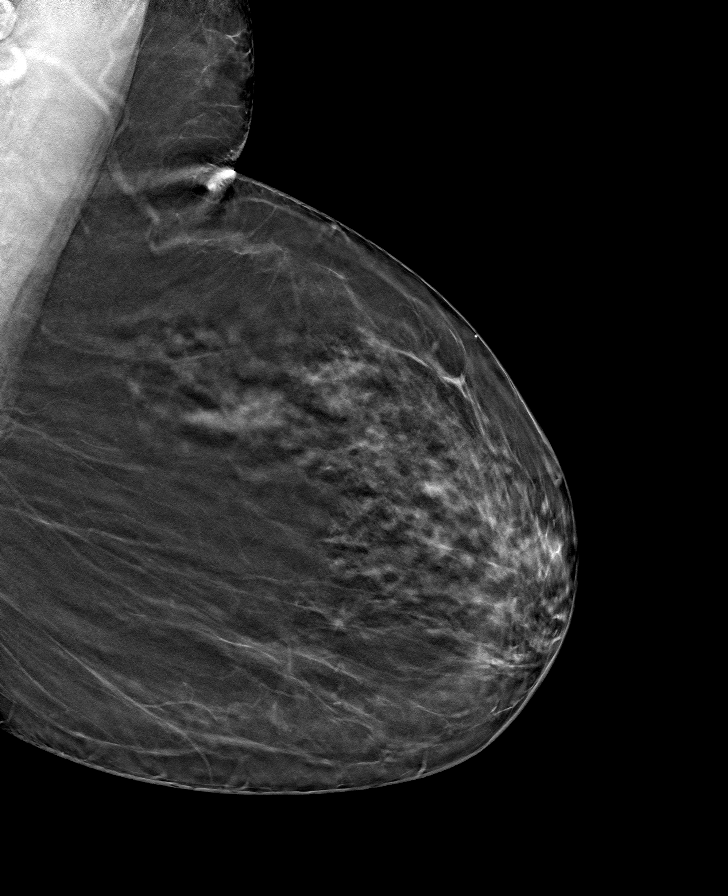

[R CC tomo · tomo slice 29/58.0]
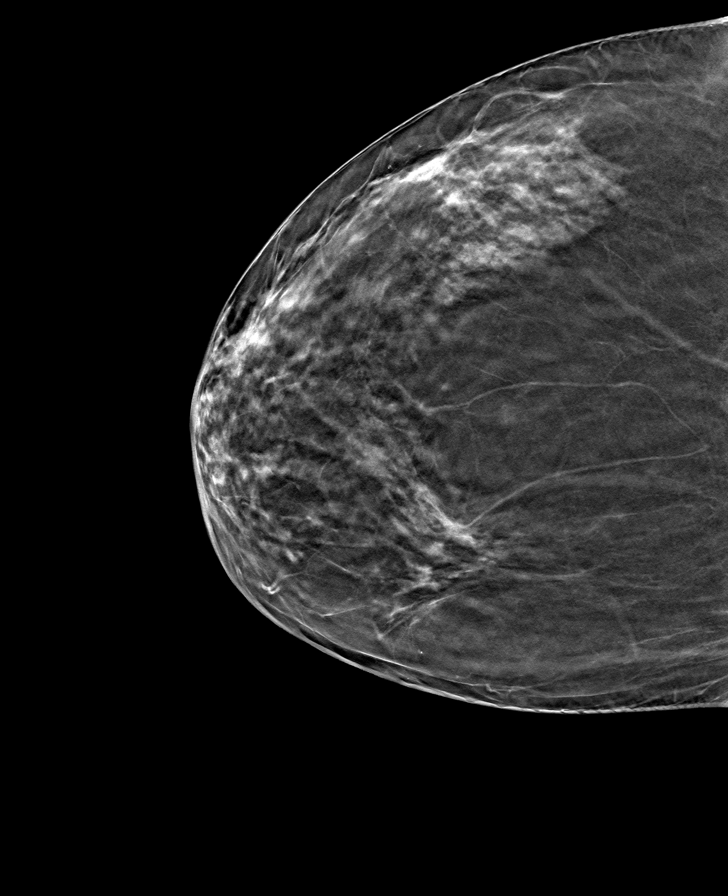

[L CC tomo · tomo slice 29/57.0]
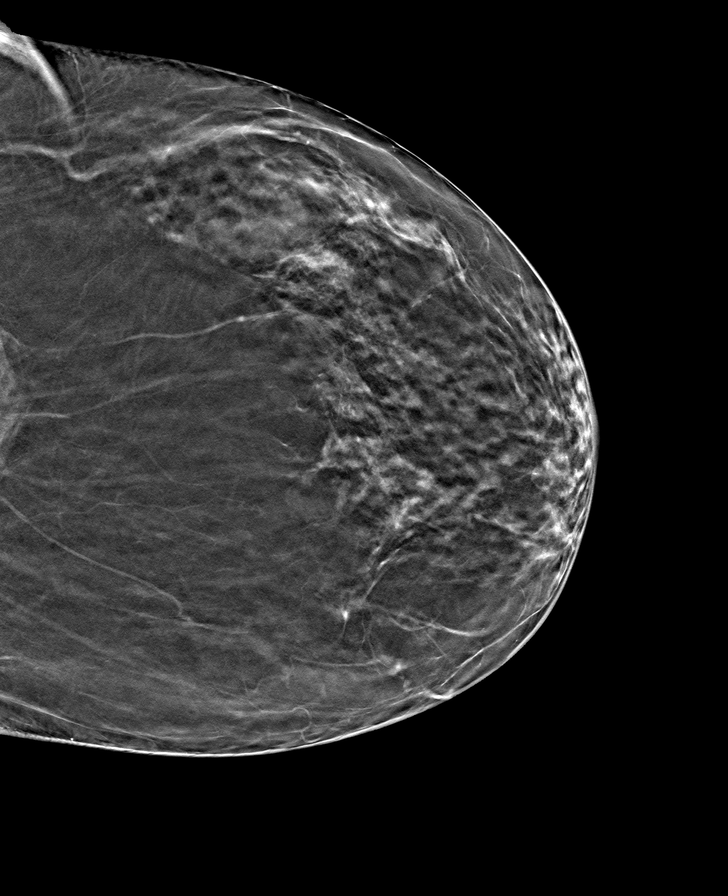

[8 of 24 positions shown; findings below may reference images not displayed]

ACR Breast Density Category b: There are scattered areas of
fibroglandular density.
FINDINGS: There are no findings suspicious for malignancy. Images were
processed with CAD.
IMPRESSION: No mammographic evidence of malignancy. A result letter of this
screening mammogram will be mailed directly to the patient.

RECOMMENDATION:
Screening mammogram in one year. (Code:CN-[id number])

BI-RADS CATEGORY  1: Negative.

## 2019-10-28 ENCOUNTER — Other Ambulatory Visit: Payer: Self-pay | Admitting: General Practice

## 2019-10-28 MED ORDER — LEVOTHYROXINE SODIUM 88 MCG PO TABS: 88.0000 ug | ORAL_TABLET | Freq: Every day | ORAL | 0 refills | Status: DC

## 2019-10-31 ENCOUNTER — Other Ambulatory Visit: Payer: Self-pay | Admitting: Family Medicine

## 2019-11-04 ENCOUNTER — Other Ambulatory Visit: Payer: Self-pay | Admitting: Physician Assistant

## 2019-11-10 ENCOUNTER — Other Ambulatory Visit: Payer: Self-pay | Admitting: Family Medicine

## 2019-11-15 ENCOUNTER — Other Ambulatory Visit: Payer: Self-pay | Admitting: Physician Assistant

## 2019-11-15 DIAGNOSIS — I10 Essential (primary) hypertension: Secondary | ICD-10-CM

## 2019-11-15 DIAGNOSIS — I2510 Atherosclerotic heart disease of native coronary artery without angina pectoris: Secondary | ICD-10-CM

## 2019-11-15 DIAGNOSIS — E782 Mixed hyperlipidemia: Secondary | ICD-10-CM

## 2019-11-15 DIAGNOSIS — I6523 Occlusion and stenosis of bilateral carotid arteries: Secondary | ICD-10-CM

## 2019-12-16 ENCOUNTER — Other Ambulatory Visit: Payer: Self-pay | Admitting: Family Medicine

## 2019-12-16 DIAGNOSIS — K219 Gastro-esophageal reflux disease without esophagitis: Secondary | ICD-10-CM

## 2020-01-13 ENCOUNTER — Other Ambulatory Visit: Payer: Self-pay | Admitting: Cardiology

## 2020-01-28 ENCOUNTER — Telehealth: Payer: Self-pay | Admitting: Family Medicine

## 2020-01-28 ENCOUNTER — Other Ambulatory Visit: Payer: Self-pay

## 2020-01-28 ENCOUNTER — Telehealth: Payer: Self-pay

## 2020-01-28 DIAGNOSIS — E038 Other specified hypothyroidism: Secondary | ICD-10-CM

## 2020-01-28 DIAGNOSIS — J9801 Acute bronchospasm: Secondary | ICD-10-CM

## 2020-01-28 DIAGNOSIS — I10 Essential (primary) hypertension: Secondary | ICD-10-CM

## 2020-01-28 MED ORDER — LEVOTHYROXINE SODIUM 88 MCG PO TABS: 88.0000 ug | ORAL_TABLET | Freq: Every day | ORAL | 0 refills | Status: DC

## 2020-01-28 MED ORDER — LISINOPRIL 40 MG PO TABS: 40.0000 mg | ORAL_TABLET | Freq: Every day | ORAL | 6 refills | Status: DC

## 2020-01-28 MED ORDER — AMLODIPINE BESYLATE 10 MG PO TABS: ORAL_TABLET | ORAL | 0 refills | Status: DC

## 2020-01-28 NOTE — Telephone Encounter (Signed)
Pt called in asking for refills on the amlodipine, levothyroxine, and lisinopril to be sent to the [facility] on [address]

## 2020-01-28 NOTE — Telephone Encounter (Signed)
Spoke with patient about refills. Sent to pharmacy on file. Patient voiced understanding. No further concerns at this time.

## 2020-01-28 NOTE — Telephone Encounter (Signed)
Rx sent to pharmacy   

## 2020-02-01 ENCOUNTER — Other Ambulatory Visit: Payer: Self-pay | Admitting: Cardiology

## 2020-02-03 ENCOUNTER — Other Ambulatory Visit: Payer: Self-pay | Admitting: Cardiology

## 2020-02-06 ENCOUNTER — Other Ambulatory Visit: Payer: Self-pay | Admitting: Family Medicine

## 2020-03-03 ENCOUNTER — Other Ambulatory Visit: Payer: Self-pay | Admitting: Cardiology

## 2020-03-13 ENCOUNTER — Ambulatory Visit: Payer: Medicare Other

## 2020-03-13 NOTE — Chronic Care Management (AMB) (Signed)
  Chronic Care Management   Outreach Note   Name: Ann Wagner MRN: [id number] DOB: 1945/04/17  Referred by: Midge Minium, MD Reason for referral: Telephone Appointment with [place] Pharmacist, Madelin Rear.   Telephone appointment with clinical pharmacist today (03/13/2020) at 1pm. If patient immediately returns call, transfer to [phone]. Otherwise, please provide this number so patient can reschedule visit.   Madelin Rear, Pharm.D., BCGP Clinical Pharmacist [facility] [phone]

## 2020-03-18 ENCOUNTER — Other Ambulatory Visit: Payer: Self-pay | Admitting: Emergency Medicine

## 2020-03-18 DIAGNOSIS — K219 Gastro-esophageal reflux disease without esophagitis: Secondary | ICD-10-CM

## 2020-03-18 MED ORDER — PANTOPRAZOLE SODIUM 40 MG PO TBEC: DELAYED_RELEASE_TABLET | ORAL | 0 refills | Status: DC

## 2020-04-01 ENCOUNTER — Telehealth: Payer: Self-pay

## 2020-04-01 NOTE — Progress Notes (Signed)
CARDIOLOGY OFFICE NOTE  Date:  04/15/2020    Ann Wagner Date of Birth: 26-Oct-1945 Medical Record [id number]  PCP:  Midge Minium, MD  Cardiologist:  Meda Coffee  Chief Complaint  Patient presents with  . Follow-up    Seen for Dr. Meda Coffee    History of Present Illness: Ann Wagner is a 75 y.o. female who presents today for a follow up visit. Seen for Dr. Meda Coffee.   She has a history of HTN, HLD, bilateral carotid disease, vasovagal syncope, abnormal GXT which led to cardiac CTA showing a calcium score of 580 with diffuse proximal to mid LAD lesion.  She underwent DES to the mid LAD 01/12/15 with 20% proximal RCA LVEF 65%.   Last saw Dr. Meda Coffee 02/2016 at which time she was doing well.  Plavix was stopped and she was continued on aspirin 81 mg daily.  2D echo 05/2016 because of an abnormal EKG showed normal LVEF 65-70% with dynamic obstruction at rest in the mid cavity with a peak velocity of 200 cm/s and a peak gradient of 16 mmHg.  Stress test was done 12/04/2018 normal low risk study EF 68%.  30-day monitor worn and some strips question atrial flutter but Dr. Burt Knack reviewed it and noted it to be artifact.  She has since been followed by Ermalinda Barrios, PA - last seen by a virtual visit in November of 2020.   Comes in today. Here alone. She is doing well. She is doing antique shows - traveling - this keeps her busy. Has been in more lately with the cold weather. She does like to walk. No chest pain. Not short of breath. Notes a fleeting palpitations sometimes with lying down at night. No syncope. Not dizzy or lightheaded. She has a fitbit on - with activity she can see her HR get in the mid 50's. She feels like she is doing ok and has no real concerns. She does need labs and medication refills.   Past Medical History:  Diagnosis Date  . Allergic rhinitis   . Arthritis   . Coronary artery disease    cath 01/12/2015 single vessel CAD, DES to mid LAD  . GERD (gastroesophageal  reflux disease)   . Headache   . History of chicken pox   . Hyperlipidemia   . Hypertension   . Hyperthyroidism   . Syncope     Past Surgical History:  Procedure Laterality Date  . CARDIAC CATHETERIZATION N/A 01/12/2015   Procedure: Left Heart Cath and Coronary Angiography;  Surgeon: Peter M Martinique, MD;  Location: [facility];  Service: Cardiovascular;  Laterality: N/A;  . CARDIAC CATHETERIZATION Right 01/12/2015   Procedure: Coronary Stent Intervention;  Surgeon: Peter M Martinique, MD;  Location: [facility];  Service: Cardiovascular;  Laterality: Right;  . CORONARY STENT PLACEMENT  01/12/2015   des to [place]  . TUBAL LIGATION       Medications: Current Meds  Medication Sig  . albuterol (PROVENTIL HFA;VENTOLIN HFA) 108 (90 Base) MCG/ACT inhaler Inhale 2 puffs into the lungs every 6 (six) hours as needed for wheezing or shortness of breath.  Marland Kitchen amLODipine (NORVASC) 10 MG tablet TAKE 1 TABLET(10 MG) BY MOUTH DAILY  . Apoaequorin (PREVAGEN EXTRA STRENGTH) 20 MG CAPS Take 20 mg by mouth daily.  Marland Kitchen aspirin EC 81 MG tablet Take 1 tablet (81 mg total) by mouth daily.  . fenofibrate 160 MG tablet TAKE 1 TABLET BY MOUTH DAILY  . fluticasone (  FLONASE) 50 MCG/ACT nasal spray SHAKE LIQUID AND USE 2 SPRAYS IN EACH NOSTRIL DAILY  . levothyroxine (SYNTHROID) 88 MCG tablet Take 1 tablet (88 mcg total) by mouth daily.  Marland Kitchen lisinopril (ZESTRIL) 40 MG tablet Take 1 tablet (40 mg total) by mouth daily.  Marland Kitchen loratadine (CLARITIN) 10 MG tablet Take 10 mg by mouth daily.  . nitroGLYCERIN (NITROSTAT) 0.4 MG SL tablet PLACE 1 TABLET UNDER TONGUE EVERY 5 MINUTES AS NEEDED FOR CHEST PAIN  . pantoprazole (PROTONIX) 40 MG tablet TAKE 1 TABLET BY MOUTH DAILY AT NOON. PLEASE SCHEDULE A FOLLOW UP APPOINTMENT  . PARoxetine (PAXIL) 10 MG tablet TAKE 1 TABLET(10 MG) BY MOUTH DAILY  . potassium chloride (K-DUR) 10 MEQ tablet Take 10 mEq by mouth once.  . [DISCONTINUED] hydrochlorothiazide (HYDRODIURIL) 25 MG  tablet Take 1 tablet (25 mg total) by mouth daily. Please make overdue appt with Dr. Meda Coffee before anymore refills. Thank you 2nd attempt  . [DISCONTINUED] rosuvastatin (CRESTOR) 20 MG tablet Take 1 tablet (20 mg total) by mouth daily. Pt needs to make appt with provider for further refills     Allergies: Allergies  Allergen Reactions  . Propoxyphene N-Acetaminophen Other (See Comments)    Made sick    Social History: The patient  reports that she has quit smoking. She has never used smokeless tobacco. She reports current alcohol use. She reports that she does not use drugs.   Family History: The patient's family history includes Coronary artery disease in her father, mother, and paternal grandmother; Diabetes in her mother; Heart attack in her mother; Heart disease in her son; Hypertension in her father, mother, paternal grandmother, and son.   Review of Systems: Please see the history of present illness.   All other systems are reviewed and negative.   Physical Exam: VS:  BP 140/60   Pulse (!) 46   Ht 5\' 5"  (1.651 m)   Wt 171 lb 9.6 oz (77.8 kg)   BMI 28.56 kg/m  .  BMI Body mass index is 28.56 kg/m.  Wt Readings from Last 3 Encounters:  04/15/20 171 lb 9.6 oz (77.8 kg)  03/26/19 175 lb (79.4 kg)  01/16/19 174 lb (78.9 kg)    General: Pleasant. Alert and in no acute distress.   Cardiac: Regular rate and rhythm. No murmurs, rubs, or gallops. No edema.  Respiratory:  Lungs are clear to auscultation bilaterally with normal work of breathing.  GI: Soft and nontender.  MS: No deformity or atrophy. Gait and ROM intact.  Skin: Warm and dry. Color is normal.  Neuro:  Strength and sensation are intact and no gross focal deficits noted.  Psych: Alert, appropriate and with normal affect.   LABORATORY DATA:  EKG:  EKG is ordered today.  Personally reviewed by me. This demonstrates sinus bradycardia - HR is 46.  Lab Results  Component Value Date   WBC 8.6 04/02/2019   HGB  13.7 04/02/2019   HCT 41.2 04/02/2019   PLT 280.0 04/02/2019   GLUCOSE 112 (H) 04/02/2019   CHOL 133 03/05/2019   TRIG 105 03/05/2019   HDL 47 03/05/2019   LDLDIRECT 92.9 12/20/2011   LDLCALC 67 03/05/2019   ALT 24 03/05/2019   AST 24 03/05/2019   NA 141 04/02/2019   K 3.9 04/02/2019   CL 104 04/02/2019   CREATININE 0.95 04/02/2019   BUN 22 04/02/2019   CO2 29 04/02/2019   TSH 4.72 (H) 04/02/2019   INR 0.94 01/07/2015   HGBA1C 6.6 (H)  11/21/2017     BNP (last 3 results) No results for input(s): BNP in the last 8760 hours.  ProBNP (last 3 results) No results for input(s): PROBNP in the last 8760 hours.   Other Studies Reviewed Today:  NST 12/07/2018 Study Highlights      Nuclear stress EF: 68%.  The study is normal.  This is a low risk study.  There was no ST segment deviation noted during stress.  No T wave inversion was noted during stress.   Low risk stress nuclear study with normal perfusion and normal left ventricular regional and global systolic function.    2 week monitor 12/18/18 Summary: The patient's monitoring period was 12/18/2018 - 12/31/2018. Baseline sample showed Sinus Bradycardia with a heart rate of 50.1 bpm. There were 0 critical, 0 serious, and 19 stable events that occurred. The report analysis of the critical, serious, stable and manually triggered events are listed below. Manually Detected Events: 2 Stable: Sinus Bradycardia w/Artifact . None or Accidental Push 1 Stable: Sinus Bradycardia w/Artifact/Lead Loss . Flutter or Skipped Beats 1 Stable: Sinus Rhythm w/Artifact/Lead Loss . None or Accidental Push 1 Stable: Sinus Bradycardia, Sinus Rhythm w/Atrial Run/Artifact . Flutter or Skipped Beats 2 Stable: Sinus Bradycardia w/Artifact/Lead Loss . None or Accidental Push Automatically Detected Events: 2 Stable:    ASSESSMENT & PLAN:     1. CAD - prior PCI/stent to mLAD from 2016. No worrisome symptoms - good exercise tolerance.    2. HLD - on statin - needs labs today.   3. HTN - BP fair - have asked her to monitor.   4. Chronic sinus bradycardia - she is not symptomatic.   5. Bilateral carotid disease - she has a repeat duplex next month.   Current medicines are reviewed with the patient today.  The patient does not have concerns regarding medicines other than what has been noted above.  The following changes have been made:  See above.  Labs/ tests ordered today include:    Orders Placed This Encounter  Procedures  . Basic metabolic panel  . CBC  . Hepatic function panel  . Lipid panel  . TSH     Disposition:   FU with Dr. Francesca Oman team in one year.    Patient is agreeable to this plan and will call if any problems develop in the interim.   SignedTruitt Merle, NP  04/15/2020 9:37 AM  [facility] [address] [place], [place]  [address] Phone: [phone] Fax: [phone]

## 2020-04-01 NOTE — Chronic Care Management (AMB) (Signed)
    Chronic Care Management Pharmacy Assistant   Name: AUSTIN PONGRATZ  MRN: [id number] DOB: 02-Sep-1945  Reason for Encounter: Reschedule Appointment  Attempted to reach the patient to reschedule her telephone appointment with clinical pharmacist Madelin Rear, CPP. I left a message for the patient to return my call at [phone].  April D Calhoun, [place] Pharmacist Assistant [phone]   Follow-Up:  Pharmacist Review

## 2020-04-15 ENCOUNTER — Ambulatory Visit: Payer: Medicare Other | Admitting: Nurse Practitioner

## 2020-04-15 ENCOUNTER — Encounter: Payer: Self-pay | Admitting: Nurse Practitioner

## 2020-04-15 ENCOUNTER — Other Ambulatory Visit: Payer: Self-pay

## 2020-04-15 VITALS — BP 140/60 | HR 46 | Ht 65.0 in | Wt 171.6 lb

## 2020-04-15 DIAGNOSIS — R001 Bradycardia, unspecified: Secondary | ICD-10-CM

## 2020-04-15 DIAGNOSIS — E782 Mixed hyperlipidemia: Secondary | ICD-10-CM

## 2020-04-15 DIAGNOSIS — E785 Hyperlipidemia, unspecified: Secondary | ICD-10-CM

## 2020-04-15 DIAGNOSIS — I6523 Occlusion and stenosis of bilateral carotid arteries: Secondary | ICD-10-CM

## 2020-04-15 DIAGNOSIS — I2510 Atherosclerotic heart disease of native coronary artery without angina pectoris: Secondary | ICD-10-CM

## 2020-04-15 DIAGNOSIS — I10 Essential (primary) hypertension: Secondary | ICD-10-CM

## 2020-04-15 LAB — BASIC METABOLIC PANEL
BUN/Creatinine Ratio: 28 (ref 12–28)
BUN: 28 mg/dL — ABNORMAL HIGH (ref 8–27)
CO2: 25 mmol/L (ref 20–29)
Calcium: 10.7 mg/dL — ABNORMAL HIGH (ref 8.7–10.3)
Chloride: 101 mmol/L (ref 96–106)
Creatinine, Ser: 1 mg/dL (ref 0.57–1.00)
GFR calc Af Amer: 64 mL/min/{1.73_m2} (ref >59)
GFR calc non Af Amer: 55 mL/min/{1.73_m2} — ABNORMAL LOW (ref >59)
Glucose: 130 mg/dL — ABNORMAL HIGH (ref 65–99)
Potassium: 4.6 mmol/L (ref 3.5–5.2)
Sodium: 142 mmol/L (ref 134–144)

## 2020-04-15 LAB — HEPATIC FUNCTION PANEL
ALT: 21 IU/L (ref 0–32)
AST: 20 IU/L (ref 0–40)
Albumin: 4.8 g/dL — ABNORMAL HIGH (ref 3.7–4.7)
Alkaline Phosphatase: 73 IU/L (ref 44–121)
Bilirubin Total: 0.3 mg/dL (ref 0.0–1.2)
Bilirubin, Direct: 0.11 mg/dL (ref 0.00–0.40)
Total Protein: 7 g/dL (ref 6.0–8.5)

## 2020-04-15 LAB — LIPID PANEL
Chol/HDL Ratio: 2.7 ratio (ref 0.0–4.4)
Cholesterol, Total: 144 mg/dL (ref 100–199)
HDL: 53 mg/dL (ref >39)
LDL Chol Calc (NIH): 76 mg/dL (ref 0–99)
Triglycerides: 77 mg/dL (ref 0–149)
VLDL Cholesterol Cal: 15 mg/dL (ref 5–40)

## 2020-04-15 LAB — CBC
Hematocrit: 44.5 % (ref 34.0–46.6)
Hemoglobin: 14.4 g/dL (ref 11.1–15.9)
MCH: 29.6 pg (ref 26.6–33.0)
MCHC: 32.4 g/dL (ref 31.5–35.7)
MCV: 92 fL (ref 79–97)
Platelets: 294 10*3/uL (ref 150–450)
RBC: 4.86 x10E6/uL (ref 3.77–5.28)
RDW: 12.7 % (ref 11.7–15.4)
WBC: 8.7 10*3/uL (ref 3.4–10.8)

## 2020-04-15 LAB — TSH: TSH: 1.04 u[IU]/mL (ref 0.450–4.500)

## 2020-04-15 MED ORDER — ROSUVASTATIN CALCIUM 20 MG PO TABS
20.0000 mg | ORAL_TABLET | Freq: Every day | ORAL | 3 refills | Status: DC
Start: 2020-04-15 — End: 2021-01-08

## 2020-04-15 MED ORDER — HYDROCHLOROTHIAZIDE 25 MG PO TABS: 25.0000 mg | ORAL_TABLET | Freq: Every day | ORAL | 3 refills | Status: DC

## 2020-04-15 NOTE — Patient Instructions (Signed)
After Visit Summary:  We will be checking the following labs today - BMET, CBC, HPF, TSH and Lipids   Medication Instructions:    Continue with your current medicines.   I refilled the Crestor and HCTZ today   If you need a refill on your cardiac medications before your next appointment, please call your pharmacy.     Testing/Procedures To Be Arranged:  N/A  Follow-Up:   See [place] back in one year -  You will receive a reminder letter in the mail two months in advance. If you don't receive a letter, please call our office to schedule the follow-up appointment.     At [facility], you and your health needs are our priority.  As part of our continuing mission to provide you with exceptional heart care, we have created designated Provider Care Teams.  These Care Teams include your primary Cardiologist (physician) and Advanced Practice Providers (APPs -  Physician Assistants and Nurse Practitioners) who all work together to provide you with the care you need, when you need it.  Special Instructions:  . Stay safe, wash your hands for at least 20 seconds and wear a mask when needed.  . It was good to talk with you today.    Call the [place] office at [phone] if you have any questions, problems or concerns.

## 2020-04-17 NOTE — Addendum Note (Signed)
Addended by: Gar Ponto on: 04/17/2020 10:38 AM   Modules accepted: Orders

## 2020-04-20 ENCOUNTER — Other Ambulatory Visit: Payer: Self-pay | Admitting: Family Medicine

## 2020-04-20 DIAGNOSIS — K219 Gastro-esophageal reflux disease without esophagitis: Secondary | ICD-10-CM

## 2020-04-24 ENCOUNTER — Telehealth: Payer: Self-pay

## 2020-04-24 NOTE — Chronic Care Management (AMB) (Signed)
    Chronic Care Management Pharmacy Assistant   Name: Ann Wagner  MRN: [id number] DOB: Jul 10, 1945  Reason for Encounter: Chart Review  PCP : Midge Minium, MD  Allergies:   Allergies  Allergen Reactions  . Propoxyphene N-Acetaminophen Other (See Comments)    Made sick    Medications: Outpatient Encounter Medications as of 04/24/2020  Medication Sig  . albuterol (PROVENTIL HFA;VENTOLIN HFA) 108 (90 Base) MCG/ACT inhaler Inhale 2 puffs into the lungs every 6 (six) hours as needed for wheezing or shortness of breath.  Marland Kitchen amLODipine (NORVASC) 10 MG tablet TAKE 1 TABLET(10 MG) BY MOUTH DAILY  . Apoaequorin (PREVAGEN EXTRA STRENGTH) 20 MG CAPS Take 20 mg by mouth daily.  Marland Kitchen aspirin EC 81 MG tablet Take 1 tablet (81 mg total) by mouth daily.  . fenofibrate 160 MG tablet TAKE 1 TABLET BY MOUTH DAILY  . fluticasone (FLONASE) 50 MCG/ACT nasal spray SHAKE LIQUID AND USE 2 SPRAYS IN EACH NOSTRIL DAILY  . hydrochlorothiazide (HYDRODIURIL) 25 MG tablet Take 1 tablet (25 mg total) by mouth daily.  Marland Kitchen levothyroxine (SYNTHROID) 88 MCG tablet Take 1 tablet (88 mcg total) by mouth daily.  Marland Kitchen lisinopril (ZESTRIL) 40 MG tablet Take 1 tablet (40 mg total) by mouth daily.  Marland Kitchen loratadine (CLARITIN) 10 MG tablet Take 10 mg by mouth daily.  . nitroGLYCERIN (NITROSTAT) 0.4 MG SL tablet PLACE 1 TABLET UNDER TONGUE EVERY 5 MINUTES AS NEEDED FOR CHEST PAIN  . pantoprazole (PROTONIX) 40 MG tablet TAKE 1 TABLET BY MOUTH DAILY AT NOON  . PARoxetine (PAXIL) 10 MG tablet TAKE 1 TABLET(10 MG) BY MOUTH DAILY  . potassium chloride (K-DUR) 10 MEQ tablet Take 10 mEq by mouth once.  . rosuvastatin (CRESTOR) 20 MG tablet Take 1 tablet (20 mg total) by mouth daily.   No facility-administered encounter medications on file as of 04/24/2020.    Current Diagnosis: Patient Active Problem List   Diagnosis Date Noted  . Sinus bradycardia 01/15/2019  . Abnormal EKG 03/09/2016  . Essential hypertension 06/22/2015  .  Carotid artery disease ([place]) 01/12/2015  . Coronary artery disease involving native coronary artery without angina pectoris 01/12/2015  . Abnormal exercise tolerance test 12/16/2014  . Plantar fasciitis, bilateral 08/20/2013  . 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 Rotator cuff dysfunction 03/09/2012  . Shingles 12/20/2011  . General medical examination 10/13/2010  . BACK PAIN 12/02/2009  . Vitamin D deficiency 09/14/2009  . Hypothyroid 06/10/2009  . Hyperlipidemia 06/10/2009  . ALLERGIC RHINITIS 06/10/2009  . GERD 06/10/2009  . PARESTHESIA 06/10/2009    Reviewed chart for medication changes ahead of medication coordination call.  04/15/2020 OV (cardiology) Dr. Servando Snare; CAD- prior PCI/stent to mLAD from 2016, no worrisome sxs, good exercise tolerance, HLD - on statin, HTN - BP fair, Chronic sinus bradycardia - asymptomatic, Bilateral carotid disease - due for repeat duplex next month, f/u 1 year  No medication changes indicated.  Patient is does not have a scheduled follow up appointment with clinical pharmacist Ann Wagner, CPP. I left a message for the patient to return my call to schedule this appointment.  Ann Wagner, [place] Pharmacist Assistant [phone]   Follow-Up:  Pharmacist Review

## 2020-04-28 ENCOUNTER — Other Ambulatory Visit: Payer: Self-pay | Admitting: Family Medicine

## 2020-04-28 DIAGNOSIS — I10 Essential (primary) hypertension: Secondary | ICD-10-CM

## 2020-04-28 DIAGNOSIS — E038 Other specified hypothyroidism: Secondary | ICD-10-CM

## 2020-05-02 ENCOUNTER — Other Ambulatory Visit: Payer: Self-pay | Admitting: Family Medicine

## 2020-05-05 ENCOUNTER — Telehealth: Payer: Self-pay | Admitting: *Deleted

## 2020-05-05 ENCOUNTER — Ambulatory Visit (HOSPITAL_COMMUNITY)
Admission: RE | Admit: 2020-05-05 | Discharge: 2020-05-05 | Disposition: A | Discharge status: Home / Self Care | Payer: Medicare Other | Source: Ambulatory Visit | Attending: Internal Medicine | Admitting: Internal Medicine

## 2020-05-05 ENCOUNTER — Other Ambulatory Visit: Payer: Self-pay

## 2020-05-05 ENCOUNTER — Other Ambulatory Visit: Payer: Self-pay | Admitting: Cardiology

## 2020-05-05 DIAGNOSIS — I6523 Occlusion and stenosis of bilateral carotid arteries: Secondary | ICD-10-CM

## 2020-05-05 DIAGNOSIS — E785 Hyperlipidemia, unspecified: Secondary | ICD-10-CM

## 2020-05-05 NOTE — Telephone Encounter (Signed)
Pt informed about carotid US results and recommendations per Dr. Meda Coffee, for her to have a repeat study done in one year.  Informed the pt that I will go ahead and place the order for the pt to have this done in one year in the system and have our [place] call her back and arrange this appt.  Order will be placed under Dr. Johney Frame, for she will be taking over the pts care, since Dr. Meda Coffee will be leaving the practice. Pt verbalized understanding and agrees with this plan.

## 2020-05-05 NOTE — Telephone Encounter (Signed)
-----   Message from Dorothy Spark, MD sent at 05/05/2020 11:35 AM EST ----- Velocities in the right ICA are consistent with a 40-59%  stenosis. Velocities in the left ICA are consistent with a 1-39% stenosis. No intervention needed, repeat in 1 year.

## 2020-06-01 ENCOUNTER — Other Ambulatory Visit: Payer: Self-pay | Admitting: Family Medicine

## 2020-06-01 DIAGNOSIS — Z1231 Encounter for screening mammogram for malignant neoplasm of breast: Secondary | ICD-10-CM

## 2020-06-02 ENCOUNTER — Other Ambulatory Visit: Payer: Self-pay

## 2020-06-02 ENCOUNTER — Ambulatory Visit
Admission: RE | Admit: 2020-06-02 | Discharge: 2020-06-02 | Disposition: A | Discharge status: Home / Self Care | Payer: Medicare Other | Source: Ambulatory Visit | Attending: Family Medicine | Admitting: Family Medicine

## 2020-06-02 DIAGNOSIS — Z1231 Encounter for screening mammogram for malignant neoplasm of breast: Secondary | ICD-10-CM

## 2020-07-08 ENCOUNTER — Other Ambulatory Visit: Payer: Self-pay | Admitting: Family Medicine

## 2020-07-08 DIAGNOSIS — K219 Gastro-esophageal reflux disease without esophagitis: Secondary | ICD-10-CM

## 2020-07-15 ENCOUNTER — Other Ambulatory Visit: Payer: Self-pay | Admitting: Family Medicine

## 2020-07-15 DIAGNOSIS — I10 Essential (primary) hypertension: Secondary | ICD-10-CM

## 2020-07-15 DIAGNOSIS — E038 Other specified hypothyroidism: Secondary | ICD-10-CM

## 2020-07-31 ENCOUNTER — Other Ambulatory Visit: Payer: Self-pay

## 2020-07-31 DIAGNOSIS — I10 Essential (primary) hypertension: Secondary | ICD-10-CM

## 2020-07-31 MED ORDER — LISINOPRIL 40 MG PO TABS: 40.0000 mg | ORAL_TABLET | Freq: Every day | ORAL | 0 refills | Status: DC

## 2020-08-04 ENCOUNTER — Ambulatory Visit (INDEPENDENT_AMBULATORY_CARE_PROVIDER_SITE_OTHER): Payer: Medicare Other | Admitting: Family Medicine

## 2020-08-04 ENCOUNTER — Other Ambulatory Visit: Payer: Self-pay

## 2020-08-04 ENCOUNTER — Encounter: Payer: Self-pay | Admitting: Family Medicine

## 2020-08-04 VITALS — BP 130/70 | HR 48 | Temp 97.1°F | Resp 18 | Ht 65.0 in | Wt 174.8 lb

## 2020-08-04 DIAGNOSIS — K219 Gastro-esophageal reflux disease without esophagitis: Secondary | ICD-10-CM

## 2020-08-04 DIAGNOSIS — E038 Other specified hypothyroidism: Secondary | ICD-10-CM | POA: Diagnosis not present

## 2020-08-04 DIAGNOSIS — Z1211 Encounter for screening for malignant neoplasm of colon: Secondary | ICD-10-CM

## 2020-08-04 DIAGNOSIS — Z1159 Encounter for screening for other viral diseases: Secondary | ICD-10-CM

## 2020-08-04 DIAGNOSIS — Z0000 Encounter for general adult medical examination without abnormal findings: Secondary | ICD-10-CM

## 2020-08-04 DIAGNOSIS — I10 Essential (primary) hypertension: Secondary | ICD-10-CM

## 2020-08-04 DIAGNOSIS — E559 Vitamin D deficiency, unspecified: Secondary | ICD-10-CM

## 2020-08-04 LAB — BASIC METABOLIC PANEL
BUN: 31 mg/dL — ABNORMAL HIGH (ref 6–23)
CO2: 27 mEq/L (ref 19–32)
Calcium: 10.6 mg/dL — ABNORMAL HIGH (ref 8.4–10.5)
Chloride: 102 mEq/L (ref 96–112)
Creatinine, Ser: 0.89 mg/dL (ref 0.40–1.20)
GFR: 63.31 mL/min (ref >60.00)
Glucose, Bld: 114 mg/dL — ABNORMAL HIGH (ref 70–99)
Potassium: 4.3 mEq/L (ref 3.5–5.1)
Sodium: 140 mEq/L (ref 135–145)

## 2020-08-04 LAB — CBC WITH DIFFERENTIAL/PLATELET
Basophils Absolute: 0.1 10*3/uL (ref 0.0–0.1)
Basophils Relative: 0.7 % (ref 0.0–3.0)
Eosinophils Absolute: 0.7 10*3/uL (ref 0.0–0.7)
Eosinophils Relative: 7.3 % — ABNORMAL HIGH (ref 0.0–5.0)
HCT: 42.4 % (ref 36.0–46.0)
Hemoglobin: 14.2 g/dL (ref 12.0–15.0)
Lymphocytes Relative: 31 % (ref 12.0–46.0)
Lymphs Abs: 2.8 10*3/uL (ref 0.7–4.0)
MCHC: 33.4 g/dL (ref 30.0–36.0)
MCV: 89.3 fl (ref 78.0–100.0)
Monocytes Absolute: 0.8 10*3/uL (ref 0.1–1.0)
Monocytes Relative: 8.7 % (ref 3.0–12.0)
Neutro Abs: 4.6 10*3/uL (ref 1.4–7.7)
Neutrophils Relative %: 52.3 % (ref 43.0–77.0)
Platelets: 276 10*3/uL (ref 150.0–400.0)
RBC: 4.75 Mil/uL (ref 3.87–5.11)
RDW: 13.5 % (ref 11.5–15.5)
WBC: 8.9 10*3/uL (ref 4.0–10.5)

## 2020-08-04 LAB — LIPID PANEL
Cholesterol: 144 mg/dL (ref 0–200)
HDL: 49.3 mg/dL (ref >39.00)
LDL Cholesterol: 70 mg/dL (ref 0–99)
NonHDL: 94.33
Total CHOL/HDL Ratio: 3
Triglycerides: 120 mg/dL (ref 0.0–149.0)
VLDL: 24 mg/dL (ref 0.0–40.0)

## 2020-08-04 LAB — HEPATIC FUNCTION PANEL
ALT: 16 U/L (ref 0–35)
AST: 19 U/L (ref 0–37)
Albumin: 4.6 g/dL (ref 3.5–5.2)
Alkaline Phosphatase: 60 U/L (ref 39–117)
Bilirubin, Direct: 0.1 mg/dL (ref 0.0–0.3)
Total Bilirubin: 0.4 mg/dL (ref 0.2–1.2)
Total Protein: 6.9 g/dL (ref 6.0–8.3)

## 2020-08-04 LAB — TSH: TSH: 2.51 u[IU]/mL (ref 0.35–4.50)

## 2020-08-04 LAB — VITAMIN D 25 HYDROXY (VIT D DEFICIENCY, FRACTURES): VITD: 26.73 ng/mL — ABNORMAL LOW (ref 30.00–100.00)

## 2020-08-04 MED ORDER — LISINOPRIL 40 MG PO TABS
40.0000 mg | ORAL_TABLET | Freq: Every day | ORAL | 1 refills | Status: DC
Start: 2020-08-04 — End: 2021-06-03

## 2020-08-04 MED ORDER — PAROXETINE HCL 10 MG PO TABS: ORAL_TABLET | ORAL | 1 refills | Status: DC

## 2020-08-04 MED ORDER — PANTOPRAZOLE SODIUM 40 MG PO TBEC: DELAYED_RELEASE_TABLET | ORAL | 0 refills | Status: DC

## 2020-08-04 MED ORDER — AMLODIPINE BESYLATE 10 MG PO TABS: ORAL_TABLET | ORAL | 1 refills | Status: DC

## 2020-08-04 MED ORDER — LEVOTHYROXINE SODIUM 88 MCG PO TABS: ORAL_TABLET | ORAL | 1 refills | Status: DC

## 2020-08-04 NOTE — Progress Notes (Signed)
Subjective:    Patient ID: Ann Wagner, female    DOB: Oct 03, 1945, 75 y.o.   MRN: [id number]  HPI CPE- UTD on mammogram, due for colonoscopy.  UTD on PNA vaccines, Tdap, COVID.  Reviewed past medical, surgical, family and social histories.   Patient Care Team    Relationship Specialty Notifications Start End  Midge Minium, MD PCP - General   02/10/10    Comment: Melina Copa, MD (Inactive) PCP - Cardiology Cardiology Admissions 05/24/17   Dorothy Spark, MD (Inactive) Consulting Physician Cardiology  12/16/15   Madelin Rear, [facility] Pharmacist Pharmacist Admissions 06/07/19    Comment: phone number [phone]  Marica Otter, [place] Physician Optometry  06/26/19     Health Maintenance  Topic Date Due  . Hepatitis C Screening  Never done  . Zoster Vaccines- Shingrix (1 of 2) Never done  . COLONOSCOPY (Pts 45-65yrs Insurance coverage will need to be confirmed)  08/29/2019  . INFLUENZA VACCINE  09/28/2020  . TETANUS/TDAP  08/24/2026  . Pneumococcal Vaccine 25-48 Years old  Completed  . DEXA SCAN  Completed  . COVID-19 Vaccine  Completed  . PNA vac Low Risk Adult  Completed  . HPV VACCINES  Aged Out      Review of Systems Patient reports no vision/ hearing changes, adenopathy,fever, weight change,  persistant/recurrent hoarseness , swallowing issues, chest pain, palpitations, edema, persistant/recurrent cough, hemoptysis, dyspnea (rest/exertional/paroxysmal nocturnal), gastrointestinal bleeding (melena, rectal bleeding), abdominal pain, significant heartburn, bowel changes, GU symptoms (dysuria, hematuria, incontinence), Gyn symptoms (abnormal  bleeding, pain),  syncope, focal weakness, memory loss, numbness & tingling, skin/hair/nail changes, abnormal bruising or bleeding, anxiety, or depression.   + deformity of R heel- haglund's  This visit occurred during the SARS-CoV-2 public health emergency.  Safety protocols were in place, including screening  questions prior to the visit, additional usage of staff PPE, and extensive cleaning of exam room while observing appropriate contact time as indicated for disinfecting solutions.       Objective:   Physical Exam General Appearance:    Alert, cooperative, no distress, appears stated age  Head:    Normocephalic, without obvious abnormality, atraumatic  Eyes:    PERRL, conjunctiva/corneas clear, EOM's intact, fundi    benign, both eyes  Ears:    Normal TM's and external ear canals, both ears  Nose:   Nares normal, septum midline, mucosa normal, no drainage    or sinus tenderness  Throat:   Lips, mucosa, and tongue normal; teeth and gums normal  Neck:   Supple, symmetrical, trachea midline, no adenopathy;    Thyroid: no enlargement/tenderness/nodules  Back:     Symmetric, no curvature, ROM normal, no CVA tenderness  Lungs:     Clear to auscultation bilaterally, respirations unlabored  Chest Wall:    No tenderness or deformity   Heart:    Regular rate and rhythm, S1 and S2 normal, no murmur, rub   or gallop  Breast Exam:    Deferred to mammo  Abdomen:     Soft, non-tender, bowel sounds active all four quadrants,    no masses, no organomegaly  Genitalia:    Deferred  Rectal:    Extremities:   Extremities normal, atraumatic, no cyanosis or edema  Pulses:   2+ and symmetric all extremities  Skin:   Skin color, texture, turgor normal, no rashes or lesions  Lymph nodes:   Cervical, supraclavicular, and axillary nodes normal  Neurologic:   CNII-XII intact, normal strength,  sensation and reflexes    throughout          Assessment & Plan:

## 2020-08-04 NOTE — Patient Instructions (Signed)
Follow up in 6 months to recheck BP and cholesterol We'll notify you of your lab results and make any changes if needed Continue to work on healthy diet and regular exercise- you can do it! When you get back to town, message me so we can refer for the heel We'll call you with your GI referral Call with any questions or concerns Stay Safe!  Stay Healthy! Have a great summer!

## 2020-08-04 NOTE — Assessment & Plan Note (Signed)
Pt's PE WNL.  UTD on immunizations, mammo.  Due for repeat colonoscopy.  Check labs.  Anticipatory guidance provided.

## 2020-08-04 NOTE — Assessment & Plan Note (Signed)
Chronic problem.  Adequately controlled.  Currently asymptomatic.  Check labs.  No anticipated med changes.  Will follow.

## 2020-08-05 ENCOUNTER — Other Ambulatory Visit: Payer: Self-pay

## 2020-08-05 LAB — HEPATITIS C ANTIBODY
Hepatitis C Ab: NONREACTIVE
SIGNAL TO CUT-OFF: 0 (ref <1.00)

## 2020-08-05 MED ORDER — VITAMIN D (ERGOCALCIFEROL) 1.25 MG (50000 UNIT) PO CAPS: 50000.0000 [IU] | ORAL_CAPSULE | ORAL | 0 refills | Status: AC

## 2020-08-26 ENCOUNTER — Encounter: Payer: Self-pay | Admitting: *Deleted

## 2020-10-08 ENCOUNTER — Telehealth: Payer: Self-pay

## 2020-10-08 NOTE — Progress Notes (Signed)
    Chronic Care Management Pharmacy Assistant   Name: BRENN EVANGELISTA  MRN: [id number] DOB: 1945/03/04   Reason for Encounter: Disease State - General Adherence / Schedule Follow up CPP call    Recent office visits:  08/04/20 Annye Asa, MD (PCP) - Point were ordered. Referral placed for Gastroenterology. No medication changes. Follow up in 6 months.    Recent consult visits:  None noted.    Hospital visits:  None in previous 6 months  Medications: Outpatient Encounter Medications as of 10/08/2020  Medication Sig   albuterol (PROVENTIL HFA;VENTOLIN HFA) 108 (90 Base) MCG/ACT inhaler Inhale 2 puffs into the lungs every 6 (six) hours as needed for wheezing or shortness of breath.   amLODipine (NORVASC) 10 MG tablet TAKE 1 TABLET(10 MG) BY MOUTH DAILY   Apoaequorin (PREVAGEN EXTRA STRENGTH) 20 MG CAPS Take 20 mg by mouth daily.   aspirin EC 81 MG tablet Take 1 tablet (81 mg total) by mouth daily.   fenofibrate 160 MG tablet TAKE 1 TABLET BY MOUTH DAILY   fluticasone (FLONASE) 50 MCG/ACT nasal spray SHAKE LIQUID AND USE 2 SPRAYS IN EACH NOSTRIL DAILY   hydrochlorothiazide (HYDRODIURIL) 25 MG tablet Take 1 tablet (25 mg total) by mouth daily.   levothyroxine (SYNTHROID) 88 MCG tablet TAKE 1 TABLET(88 MCG) BY MOUTH DAILY   lisinopril (ZESTRIL) 40 MG tablet Take 1 tablet (40 mg total) by mouth daily.   loratadine (CLARITIN) 10 MG tablet Take 10 mg by mouth daily.   nitroGLYCERIN (NITROSTAT) 0.4 MG SL tablet PLACE 1 TABLET UNDER TONGUE EVERY 5 MINUTES AS NEEDED FOR CHEST PAIN   pantoprazole (PROTONIX) 40 MG tablet TAKE 1 TABLET BY MOUTH DAILY AT NOON   PARoxetine (PAXIL) 10 MG tablet TAKE 1 TABLET(10 MG) BY MOUTH DAILY   potassium chloride (K-DUR) 10 MEQ tablet Take 10 mEq by mouth once.   rosuvastatin (CRESTOR) 20 MG tablet Take 1 tablet (20 mg total) by mouth daily.   Vitamin D, Ergocalciferol, (DRISDOL) 1.25 MG (50000 UNIT) CAPS capsule Take  1 capsule (50,000 Units total) by mouth every 7 (seven) days for 12 doses.   No facility-administered encounter medications on file as of 10/08/2020.   Have you had any problems recently with your health?   Have you had any problems with your pharmacy?   What issues or side effects are you having with your medications?   What would you like me to pass along to Madelin Rear, CPP for them to help you with?    What can we do to take care of you better?   Star Rating Drugs: Lisinopril (ZESTRIL) 40 MG tablet - last filled 07/31/20 90 days Rosuvastatin (CRESTOR) 20 MG tablet - last filled 07/15/20 90 days   Future Appointments  Date Time Provider [place]  05/05/2021 10:00 AM [facility]    Scheduled follow up CPP phone visit for:   Multiple attempts were made to contact patient. Attempts were unsuccessful. / ls,CMA    Jobe Gibbon, [place] Pharmacist Assistant  [phone]  Time Spent:  20 minutes

## 2020-10-13 ENCOUNTER — Other Ambulatory Visit: Payer: Self-pay | Admitting: Family Medicine

## 2020-10-13 ENCOUNTER — Other Ambulatory Visit: Payer: Self-pay | Admitting: Physician Assistant

## 2020-10-13 DIAGNOSIS — K219 Gastro-esophageal reflux disease without esophagitis: Secondary | ICD-10-CM

## 2021-01-08 ENCOUNTER — Other Ambulatory Visit: Payer: Self-pay

## 2021-01-08 DIAGNOSIS — I6523 Occlusion and stenosis of bilateral carotid arteries: Secondary | ICD-10-CM

## 2021-01-08 DIAGNOSIS — I10 Essential (primary) hypertension: Secondary | ICD-10-CM

## 2021-01-08 DIAGNOSIS — I2510 Atherosclerotic heart disease of native coronary artery without angina pectoris: Secondary | ICD-10-CM

## 2021-01-08 DIAGNOSIS — E782 Mixed hyperlipidemia: Secondary | ICD-10-CM

## 2021-01-08 MED ORDER — ROSUVASTATIN CALCIUM 20 MG PO TABS: 20.0000 mg | ORAL_TABLET | Freq: Every day | ORAL | 0 refills | Status: DC

## 2021-01-11 ENCOUNTER — Other Ambulatory Visit: Payer: Self-pay

## 2021-01-11 DIAGNOSIS — K219 Gastro-esophageal reflux disease without esophagitis: Secondary | ICD-10-CM

## 2021-01-11 DIAGNOSIS — E038 Other specified hypothyroidism: Secondary | ICD-10-CM

## 2021-01-11 DIAGNOSIS — I10 Essential (primary) hypertension: Secondary | ICD-10-CM

## 2021-01-11 MED ORDER — LEVOTHYROXINE SODIUM 88 MCG PO TABS: ORAL_TABLET | ORAL | 1 refills | Status: DC

## 2021-01-11 MED ORDER — AMLODIPINE BESYLATE 10 MG PO TABS: ORAL_TABLET | ORAL | 1 refills | Status: DC

## 2021-01-11 MED ORDER — PANTOPRAZOLE SODIUM 40 MG PO TBEC: DELAYED_RELEASE_TABLET | ORAL | 0 refills | Status: DC

## 2021-01-12 ENCOUNTER — Other Ambulatory Visit: Payer: Self-pay

## 2021-01-12 MED ORDER — HYDROCHLOROTHIAZIDE 25 MG PO TABS: 25.0000 mg | ORAL_TABLET | Freq: Every day | ORAL | 0 refills | Status: DC

## 2021-01-19 ENCOUNTER — Other Ambulatory Visit: Payer: Self-pay

## 2021-01-19 MED ORDER — PAROXETINE HCL 10 MG PO TABS: ORAL_TABLET | ORAL | 1 refills | Status: DC

## 2021-02-02 DIAGNOSIS — R059 Cough, unspecified: Secondary | ICD-10-CM | POA: Diagnosis not present

## 2021-02-02 DIAGNOSIS — J069 Acute upper respiratory infection, unspecified: Secondary | ICD-10-CM | POA: Diagnosis not present

## 2021-02-02 DIAGNOSIS — Z20822 Contact with and (suspected) exposure to covid-19: Secondary | ICD-10-CM | POA: Diagnosis not present

## 2021-04-07 ENCOUNTER — Other Ambulatory Visit: Payer: Self-pay

## 2021-04-07 ENCOUNTER — Other Ambulatory Visit: Payer: Self-pay | Admitting: Physician Assistant

## 2021-04-07 DIAGNOSIS — I2510 Atherosclerotic heart disease of native coronary artery without angina pectoris: Secondary | ICD-10-CM

## 2021-04-07 DIAGNOSIS — E782 Mixed hyperlipidemia: Secondary | ICD-10-CM

## 2021-04-07 DIAGNOSIS — I10 Essential (primary) hypertension: Secondary | ICD-10-CM

## 2021-04-07 DIAGNOSIS — I6523 Occlusion and stenosis of bilateral carotid arteries: Secondary | ICD-10-CM

## 2021-04-07 DIAGNOSIS — K219 Gastro-esophageal reflux disease without esophagitis: Secondary | ICD-10-CM

## 2021-04-07 MED ORDER — PANTOPRAZOLE SODIUM 40 MG PO TBEC: DELAYED_RELEASE_TABLET | ORAL | 0 refills | Status: DC

## 2021-05-03 ENCOUNTER — Other Ambulatory Visit: Payer: Self-pay | Admitting: *Deleted

## 2021-05-03 MED ORDER — FENOFIBRATE 160 MG PO TABS: 160.0000 mg | ORAL_TABLET | Freq: Every day | ORAL | 0 refills | Status: DC

## 2021-05-05 ENCOUNTER — Other Ambulatory Visit: Payer: Self-pay

## 2021-05-05 ENCOUNTER — Telehealth: Payer: Self-pay | Admitting: *Deleted

## 2021-05-05 ENCOUNTER — Ambulatory Visit (HOSPITAL_COMMUNITY)
Admission: RE | Admit: 2021-05-05 | Discharge: 2021-05-05 | Disposition: A | Discharge status: Home / Self Care | Payer: Medicare Other | Source: Ambulatory Visit | Attending: Cardiovascular Disease | Admitting: Cardiovascular Disease

## 2021-05-05 DIAGNOSIS — I6523 Occlusion and stenosis of bilateral carotid arteries: Secondary | ICD-10-CM

## 2021-05-05 DIAGNOSIS — E785 Hyperlipidemia, unspecified: Secondary | ICD-10-CM | POA: Diagnosis not present

## 2021-05-05 NOTE — Telephone Encounter (Signed)
The patient has been notified of the result and verbalized understanding.  All questions (if any) were answered. ? ?Pt aware we will repeat another Carotid US on her in one year for monitoring. ?Order placed and the pt is aware our [place] will call her back to arrange this appt for one year out.  ?Pt verbalized understanding and agrees with this plan. ? ?

## 2021-05-05 NOTE — Telephone Encounter (Signed)
-----   Message from Freada Bergeron, MD sent at 05/05/2021  4:29 PM EST ----- ?Carotid ultrasound looks stable from last year. RICA is 91-47%, LICA 8-29%. Will repeat ultrasound next year for monitoring.  ?

## 2021-05-07 ENCOUNTER — Other Ambulatory Visit: Payer: Self-pay | Admitting: Physician Assistant

## 2021-05-10 ENCOUNTER — Other Ambulatory Visit: Payer: Self-pay | Admitting: Physician Assistant

## 2021-05-10 DIAGNOSIS — I10 Essential (primary) hypertension: Secondary | ICD-10-CM

## 2021-05-10 DIAGNOSIS — I6523 Occlusion and stenosis of bilateral carotid arteries: Secondary | ICD-10-CM

## 2021-05-10 DIAGNOSIS — I2510 Atherosclerotic heart disease of native coronary artery without angina pectoris: Secondary | ICD-10-CM

## 2021-05-10 DIAGNOSIS — E782 Mixed hyperlipidemia: Secondary | ICD-10-CM

## 2021-05-12 ENCOUNTER — Other Ambulatory Visit: Payer: Self-pay

## 2021-05-12 DIAGNOSIS — I2510 Atherosclerotic heart disease of native coronary artery without angina pectoris: Secondary | ICD-10-CM

## 2021-05-12 DIAGNOSIS — I6523 Occlusion and stenosis of bilateral carotid arteries: Secondary | ICD-10-CM

## 2021-05-12 DIAGNOSIS — I10 Essential (primary) hypertension: Secondary | ICD-10-CM

## 2021-05-12 DIAGNOSIS — E782 Mixed hyperlipidemia: Secondary | ICD-10-CM

## 2021-05-12 MED ORDER — ROSUVASTATIN CALCIUM 20 MG PO TABS: 20.0000 mg | ORAL_TABLET | Freq: Every day | ORAL | 0 refills | Status: DC

## 2021-05-29 DIAGNOSIS — S2222XA Fracture of body of sternum, initial encounter for closed fracture: Secondary | ICD-10-CM | POA: Diagnosis not present

## 2021-05-29 DIAGNOSIS — T1490XA Injury, unspecified, initial encounter: Secondary | ICD-10-CM | POA: Diagnosis not present

## 2021-05-29 DIAGNOSIS — Z743 Need for continuous supervision: Secondary | ICD-10-CM | POA: Diagnosis not present

## 2021-05-29 DIAGNOSIS — R9431 Abnormal electrocardiogram [ECG] [EKG]: Secondary | ICD-10-CM | POA: Diagnosis not present

## 2021-05-30 ENCOUNTER — Encounter: Payer: Self-pay | Admitting: Family Medicine

## 2021-06-03 ENCOUNTER — Ambulatory Visit (INDEPENDENT_AMBULATORY_CARE_PROVIDER_SITE_OTHER): Payer: Medicare Other | Admitting: Family Medicine

## 2021-06-03 ENCOUNTER — Encounter: Payer: Self-pay | Admitting: Family Medicine

## 2021-06-03 DIAGNOSIS — S2220XD Unspecified fracture of sternum, subsequent encounter for fracture with routine healing: Secondary | ICD-10-CM

## 2021-06-03 DIAGNOSIS — E038 Other specified hypothyroidism: Secondary | ICD-10-CM

## 2021-06-03 DIAGNOSIS — I10 Essential (primary) hypertension: Secondary | ICD-10-CM

## 2021-06-03 DIAGNOSIS — K219 Gastro-esophageal reflux disease without esophagitis: Secondary | ICD-10-CM | POA: Diagnosis not present

## 2021-06-03 MED ORDER — PANTOPRAZOLE SODIUM 40 MG PO TBEC: DELAYED_RELEASE_TABLET | ORAL | 1 refills | Status: DC

## 2021-06-03 MED ORDER — PAROXETINE HCL 10 MG PO TABS: ORAL_TABLET | ORAL | 1 refills | Status: DC

## 2021-06-03 MED ORDER — LEVOTHYROXINE SODIUM 88 MCG PO TABS: ORAL_TABLET | ORAL | 1 refills | Status: DC

## 2021-06-03 MED ORDER — LISINOPRIL 40 MG PO TABS: 40.0000 mg | ORAL_TABLET | Freq: Every day | ORAL | 1 refills | Status: DC

## 2021-06-03 MED ORDER — AMLODIPINE BESYLATE 10 MG PO TABS: ORAL_TABLET | ORAL | 1 refills | Status: DC

## 2021-06-03 MED ORDER — METHOCARBAMOL 500 MG PO TABS: 500.0000 mg | ORAL_TABLET | Freq: Three times a day (TID) | ORAL | 0 refills | Status: AC | PRN
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
## 2021-06-03 NOTE — Patient Instructions (Addendum)
Schedule your complete physical in September ?Continue the ibuprofen, tylenol, gabapentin, and methocarbamol as needed/directed ?Use ice or heat- whichever feels better ?Call with any questions or concerns ?Stay Safe!  Stay Healthy! ?Hang in there!!! ?

## 2021-06-07 NOTE — Progress Notes (Deleted)
?Cardiology Office Note:   ? ?Date:  06/07/2021  ? ?ID:  Ann Wagner, DOB 1945-09-07, MRN [id number] ? ?PCP:  Midge Minium, MD ?  ?[facility] Providers ?Cardiologist:  Ena Dawley, MD { ? ?Referring MD: Midge Minium, MD  ? ?History of Present Illness:   ? ?Ann Wagner is a 76 y.o. female with a hx of CAD s/p LAD PCI in 2016, HTN, HLD, bilateral carotid artery disease, and vasovagal syncope who was previously followed by Dr. Meda Coffee who now presents to clinic for follow-up. ? ?Per review of the record, the patient has history of abnormal GXT in 2016 which led to cardiac CTA which showed diffuse prox-to-mid LAD stenosis with Ca score 580. She underwent successful PCI to mid-LAD in 12/2014. Had residual mild prox RCA 20% stenosis at that time. ? ?TTE in 02/2016 showed LVEF 65-70%, no WMA, trivial AI. Myoview 11/2018 with normal perfusion and normal LVEF. Cardiac monitor 12/2018 with no arrhythmias. ? ?Last seen by Truitt Merle, NP on 03/2020 where she was doing well from a CV standpoint. ? ?Today, *** ? ?Past Medical History:  ?Diagnosis Date  ? Allergic rhinitis   ? Arthritis   ? Coronary artery disease   ? cath 01/12/2015 single vessel CAD, DES to mid LAD  ? GERD (gastroesophageal reflux disease)   ? Headache   ? History of chicken pox   ? Hyperlipidemia   ? Hypertension   ? Hyperthyroidism   ? Syncope   ? ? ?Past Surgical History:  ?Procedure Laterality Date  ? CARDIAC CATHETERIZATION N/A 01/12/2015  ? Procedure: Left Heart Cath and Coronary Angiography;  Surgeon: Peter M Martinique, MD;  Location: [facility];  Service: Cardiovascular;  Laterality: N/A;  ? CARDIAC CATHETERIZATION Right 01/12/2015  ? Procedure: Coronary Stent Intervention;  Surgeon: Peter M Martinique, MD;  Location: [facility];  Service: Cardiovascular;  Laterality: Right;  ? CORONARY STENT PLACEMENT  01/12/2015  ? des to lad  ? TUBAL LIGATION    ? ? ?Current Medications: ?No outpatient medications have been marked as  taking for the 06/15/21 encounter (Appointment) with Freada Bergeron, MD.  ?  ? ?Allergies:   Propoxyphene n-acetaminophen  ? ?Social History  ? ?Socioeconomic History  ? Marital status: Divorced  ?  Spouse name: Not on file  ? Number of children: Not on file  ? Years of education: Not on file  ? Highest education level: Not on file  ?Occupational History  ? Not on file  ?Tobacco Use  ? Smoking status: Former  ? Smokeless tobacco: Never  ?Vaping Use  ? Vaping Use: Never used  ?Substance and Sexual Activity  ? Alcohol use: Yes  ?  Comment: socially  ? Drug use: No  ? Sexual activity: Not on file  ?Other Topics Concern  ? Not on file  ?Social History Narrative  ? Lives alone.  ? 2 sons - 1 locally with 5 kids.  ? Retired Nurse, learning disability, enjoys antique shows.  ? ?Social Determinants of Health  ? ?Financial Resource Strain: Not on file  ?Food Insecurity: Not on file  ?Transportation Needs: Not on file  ?Physical Activity: Not on file  ?Stress: Not on file  ?Social Connections: Not on file  ?  ? ?Family History: ?The patient's ***family history includes Coronary artery disease in her father, mother, and paternal grandmother; Diabetes in her mother; Heart attack in her mother; Heart disease in her son; Hypertension in her father, mother,  paternal grandmother, and son. There is no history of Stroke or Cancer. ? ?ROS:   ?Please see the history of present illness.    ?*** All other systems reviewed and are negative. ? ?EKGs/Labs/Other Studies Reviewed:   ? ?The following studies were reviewed today: ?NST 12/07/2018 ?Study Highlights ?  ?  ?Nuclear stress EF: 68%. ?The study is normal. ?This is a low risk study. ?There was no ST segment deviation noted during stress. ?No T wave inversion was noted during stress. ?  ?Low risk stress nuclear study with normal perfusion and normal left ventricular regional and global systolic function.  ?  ?2 week monitor 12/18/18 ?Summary: ?The patient's monitoring period was 12/18/2018 -  12/31/2018. Baseline sample showed Sinus Bradycardia with a heart rate of 50.1 bpm. There were 0 critical, ?0 serious, and 19 stable events that occurred. The report analysis of the critical, serious, stable and manually triggered events are listed below. ?Manually Detected Events: ?2 Stable: Sinus Bradycardia w/Artifact ? None or Accidental Push ?1 Stable: Sinus Bradycardia w/Artifact/Lead Loss ? Flutter or Skipped Beats ?1 Stable: Sinus Rhythm w/Artifact/Lead Loss ? None or Accidental Push ?1 Stable: Sinus Bradycardia, Sinus Rhythm w/Atrial Run/Artifact ? Flutter or Skipped Beats ?2 Stable: Sinus Bradycardia w/Artifact/Lead Loss ? None or Accidental Push ?Automatically Detected Events: ?2 Stable: ? ?EKG:  EKG is *** ordered today.  The ekg ordered today demonstrates *** ? ?Recent Labs: ?08/04/2020: ALT 16; BUN 31; Creatinine, Ser 0.89; Hemoglobin 14.2; Platelets 276.0; Potassium 4.3; Sodium 140; TSH 2.51  ?Recent Lipid Panel ?   ?Component Value Date/Time  ? CHOL 144 08/04/2020 0805  ? CHOL 144 04/15/2020 0944  ? TRIG 120.0 08/04/2020 0805  ? HDL 49.30 08/04/2020 0805  ? HDL 53 04/15/2020 0944  ? CHOLHDL 3 08/04/2020 0805  ? VLDL 24.0 08/04/2020 0805  ? [place] 70 08/04/2020 0805  ? [place] 76 04/15/2020 0944  ? LDLDIRECT 92.9 12/20/2011 0924  ? ? ? ?Risk Assessment/Calculations:   ?{Does this patient have ATRIAL FIBRILLATION?:[id number]} ? ?    ? ?Physical Exam:   ? ?VS:  There were no vitals taken for this visit.   ? ?Wt Readings from Last 3 Encounters:  ?06/03/21 172 lb 6.4 oz (78.2 kg)  ?08/04/20 174 lb 12.8 oz (79.3 kg)  ?04/15/20 171 lb 9.6 oz (77.8 kg)  ?  ? ?GEN: *** Well nourished, well developed in no acute distress ?HEENT: Normal ?NECK: No JVD; No carotid bruits ?LYMPHATICS: No lymphadenopathy ?CARDIAC: ***RRR, no murmurs, rubs, gallops ?RESPIRATORY:  Clear to auscultation without rales, wheezing or rhonchi  ?ABDOMEN: Soft, non-tender, non-distended ?MUSCULOSKELETAL:  No edema; No deformity  ?SKIN: Warm  and dry ?NEUROLOGIC:  Alert and oriented x 3 ?PSYCHIATRIC:  Normal affect  ? ?ASSESSMENT:   ? ?No diagnosis found. ?PLAN:   ? ?In order of problems listed above: ? ?#CAD with history of PCI to LAD: ?Patient with history of mid-LAD PCI in 2016. Last stress test in 2020 without evidence of ischemia and normal EF. Will continue with medical management. ?-Continue ASA '81mg'$  daily ?-Continue crestor '20mg'$  daily ?-Continue lisinopril '40mg'$  daily ?-Not on BB due to baseline bradycardia ? ?#HTN: ?-Continue lisinopril '40mg'$  daily ?-Continue HCTZ '25mg'$  daily ?-Continue amlodipine '10mg'$  daily ? ?#HLD: ?-Continue crestor '20mg'$  daily ? ?#Chronic asymptomatic bradycardia: ?-Stable and asymptomatic ? ?#Bilateral Carotid Artery Disease: ?-Continue ASA '81mg'$  daily ?-Continue crestor '20mg'$  daily ?   ? ?{Are you ordering a CV Procedure (e.g. stress test, cath, DCCV, TEE, etc)?   Press F2        :  [id number]}  ? ? ?Medication Adjustments/Labs and Tests Ordered: ?Current medicines are reviewed at length with the patient today.  Concerns regarding medicines are outlined above.  ?No orders of the defined types were placed in this encounter. ? ?No orders of the defined types were placed in this encounter. ? ? ?There are no Patient Instructions on file for this visit.  ? ?Signed, ?Freada Bergeron, MD  ?06/07/2021 8:06 PM    ?[facility] ?

## 2021-06-11 ENCOUNTER — Telehealth: Payer: Self-pay | Admitting: Family Medicine

## 2021-06-11 DIAGNOSIS — M79604 Pain in right leg: Secondary | ICD-10-CM

## 2021-06-11 NOTE — Telephone Encounter (Signed)
Pt would like a referral sent for ortho. Pt last appointment was on 4/6. Please advise.  ?
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
## 2021-06-14 NOTE — Telephone Encounter (Signed)
If cardiology does not want to do the ortho referral, I am fine w/ placing one.  We just need to know which leg is bothering her and if she has a preference which Ortho group she sees ?

## 2021-06-15 ENCOUNTER — Ambulatory Visit: Payer: Medicare Other | Admitting: Cardiology

## 2021-06-15 ENCOUNTER — Other Ambulatory Visit: Payer: Self-pay

## 2021-06-15 ENCOUNTER — Encounter: Payer: Self-pay | Admitting: Cardiology

## 2021-06-15 VITALS — BP 164/80 | HR 51 | Ht 65.0 in | Wt 174.0 lb

## 2021-06-15 DIAGNOSIS — I6523 Occlusion and stenosis of bilateral carotid arteries: Secondary | ICD-10-CM

## 2021-06-15 DIAGNOSIS — E782 Mixed hyperlipidemia: Secondary | ICD-10-CM | POA: Diagnosis not present

## 2021-06-15 DIAGNOSIS — I2510 Atherosclerotic heart disease of native coronary artery without angina pectoris: Secondary | ICD-10-CM | POA: Diagnosis not present

## 2021-06-15 DIAGNOSIS — Z79899 Other long term (current) drug therapy: Secondary | ICD-10-CM

## 2021-06-15 DIAGNOSIS — R001 Bradycardia, unspecified: Secondary | ICD-10-CM | POA: Diagnosis not present

## 2021-06-15 DIAGNOSIS — R079 Chest pain, unspecified: Secondary | ICD-10-CM

## 2021-06-15 DIAGNOSIS — I10 Essential (primary) hypertension: Secondary | ICD-10-CM | POA: Diagnosis not present

## 2021-06-15 MED ORDER — NITROGLYCERIN 0.4 MG SL SUBL: SUBLINGUAL_TABLET | SUBLINGUAL | 3 refills | Status: AC

## 2021-06-15 MED ORDER — FENOFIBRATE 160 MG PO TABS: 160.0000 mg | ORAL_TABLET | Freq: Every day | ORAL | 3 refills | Status: DC

## 2021-06-15 MED ORDER — SPIRONOLACTONE 25 MG PO TABS: 25.0000 mg | ORAL_TABLET | Freq: Every day | ORAL | 2 refills | Status: DC
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
## 2021-06-15 NOTE — Patient Instructions (Signed)
Medication Instructions:  ? ?PLEASE START TAKING SPIRONOLACTONE 25 MG BY MOUTH DAILY IF YOUR BLOOD PRESSURES ARE STILL RUNNING HIGH AT HOME ? ?*If you need a refill on your cardiac medications before your next appointment, please call your pharmacy* ? ? ?Lab Work: ? ?IF YOU START TAKING SPIRONOLACTONE BASED ON YOUR HOME BP READINGS, PLEASE CALL us BACK AND LET us KNOW SO WE CAN SCHEDULE YOU TO COME IN AND HAVE LAB WORK TO CHECK A BMET, ONE WEEK AFTER STARTING THE MEDICATION ? ?If you have labs (blood work) drawn today and your tests are completely normal, you will receive your results only by: ?MyChart Message (if you have MyChart) OR ?A paper copy in the mail ?If you have any lab test that is abnormal or we need to change your treatment, we will call you to review the results. ? ? ?Testing/Procedures: ? ?Your physician has requested that you have an echocardiogram. Echocardiography is a painless test that uses sound waves to create images of your heart. It provides your doctor with information about the size and shape of your heart and how well your heart?s chambers and valves are working. This procedure takes approximately one hour. There are no restrictions for this procedure. ? ? ? ?Follow-Up: ?At [facility], you and your health needs are our priority.  As part of our continuing mission to provide you with exceptional heart care, we have created designated Provider Care Teams.  These Care Teams include your primary Cardiologist (physician) and Advanced Practice Providers (APPs -  Physician Assistants and Nurse Practitioners) who all work together to provide you with the care you need, when you need it. ? ?We recommend signing up for the patient portal called "MyChart".  Sign up information is provided on this After Visit Summary.  MyChart is used to connect with patients for Virtual Visits (Telemedicine).  Patients are able to view lab/test results, encounter notes, upcoming appointments, etc.  Non-urgent  messages can be sent to your provider as well.   ?To learn more about what you can do with MyChart, go to [email or web address].   ? ?Your next appointment:   ?6 month(s) ? ?The format for your next appointment:   ?In Person ? ?Provider:   ?DR. PEMBERTON ? ?Other Instructions ? ?PLEASE START MONITORING YOUR BLOOD PRESSURES AT HOME--IF THEY ARE STILL ELEVATED, PLEASE START TAKING SPIRONOLACTONE 25 MG BY MOUTH DAILY, THEN CALL us TO LET us KNOW YOU STARTED THIS MED SO WE CAN SCHEDULE YOU TO COME IN FOR LAB WORK IN ONE WEEK THEREAFTER. ? ?Important Information About Sugar ? ? ? ? ? ? ?

## 2021-06-15 NOTE — Telephone Encounter (Signed)
Since I don't have the documentation required for insurance to order an xray, I will refer to Sports Medicine for a complete evaluation and they have xray on site. ?
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
## 2021-06-16 NOTE — Telephone Encounter (Signed)
Attempted to call patient to let her know. No answer and vm full ?

## 2021-06-17 ENCOUNTER — Encounter: Payer: Self-pay | Admitting: Cardiology

## 2021-06-17 ENCOUNTER — Ambulatory Visit: Payer: Self-pay

## 2021-06-17 ENCOUNTER — Telehealth: Payer: Self-pay | Admitting: Family Medicine

## 2021-06-17 ENCOUNTER — Ambulatory Visit (INDEPENDENT_AMBULATORY_CARE_PROVIDER_SITE_OTHER): Payer: Medicare Other

## 2021-06-17 ENCOUNTER — Ambulatory Visit (INDEPENDENT_AMBULATORY_CARE_PROVIDER_SITE_OTHER): Payer: Medicare Other | Admitting: Family Medicine

## 2021-06-17 VITALS — BP 172/82 | HR 45 | Ht 65.0 in | Wt 173.2 lb

## 2021-06-17 DIAGNOSIS — M79605 Pain in left leg: Secondary | ICD-10-CM | POA: Diagnosis not present

## 2021-06-17 DIAGNOSIS — S99912A Unspecified injury of left ankle, initial encounter: Secondary | ICD-10-CM | POA: Diagnosis not present

## 2021-06-17 DIAGNOSIS — M7989 Other specified soft tissue disorders: Secondary | ICD-10-CM

## 2021-06-17 DIAGNOSIS — I10 Essential (primary) hypertension: Secondary | ICD-10-CM

## 2021-06-17 DIAGNOSIS — M79672 Pain in left foot: Secondary | ICD-10-CM | POA: Diagnosis not present

## 2021-06-17 DIAGNOSIS — Z79899 Other long term (current) drug therapy: Secondary | ICD-10-CM

## 2021-06-17 DIAGNOSIS — M25572 Pain in left ankle and joints of left foot: Secondary | ICD-10-CM | POA: Diagnosis not present

## 2021-06-17 NOTE — Telephone Encounter (Signed)
Patient is at sports medicine currently.  ? ?She wanted to see if Dr Birdie Riddle would be ok filling the hydrocodone that she received at the hospital for one more week? Also wants to know if she would approve sending in the gabapentin as well.  ?

## 2021-06-17 NOTE — Telephone Encounter (Signed)
Pt is currently in the Sports Med office and I will need to see what their plan is before deciding on medications ?

## 2021-06-17 NOTE — Telephone Encounter (Signed)
Spoke with the pt.  She will come in for repeat BMET next Wednesday 4/26. Lab appt for 4/26 to recheck BMET scheduled for the pt.  ?Pt agreed to plan. ?

## 2021-06-17 NOTE — Patient Instructions (Addendum)
Thank you for coming in today.  ? ?Please get an Xray today before you leave foot ? ?Plan for vascular ultrasound ? ?Check back in 2 weeks ?

## 2021-06-18 ENCOUNTER — Ambulatory Visit (HOSPITAL_COMMUNITY)
Admission: RE | Admit: 2021-06-18 | Discharge: 2021-06-18 | Disposition: A | Discharge status: Home / Self Care | Payer: Medicare Other | Source: Ambulatory Visit | Attending: Family Medicine | Admitting: Family Medicine

## 2021-06-18 DIAGNOSIS — M79605 Pain in left leg: Secondary | ICD-10-CM | POA: Insufficient documentation

## 2021-06-18 DIAGNOSIS — M7989 Other specified soft tissue disorders: Secondary | ICD-10-CM

## 2021-06-18 MED ORDER — GABAPENTIN 300 MG PO CAPS
300.0000 mg | ORAL_CAPSULE | Freq: Three times a day (TID) | ORAL | 0 refills | Status: DC
Start: 2021-06-18 — End: 2021-11-12

## 2021-06-18 MED ORDER — OXYCODONE HCL 5 MG PO TABS
5.0000 mg | ORAL_TABLET | ORAL | 0 refills | Status: DC | PRN
Start: 2021-06-18 — End: 2021-11-12

## 2021-06-18 NOTE — Telephone Encounter (Signed)
Prescriptions sent to pharmacy at pt request ?

## 2021-06-18 NOTE — Addendum Note (Signed)
Addended by: Midge Minium on: 06/18/2021 03:37 PM ? ? Modules accepted: Orders ? ?

## 2021-06-21 NOTE — Progress Notes (Signed)
Left foot x-ray looks normal to radiology.

## 2021-06-21 NOTE — Progress Notes (Signed)
No evidence of DVT seen on ultrasound examination.

## 2021-06-21 NOTE — Progress Notes (Signed)
Left ankle x-ray shows an old appearing avulsion fracture to the ankle.  You have 2 tiny little slivers of bone that look like they were pulled off the ankle during a prior injury. ?No new fracture is visible.

## 2021-06-22 NOTE — Assessment & Plan Note (Signed)
Refill on meds provided ?

## 2021-06-23 ENCOUNTER — Telehealth: Payer: Self-pay

## 2021-06-23 ENCOUNTER — Other Ambulatory Visit: Payer: Medicare Other | Admitting: *Deleted

## 2021-06-23 DIAGNOSIS — R079 Chest pain, unspecified: Secondary | ICD-10-CM | POA: Diagnosis not present

## 2021-06-23 DIAGNOSIS — I10 Essential (primary) hypertension: Secondary | ICD-10-CM

## 2021-06-23 DIAGNOSIS — Z79899 Other long term (current) drug therapy: Secondary | ICD-10-CM | POA: Diagnosis not present

## 2021-06-23 NOTE — Telephone Encounter (Signed)
Spoke with pt who was in the office to complete labwork.  Pt requesting to have BP checked to make sure her BP cuff is measuring correctly.  She states she has started her Spironolactone and that is why she is here today for labs.  She takes her BP at various times throughout the day.  Provided education that BP is ever changing and to obtain the most accurate BP pt should check 2 hours after morning medications, seated with both feet on the floor and arm resting at heart level.  Pt should sit for 5-10 minutes before taking BP and try not to check BP multiple times daily. ?Requested pt follow instructions as above and send in her next 5 days worth of BP readings.  Pt verbalizes understanding and agrees with current plan. ?

## 2021-06-24 LAB — BASIC METABOLIC PANEL
BUN/Creatinine Ratio: 18 (ref 12–28)
BUN: 18 mg/dL (ref 8–27)
CO2: 24 mmol/L (ref 20–29)
Calcium: 9.9 mg/dL (ref 8.7–10.3)
Chloride: 101 mmol/L (ref 96–106)
Creatinine, Ser: 1 mg/dL (ref 0.57–1.00)
Glucose: 86 mg/dL (ref 70–99)
Potassium: 4.3 mmol/L (ref 3.5–5.2)
Sodium: 141 mmol/L (ref 134–144)
eGFR: 58 mL/min/{1.73_m2} — ABNORMAL LOW (ref >59)

## 2021-06-30 ENCOUNTER — Ambulatory Visit (HOSPITAL_COMMUNITY): Payer: Medicare Other | Attending: Cardiovascular Disease

## 2021-06-30 ENCOUNTER — Ambulatory Visit (INDEPENDENT_AMBULATORY_CARE_PROVIDER_SITE_OTHER): Payer: Medicare Other | Admitting: Family Medicine

## 2021-06-30 VITALS — BP 178/76 | HR 47 | Ht 65.0 in | Wt 170.6 lb

## 2021-06-30 DIAGNOSIS — R079 Chest pain, unspecified: Secondary | ICD-10-CM | POA: Insufficient documentation

## 2021-06-30 DIAGNOSIS — M79672 Pain in left foot: Secondary | ICD-10-CM

## 2021-06-30 LAB — ECHOCARDIOGRAM COMPLETE
Area-P 1/2: 2.91 cm2
S' Lateral: 2.1 cm
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
## 2021-06-30 NOTE — Patient Instructions (Addendum)
Thank you for coming in today.   Return as needed.    

## 2021-07-02 ENCOUNTER — Other Ambulatory Visit: Payer: Self-pay | Admitting: Cardiology

## 2021-07-02 MED ORDER — HYDRALAZINE HCL 25 MG PO TABS: 25.0000 mg | ORAL_TABLET | Freq: Three times a day (TID) | ORAL | 3 refills | Status: DC

## 2021-07-02 NOTE — Addendum Note (Signed)
Addended by: Nuala Alpha on: 07/02/2021 10:43 AM ? ? Modules accepted: Orders ? ?

## 2021-07-02 NOTE — Telephone Encounter (Signed)
Referral to Pharmacist for BP management placed.  Sent the pt a message about referral and that our [facility] Scheduling team will be reaching out to her soon to arrange this appt.  ?

## 2021-07-02 NOTE — Telephone Encounter (Signed)
Pts BP Clinic appt to see Pharmacist is scheduled for 07/06/21 at 1030. ?Pt made aware of appt date and time by [facility] Scheduling dept. ?

## 2021-07-06 ENCOUNTER — Ambulatory Visit: Payer: Medicare Other | Admitting: Pharmacist Clinician (PhC)/ Clinical Pharmacy Specialist

## 2021-07-06 ENCOUNTER — Ambulatory Visit: Payer: Medicare Other

## 2021-07-06 ENCOUNTER — Encounter: Payer: Self-pay | Admitting: Pharmacist Clinician (PhC)/ Clinical Pharmacy Specialist

## 2021-07-06 DIAGNOSIS — I10 Essential (primary) hypertension: Secondary | ICD-10-CM

## 2021-07-06 MED ORDER — CHLORTHALIDONE 25 MG PO TABS: 25.0000 mg | ORAL_TABLET | Freq: Every day | ORAL | 3 refills | Status: DC

## 2021-07-06 NOTE — Patient Instructions (Signed)
In 3-4 weeks send me a MyChart message and let me know how your home BP readings are doing.   ? ?Check your blood pressure at home 3-4 days per week, twice daily,  and keep record of the readings. ? ?Take your BP meds as follows: ? Stop potassium supplement ? AM: lisinopril 40 mg, spironolactone 25 mg, hydralazine 25 mg ? Noon: hydralazine 25 mg ? PM: amlodipine 10 mg, chlorthalidone 25 mg, hydralazine 25 mg ? ?Bring all of your meds, your BP cuff and your record of home blood pressures to your next appointment.  Exercise as you?re able, try to walk approximately 30 minutes per day.  Keep salt intake to a minimum, especially watch canned and prepared boxed foods.  Eat more fresh fruits and vegetables and fewer canned items.  Avoid eating in fast food restaurants.  ? ? HOW TO TAKE YOUR BLOOD PRESSURE: ?Rest 5 minutes before taking your blood pressure. ? Don?t smoke or drink caffeinated beverages for at least 30 minutes before. ?Take your blood pressure before (not after) you eat. ?Sit comfortably with your back supported and both feet on the floor (don?t cross your legs). ?Elevate your arm to heart level on a table or a desk. ?Use the proper sized cuff. It should fit smoothly and snugly around your bare upper arm. There should be enough room to slip a fingertip under the cuff. The bottom edge of the cuff should be 1 inch above the crease of the elbow. ?Ideally, take 3 measurements at one sitting and record the average. ? ? ?
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
## 2021-07-07 ENCOUNTER — Encounter: Payer: Self-pay | Admitting: Family Medicine

## 2021-07-07 ENCOUNTER — Encounter: Payer: Self-pay | Admitting: Pharmacist Clinician (PhC)/ Clinical Pharmacy Specialist

## 2021-07-07 ENCOUNTER — Ambulatory Visit (INDEPENDENT_AMBULATORY_CARE_PROVIDER_SITE_OTHER): Payer: Medicare Other | Admitting: Family Medicine

## 2021-07-07 VITALS — BP 116/58 | HR 54 | Temp 97.2°F | Resp 16 | Ht 65.0 in | Wt 169.0 lb

## 2021-07-07 DIAGNOSIS — J0100 Acute maxillary sinusitis, unspecified: Secondary | ICD-10-CM | POA: Diagnosis not present

## 2021-07-07 MED ORDER — PREDNISONE 10 MG PO TABS: ORAL_TABLET | ORAL | 0 refills | Status: DC

## 2021-07-07 NOTE — Progress Notes (Signed)
? ?  Subjective:  ? ? Patient ID: Ann Wagner, female    DOB: Oct 27, 1945, 76 y.o.   MRN: [id number] ? ?HPI ?URI- 'sinus infection'.  Pt reports watery eyes, 'which always activates the sinus thing'.  + nasal congestion, ear fullness, PND, 'scratchy throat'.  No fevers.  + sinus pressure/headache.  Denies facial pain.  No tooth pain.  Sxs started 3-4 days ago.  Taking Claritin daily 'all year long'.   ? ? ?Review of Systems ?For ROS see HPI  ?   ?Objective:  ? Physical Exam ?Vitals reviewed.  ?Constitutional:   ?   General: She is not in acute distress. ?   Appearance: Normal appearance. She is well-developed. She is not ill-appearing.  ?HENT:  ?   Head: Normocephalic and atraumatic.  ?   Right Ear: Tympanic membrane and ear canal normal.  ?   Left Ear: Tympanic membrane and ear canal normal.  ?   Nose: Mucosal edema and rhinorrhea present.  ?   Right Sinus: No maxillary sinus tenderness or frontal sinus tenderness.  ?   Left Sinus: No maxillary sinus tenderness or frontal sinus tenderness.  ?   Mouth/Throat:  ?   Pharynx: Posterior oropharyngeal erythema (w/ PND) present.  ?Eyes:  ?   Extraocular Movements: Extraocular movements intact.  ?   Conjunctiva/sclera: Conjunctivae normal.  ?   Pupils: Pupils are equal, round, and reactive to light.  ?Cardiovascular:  ?   Rate and Rhythm: Normal rate and regular rhythm.  ?   Heart sounds: Normal heart sounds.  ?Pulmonary:  ?   Effort: Pulmonary effort is normal. No respiratory distress.  ?   Breath sounds: Normal breath sounds. No wheezing or rales.  ?Musculoskeletal:  ?   Cervical back: Normal range of motion and neck supple.  ?Lymphadenopathy:  ?   Cervical: No cervical adenopathy.  ?Skin: ?   General: Skin is warm and dry.  ?Neurological:  ?   General: No focal deficit present.  ?   Mental Status: She is alert and oriented to person, place, and time.  ?Psychiatric:     ?   Mood and Affect: Mood normal.     ?   Behavior: Behavior normal.     ?   Thought Content: Thought  content normal.  ? ? ? ? ? ?   ?Assessment & Plan:  ?Maxillary sinusitis- pt w/o sxs of bacterial infxn.  Her current sxs consistent w/ allergy inflammation.  Taking Claritin daily.  Encouraged addition of OTC Flonase and Pataday.  Will start low dose Prednisone taper to improve inflammation and minimize sxs.  Pt expressed understanding and is in agreement w/ plan.  ? ?

## 2021-07-07 NOTE — Patient Instructions (Signed)
Follow up as needed or as scheduled ?START the Prednisone as directed- 3 at the same time x3 days, and then 2 at the same time x3 days, and then 1 tab daily x3 days.  Take w/ food ?Continue daily Claritin or Zyrtec.  You can increase to twice daily if the symptoms are severe ?Drink LOTS of water ?You can add OTC Flonase and/or Pataday (for eyes) ?Call with any questions or concerns ?Hang in there! ?Happy Mother's Day!!! ?

## 2021-07-07 NOTE — Assessment & Plan Note (Signed)
Patient with resistant hypertension, still not to BP goal despite 5 medications.  Will have her stop hydrochlorothiazide and start chlorthalidone 25 mg daily instead.  Will also adjust time of day for medications, with lisinopril and spironolactone in the mornings and amlodipine and chlorthalidone at night.  She will continue to use the hydralazine three times daily, but hopefully we can get her pressure down and soon eliminate the mid-day dose, for convenience.   Will have her send a message in My Chart in 3-4 weeks to let us know if this is working.  ?

## 2021-07-08 ENCOUNTER — Other Ambulatory Visit: Payer: Self-pay

## 2021-07-08 DIAGNOSIS — E782 Mixed hyperlipidemia: Secondary | ICD-10-CM

## 2021-07-08 DIAGNOSIS — I6523 Occlusion and stenosis of bilateral carotid arteries: Secondary | ICD-10-CM

## 2021-07-08 DIAGNOSIS — I2510 Atherosclerotic heart disease of native coronary artery without angina pectoris: Secondary | ICD-10-CM

## 2021-07-08 DIAGNOSIS — I10 Essential (primary) hypertension: Secondary | ICD-10-CM

## 2021-07-08 MED ORDER — ROSUVASTATIN CALCIUM 20 MG PO TABS: 20.0000 mg | ORAL_TABLET | Freq: Every day | ORAL | 3 refills | Status: DC

## 2021-07-10 ENCOUNTER — Encounter: Payer: Self-pay | Admitting: Pharmacist Clinician (PhC)/ Clinical Pharmacy Specialist

## 2021-07-14 ENCOUNTER — Telehealth: Payer: Self-pay | Admitting: Family Medicine

## 2021-07-14 NOTE — Telephone Encounter (Signed)
Attempted to call the pt back . No answer and VM is full unable to leave a message .  ?

## 2021-07-14 NOTE — Telephone Encounter (Signed)
Pt called in stating that she is still having the fullness in her ears and heaviness in her head. She did complete the prednisone. She wanted to know if an antibiotic can be sent in for her to the   [facility] on [address]. ? ?She states that she is leaving to go out of town in 2 days and she saw [name] last week.  ?

## 2021-07-14 NOTE — Telephone Encounter (Signed)
Spoke w/ pt and advised pt that we can not call in an antibiotic ? hat she would need to be seen in office . ?

## 2021-07-15 ENCOUNTER — Ambulatory Visit (INDEPENDENT_AMBULATORY_CARE_PROVIDER_SITE_OTHER): Payer: Medicare Other | Admitting: Family Medicine

## 2021-07-15 ENCOUNTER — Encounter: Payer: Self-pay | Admitting: Family Medicine

## 2021-07-15 VITALS — BP 122/60 | HR 53 | Temp 98.8°F | Resp 16 | Ht 65.0 in | Wt 167.8 lb

## 2021-07-15 DIAGNOSIS — J0100 Acute maxillary sinusitis, unspecified: Secondary | ICD-10-CM | POA: Diagnosis not present

## 2021-07-15 MED ORDER — AMOXICILLIN 875 MG PO TABS: 875.0000 mg | ORAL_TABLET | Freq: Two times a day (BID) | ORAL | 0 refills | Status: AC

## 2021-07-15 NOTE — Patient Instructions (Signed)
Follow up as needed or as scheduled START the Amoxicillin twice daily- take w/ food Drink LOTS of fluids to rinse the drainage off the throat Continue daily allergy medication to help w/ congestion Delsym or Robitussin or Mucinex DM for cough Call with any questions or concerns Hang in there!!!

## 2021-07-15 NOTE — Progress Notes (Signed)
   Subjective:    Patient ID: Ann Wagner, female    DOB: 06/16/1945, 76 y.o.   MRN: [id number]  HPI Ear pain- pt was seen on 5/10 and prescribed prednisone for sinus inflammation.  Pt reports Prednisone was effective 'for 2 days'.  'i need an abx'.  + HA, bilateral ear fullness, PND, nasal congestion, coughing, sinus pain.  No tooth pain.  No fevers.  No dizziness, N/V.  Is leaving town tomorrow and wants medication.   Review of Systems For ROS see HPI     Objective:   Physical Exam Vitals reviewed.  Constitutional:      General: She is not in acute distress.    Appearance: Normal appearance. She is well-developed. She is not ill-appearing.  HENT:     Head: Normocephalic and atraumatic.     Right Ear: Tympanic membrane normal.     Left Ear: Tympanic membrane normal.     Nose: Mucosal edema and rhinorrhea present.     Right Sinus: Maxillary sinus tenderness and frontal sinus tenderness present.     Left Sinus: Maxillary sinus tenderness and frontal sinus tenderness present.     Mouth/Throat:     Pharynx: Uvula midline. Posterior oropharyngeal erythema present. No oropharyngeal exudate.  Eyes:     Conjunctiva/sclera: Conjunctivae normal.     Pupils: Pupils are equal, round, and reactive to light.  Cardiovascular:     Rate and Rhythm: Normal rate and regular rhythm.     Heart sounds: Normal heart sounds.  Pulmonary:     Effort: Pulmonary effort is normal. No respiratory distress.     Breath sounds: Normal breath sounds. No wheezing.  Musculoskeletal:     Cervical back: Normal range of motion and neck supple.  Lymphadenopathy:     Cervical: No cervical adenopathy.  Skin:    General: Skin is warm and dry.  Neurological:     General: No focal deficit present.     Mental Status: She is alert and oriented to person, place, and time.     Cranial Nerves: No cranial nerve deficit.     Motor: No weakness.     Coordination: Coordination normal.  Psychiatric:        Mood and  Affect: Mood normal.        Behavior: Behavior normal.        Thought Content: Thought content normal.          Assessment & Plan:  Acute maxillary sinusitis- pt has hx of similar.  Start Amoxicillin '875mg'$  BID.  Reviewed supportive care and red flags that should prompt return.  Pt expressed understanding and is in agreement w/ plan.

## 2021-09-07 ENCOUNTER — Other Ambulatory Visit: Payer: Self-pay

## 2021-09-09 ENCOUNTER — Telehealth: Payer: Self-pay

## 2021-09-09 NOTE — Telephone Encounter (Signed)
Look at phone message

## 2021-09-22 ENCOUNTER — Ambulatory Visit (INDEPENDENT_AMBULATORY_CARE_PROVIDER_SITE_OTHER): Payer: Medicare Other

## 2021-09-22 DIAGNOSIS — Z0000 Encounter for general adult medical examination without abnormal findings: Secondary | ICD-10-CM | POA: Diagnosis not present

## 2021-09-22 NOTE — Patient Instructions (Signed)
Ann Wagner , Thank you for taking time to come for your Medicare Wellness Visit. I appreciate your ongoing commitment to your health goals. Please review the following plan we discussed and let me know if I can assist you in the future.   Screening recommendations/referrals: Colonoscopy: no longer required  Mammogram: no longer required  Bone Density: 04/27/2017 Recommended yearly ophthalmology/optometry visit for glaucoma screening and checkup Recommended yearly dental visit for hygiene and checkup  Vaccinations: Influenza vaccine: completed  Pneumococcal vaccine: completed  Tdap vaccine: 08/23/2016 Shingles vaccine: will consider     Advanced directives: none   Conditions/risks identified: none   Next appointment: none    Preventive Care 51 Years and Older, Female Preventive care refers to lifestyle choices and visits with your health care provider that can promote health and wellness. What does preventive care include? A yearly physical exam. This is also called an annual well check. Dental exams once or twice a year. Routine eye exams. Ask your health care provider how often you should have your eyes checked. Personal lifestyle choices, including: Daily care of your teeth and gums. Regular physical activity. Eating a healthy diet. Avoiding tobacco and drug use. Limiting alcohol use. Practicing safe sex. Taking low-dose aspirin every day. Taking vitamin and mineral supplements as recommended by your health care provider. What happens during an annual well check? The services and screenings done by your health care provider during your annual well check will depend on your age, overall health, lifestyle risk factors, and family history of disease. Counseling  Your health care provider may ask you questions about your: Alcohol use. Tobacco use. Drug use. Emotional well-being. Home and relationship well-being. Sexual activity. Eating habits. History of falls. Memory  and ability to understand (cognition). Work and work Statistician. Reproductive health. Screening  You may have the following tests or measurements: Height, weight, and BMI. Blood pressure. Lipid and cholesterol levels. These may be checked every 5 years, or more frequently if you are over 66 years old. Skin check. Lung cancer screening. You may have this screening every year starting at age 46 if you have a 30-pack-year history of smoking and currently smoke or have quit within the past 15 years. Fecal occult blood test (FOBT) of the stool. You may have this test every year starting at age 65. Flexible sigmoidoscopy or colonoscopy. You may have a sigmoidoscopy every 5 years or a colonoscopy every 10 years starting at age 2. Hepatitis C blood test. Hepatitis B blood test. Sexually transmitted disease (STD) testing. Diabetes screening. This is done by checking your blood sugar (glucose) after you have not eaten for a while (fasting). You may have this done every 1-3 years. Bone density scan. This is done to screen for osteoporosis. You may have this done starting at age 24. Mammogram. This may be done every 1-2 years. Talk to your health care provider about how often you should have regular mammograms. Talk with your health care provider about your test results, treatment options, and if necessary, the need for more tests. Vaccines  Your health care provider may recommend certain vaccines, such as: Influenza vaccine. This is recommended every year. Tetanus, diphtheria, and acellular pertussis (Tdap, Td) vaccine. You may need a Td booster every 10 years. Zoster vaccine. You may need this after age 45. Pneumococcal 13-valent conjugate (PCV13) vaccine. One dose is recommended after age 45. Pneumococcal polysaccharide (PPSV23) vaccine. One dose is recommended after age 83. Talk to your health care provider about which screenings and  vaccines you need and how often you need them. This  information is not intended to replace advice given to you by your health care provider. Make sure you discuss any questions you have with your health care provider. Document Released: 03/13/2015 Document Revised: 11/04/2015 Document Reviewed: 12/16/2014 Elsevier Interactive Patient Education  2017 [employer] Prevention in the Home Falls can cause injuries. They can happen to people of all ages. There are many things you can do to make your home safe and to help prevent falls. What can I do on the outside of my home? Regularly fix the edges of walkways and driveways and fix any cracks. Remove anything that might make you trip as you walk through a door, such as a raised step or threshold. Trim any bushes or trees on the path to your home. Use bright outdoor lighting. Clear any walking paths of anything that might make someone trip, such as rocks or tools. Regularly check to see if handrails are loose or broken. Make sure that both sides of any steps have handrails. Any raised decks and porches should have guardrails on the edges. Have any leaves, snow, or ice cleared regularly. Use sand or salt on walking paths during winter. Clean up any spills in your garage right away. This includes oil or grease spills. What can I do in the bathroom? Use night lights. Install grab bars by the toilet and in the tub and shower. Do not use towel bars as grab bars. Use non-skid mats or decals in the tub or shower. If you need to sit down in the shower, use a plastic, non-slip stool. Keep the floor dry. Clean up any water that spills on the floor as soon as it happens. Remove soap buildup in the tub or shower regularly. Attach bath mats securely with double-sided non-slip rug tape. Do not have throw rugs and other things on the floor that can make you trip. What can I do in the bedroom? Use night lights. Make sure that you have a light by your bed that is easy to reach. Do not use any sheets or  blankets that are too big for your bed. They should not hang down onto the floor. Have a firm chair that has side arms. You can use this for support while you get dressed. Do not have throw rugs and other things on the floor that can make you trip. What can I do in the kitchen? Clean up any spills right away. Avoid walking on wet floors. Keep items that you use a lot in easy-to-reach places. If you need to reach something above you, use a strong step stool that has a grab bar. Keep electrical cords out of the way. Do not use floor polish or wax that makes floors slippery. If you must use wax, use non-skid floor wax. Do not have throw rugs and other things on the floor that can make you trip. What can I do with my stairs? Do not leave any items on the stairs. Make sure that there are handrails on both sides of the stairs and use them. Fix handrails that are broken or loose. Make sure that handrails are as long as the stairways. Check any carpeting to make sure that it is firmly attached to the stairs. Fix any carpet that is loose or worn. Avoid having throw rugs at the top or bottom of the stairs. If you do have throw rugs, attach them to the floor with carpet tape. Make  sure that you have a light switch at the top of the stairs and the bottom of the stairs. If you do not have them, ask someone to add them for you. What else can I do to help prevent falls? Wear shoes that: Do not have high heels. Have rubber bottoms. Are comfortable and fit you well. Are closed at the toe. Do not wear sandals. If you use a stepladder: Make sure that it is fully opened. Do not climb a closed stepladder. Make sure that both sides of the stepladder are locked into place. Ask someone to hold it for you, if possible. Clearly mark and make sure that you can see: Any grab bars or handrails. First and last steps. Where the edge of each step is. Use tools that help you move around (mobility aids) if they are  needed. These include: Canes. Walkers. Scooters. Crutches. Turn on the lights when you go into a dark area. Replace any light bulbs as soon as they burn out. Set up your furniture so you have a clear path. Avoid moving your furniture around. If any of your floors are uneven, fix them. If there are any pets around you, be aware of where they are. Review your medicines with your doctor. Some medicines can make you feel dizzy. This can increase your chance of falling. Ask your doctor what other things that you can do to help prevent falls. This information is not intended to replace advice given to you by your health care provider. Make sure you discuss any questions you have with your health care provider. Document Released: 12/11/2008 Document Revised: 07/23/2015 Document Reviewed: 03/21/2014 Elsevier Interactive Patient Education  2017 [employer].

## 2021-09-22 NOTE — Progress Notes (Signed)
Subjective:   Ann Wagner is a 76 y.o. female who presents for Medicare Annual (Subsequent) preventive examination.   I connected with Ann Wagner  today by telephone and verified that I am speaking with the correct person using two identifiers. Location patient: home Location provider: work Persons participating in the virtual visit: patient, provider.   I discussed the limitations, risks, security and privacy concerns of performing an evaluation and management service by telephone and the availability of in person appointments. I also discussed with the patient that there may be a patient responsible charge related to this service. The patient expressed understanding and verbally consented to this telephonic visit.    Interactive audio and video telecommunications were attempted between this provider and patient, however failed, due to patient having technical difficulties OR patient did not have access to video capability.  We continued and completed visit with audio only.    Review of Systems     Cardiac Risk Factors include: advanced age (>37mn, >>84women)     Objective:    Today's Vitals   There is no height or weight on file to calculate BMI.     09/22/2021    2:47 PM 06/26/2019    9:52 AM 04/18/2018   10:29 AM 04/05/2017    2:08 PM 12/16/2015    9:25 AM 01/12/2015    6:05 AM  Advanced Directives  Does Patient Have a Medical Advance Directive? Yes Yes Yes Yes Yes No;Yes  Type of AParamed[place]of [place] Land[place] will Living will;Healthcare Power of [place] will;[place]care Power of [place] Does patient want to make changes to medical advance directive?  No - Patient declined   No - Patient declined No - Patient declined  Copy of [place] Chart? No - copy requested No - copy requested No - copy requested No - copy  requested No - copy requested No - copy requested    Current Medications (verified) Outpatient Encounter Medications as of 09/22/2021  Medication Sig   albuterol (PROVENTIL HFA;VENTOLIN HFA) 108 (90 Base) MCG/ACT inhaler Inhale 2 puffs into the lungs every 6 (six) hours as needed for wheezing or shortness of breath.   amLODipine (NORVASC) 10 MG tablet TAKE 1 TABLET(10 MG) BY MOUTH DAILY   Apoaequorin (PREVAGEN EXTRA STRENGTH) 20 MG CAPS Take 20 mg by mouth daily.   aspirin EC 81 MG tablet Take 1 tablet (81 mg total) by mouth daily.   chlorthalidone (HYGROTON) 25 MG tablet Take 1 tablet (25 mg total) by mouth daily.   fenofibrate 160 MG tablet Take 1 tablet (160 mg total) by mouth daily.   fluticasone (FLONASE) 50 MCG/ACT nasal spray SHAKE LIQUID AND USE 2 SPRAYS IN EACH NOSTRIL DAILY   gabapentin (NEURONTIN) 300 MG capsule Take 1 capsule (300 mg total) by mouth 3 (three) times daily.   hydrALAZINE (APRESOLINE) 25 MG tablet Take 1 tablet (25 mg total) by mouth 3 (three) times daily.   levothyroxine (SYNTHROID) 88 MCG tablet TAKE 1 TABLET(88 MCG) BY MOUTH DAILY   lisinopril (ZESTRIL) 40 MG tablet Take 1 tablet (40 mg total) by mouth daily.   loratadine (CLARITIN) 10 MG tablet Take 10 mg by mouth daily.   methocarbamol (ROBAXIN) 500 MG tablet Take 1 tablet (500 mg total) by mouth every 8 (eight) hours as needed for muscle spasms.   nitroGLYCERIN (NITROSTAT) 0.4 MG SL tablet PLACE 1 TABLET  UNDER TONGUE EVERY 5 MINUTES AS NEEDED FOR CHEST PAIN   pantoprazole (PROTONIX) 40 MG tablet TAKE 1 TABLET BY MOUTH DAILY AT NOON   PARoxetine (PAXIL) 10 MG tablet TAKE 1 TABLET(10 MG) BY MOUTH DAILY   potassium chloride (K-DUR) 10 MEQ tablet Take 10 mEq by mouth once.   rosuvastatin (CRESTOR) 20 MG tablet Take 1 tablet (20 mg total) by mouth daily.   spironolactone (ALDACTONE) 25 MG tablet Take 1 tablet (25 mg total) by mouth daily.   oxyCODONE (ROXICODONE) 5 MG immediate release tablet Take 1 tablet (5 mg  total) by mouth every 4 (four) hours as needed for severe pain.   predniSONE (DELTASONE) 10 MG tablet 3 tabs x3 days and then 2 tabs x3 days and then 1 tab x3 days.  Take w/ food.   No facility-administered encounter medications on file as of 09/22/2021.    Allergies (verified) Propoxyphene n-acetaminophen   History: Past Medical History:  Diagnosis Date   Allergic rhinitis    Arthritis    Coronary artery disease    cath 01/12/2015 single vessel CAD, DES to mid LAD   GERD (gastroesophageal reflux disease)    Headache    History of chicken pox    Hyperlipidemia    Hypertension    Hyperthyroidism    Syncope    Past Surgical History:  Procedure Laterality Date   CARDIAC CATHETERIZATION N/A 01/12/2015   Procedure: Left Heart Cath and Coronary Angiography;  Surgeon: Peter M Martinique, MD;  Location: [facility];  Service: Cardiovascular;  Laterality: N/A;   CARDIAC CATHETERIZATION Right 01/12/2015   Procedure: Coronary Stent Intervention;  Surgeon: Peter M Martinique, MD;  Location: [facility];  Service: Cardiovascular;  Laterality: Right;   CORONARY STENT PLACEMENT  01/12/2015   des to [place]     Family History  Problem Relation Age of Onset   Coronary artery disease Mother    Hypertension Mother    Heart attack Mother    Diabetes Mother    Coronary artery disease Father    Hypertension Father    Coronary artery disease Paternal Grandmother    Hypertension Paternal Grandmother    Heart disease Son    Hypertension Son    Stroke Neg Hx    Cancer Neg Hx    Social History   Socioeconomic History   Marital status: Divorced    Spouse name: Not on file   Number of children: Not on file   Years of education: Not on file   Highest education level: Not on file  Occupational History   Not on file  Tobacco Use   Smoking status: Former   Smokeless tobacco: Never  Vaping Use   Vaping Use: Never used  Substance and Sexual Activity   Alcohol use: Yes     Comment: socially   Drug use: No   Sexual activity: Not on file  Other Topics Concern   Not on file  Social History Narrative   Lives alone.   2 sons - 1 locally with 5 kids.   Retired Nurse, learning disability, enjoys antique shows.   Social Determinants of Health   Financial Resource Strain: Low Risk  (09/22/2021)   Overall Financial Resource Strain (CARDIA)    Difficulty of Paying Living Expenses: Not hard at all  Food Insecurity: No Food Insecurity (09/22/2021)   Hunger Vital Sign    Worried About Running Out of Food in the Last Year: Never true  Ran Out of Food in the Last Year: Never true  Transportation Needs: No Transportation Needs (09/22/2021)   PRAPARE - Hydrologist (Medical): No    Lack of Transportation (Non-Medical): No  Physical Activity: Insufficiently Active (09/22/2021)   Exercise Vital Sign    Days of Exercise per Week: 3 days    Minutes of Exercise per Session: 30 min  Stress: No Stress Concern Present (09/22/2021)   [place]    Feeling of Stress : Not at all  Social Connections: Moderately Integrated (09/22/2021)   Social Connection and Isolation Panel [NHANES]    Frequency of Communication with Friends and Family: Three times a week    Frequency of Social Gatherings with Friends and Family: Three times a week    Attends Religious Services: 1 to 4 times per year    Active Member of Clubs or Organizations: Yes    Attends Archivist Meetings: 1 to 4 times per year    Marital Status: Divorced    Tobacco Counseling Counseling given: Not Answered   Clinical Intake:  Pre-visit preparation completed: Yes  Pain : No/denies pain     Nutritional Risks: None Diabetes: No  How often do you need to have someone help you when you read instructions, pamphlets, or other written materials from your doctor or pharmacy?: 1 - Never What is the last grade level  you completed in school?: collegeno  Diabetic?no   Interpreter Needed?: No  Information entered by :: L.Wilson,LPN   Activities of Daily Living    09/22/2021    2:48 PM 09/22/2021    2:34 PM  In your present state of health, do you have any difficulty performing the following activities:  Hearing? 0 0  Vision? 0 0  Difficulty concentrating or making decisions? 0 0  Walking or climbing stairs? 0 0  Dressing or bathing? 0 0  Doing errands, shopping? 0 0  Preparing Food and eating ? N N  Using the Toilet? N N  In the past six months, have you accidently leaked urine? N N  Do you have problems with loss of bowel control? N N  Managing your Medications? N N  Managing your Finances? N N  Housekeeping or managing your Housekeeping? N N    Patient Care Team: Midge Minium, MD as PCP - General Dorothy Spark, MD as PCP - Cardiology (Cardiology) Dorothy Spark, MD as Consulting Physician (Cardiology) Madelin Rear, [facility] as Pharmacist (Pharmacist) Marica Otter, OD as Consulting Physician (Optometry)  Indicate any recent Medical Services you may have received from other than [facility] providers in the past year (date may be approximate).     Assessment:   This is a routine wellness examination for Shakhia.  Hearing/Vision screen Vision Screening - Comments:: Annual eye exams wear glasses   Dietary issues and exercise activities discussed: Current Exercise Habits: Home exercise routine, Type of exercise: walking, Time (Minutes): 30, Frequency (Times/Week): 3, Weekly Exercise (Minutes/Week): 90, Intensity: Mild   Goals Addressed   None    Depression Screen    09/22/2021    2:47 PM 09/22/2021    2:41 PM 07/07/2021   11:04 AM 08/04/2020    7:35 AM 06/26/2019   11:02 AM 03/26/2019    8:32 AM 09/20/2018   11:20 AM  PHQ 2/9 Scores  PHQ - 2 Score 0 0 0 0 0 0 0  PHQ- 9 Score   0 0  0 0    Fall Risk    09/22/2021    2:47 PM 09/22/2021    2:34 PM 09/20/2021   10:38 AM  07/07/2021   11:04 AM 08/04/2020    7:35 AM  Fall Risk   Falls in the past year? 0 1 1  0  Number falls in past yr: 0 0 0 0 0  Injury with Fall? 0 0 0 0 0  Risk for fall due to :    No Fall Risks No Fall Risks  Follow up Falls evaluation completed;Education provided        FALL RISK PREVENTION PERTAINING TO THE HOME:  Any stairs in or around the home? Yes  If so, are there any without handrails? No  Home free of loose throw rugs in walkways, pet beds, electrical cords, etc? Yes  Adequate lighting in your home to reduce risk of falls? Yes   ASSISTIVE DEVICES UTILIZED TO PREVENT FALLS:  Life alert? No  Use of a cane, walker or w/c? No  Grab bars in the bathroom? No  Shower chair or bench in shower? No  Elevated toilet seat or a handicapped toilet? No    Cognitive Function:  Normal cognitive status assessed by telephone conversation  by this Nurse Health Advisor. No abnormalities found.      04/18/2018   10:30 AM  MMSE - Mini Mental State Exam  Orientation to time 5  Orientation to Place 5  Registration 3  Attention/ Calculation 5  Recall 2  Language- name 2 objects 2  Language- repeat 1  Language- follow 3 step command 3  Language- read & follow direction 1  Write a sentence 1  Copy design 1  Total score 29        06/26/2019   11:02 AM  6CIT Screen  What Year? 0 points  What month? 0 points  What time? 0 points  Count back from 20 0 points  Months in reverse 0 points  Repeat phrase 0 points  Total Score 0 points    Immunizations Immunization History  Administered Date(s) Administered   Influenza, High Dose Seasonal PF 11/20/2017, 11/09/2018   Influenza,inj,Quad PF,6+ Mos 11/29/2014   Influenza-Unspecified 12/29/2012, 10/29/2013, 12/17/2020   PFIZER(Purple Top)SARS-COV-2 Vaccination 04/10/2019, 05/01/2019, 12/17/2019   Pneumococcal Conjugate-13 04/30/2014   Pneumococcal Polysaccharide-23 04/05/2017   Td 08/23/2016   Zoster, Live 02/28/2010    TDAP  status: Up to date  Flu Vaccine status: Up to date  Pneumococcal vaccine status: Up to date  Covid-19 vaccine status: Completed vaccines  Qualifies for Shingles Vaccine? Yes   Zostavax completed No   Shingrix Completed?: No.    Education has been provided regarding the importance of this vaccine. Patient has been advised to call insurance company to determine out of pocket expense if they have not yet received this vaccine. Advised may also receive vaccine at local pharmacy or Health Dept. Verbalized acceptance and understanding.  Screening Tests Health Maintenance  Topic Date Due   INFLUENZA VACCINE  09/28/2021   TETANUS/TDAP  08/24/2026   Pneumonia Vaccine 39+ Years old  Completed   DEXA SCAN  Completed   Hepatitis C Screening  Completed   HPV VACCINES  Aged Out   COLONOSCOPY (Pts 45-49yr Insurance coverage will need to be confirmed)  Discontinued   COVID-19 Vaccine  Discontinued   Zoster Vaccines- Shingrix  Discontinued    Health Maintenance  There are no preventive care reminders to display for this patient.  Colorectal cancer screening:  No longer required.   Mammogram status: No longer required due to age.  Bone Density status: Completed 04/27/2017. Results reflect: Bone density results: OSTEOPENIA. Repeat every 5 years.  Lung Cancer Screening: (Low Dose CT Chest recommended if Age 40-80 years, 30 pack-year currently smoking OR have quit w/in 15years.) does not qualify.   Lung Cancer Screening Referral: n/a  Additional Screening:  Hepatitis C Screening: does not qualify;   Vision Screening: Recommended annual ophthalmology exams for early detection of glaucoma and other disorders of the eye. Is the patient up to date with their annual eye exam?  Yes  Who is the provider or what is the name of the office in which the patient attends annual eye exams? Dr.Miller  If pt is not established with a provider, would they like to be referred to a provider to establish care?  No .   Dental Screening: Recommended annual dental exams for proper oral hygiene  Community Resource Referral / Chronic Care Management: CRR required this visit?  No   CCM required this visit?  No      Plan:     I have personally reviewed and noted the following in the patient's chart:   Medical and social history Use of alcohol, tobacco or illicit drugs  Current medications and supplements including opioid prescriptions.  Functional ability and status Nutritional status Physical activity Advanced directives List of other physicians Hospitalizations, surgeries, and ER visits in previous 12 months Vitals Screenings to include cognitive, depression, and falls Referrals and appointments  In addition, I have reviewed and discussed with patient certain preventive protocols, quality metrics, and best practice recommendations. A written personalized care plan for preventive services as well as general preventive health recommendations were provided to patient.     Daphane Shepherd, LPN   05/19/252   Nurse Notes: none

## 2021-11-12 ENCOUNTER — Ambulatory Visit (INDEPENDENT_AMBULATORY_CARE_PROVIDER_SITE_OTHER): Payer: Medicare Other | Admitting: Family Medicine

## 2021-11-12 ENCOUNTER — Encounter: Payer: Self-pay | Admitting: Family Medicine

## 2021-11-12 VITALS — BP 138/66 | HR 66 | Temp 97.8°F | Resp 18 | Ht 65.0 in | Wt 171.0 lb

## 2021-11-12 DIAGNOSIS — I10 Essential (primary) hypertension: Secondary | ICD-10-CM

## 2021-11-12 DIAGNOSIS — E559 Vitamin D deficiency, unspecified: Secondary | ICD-10-CM

## 2021-11-12 DIAGNOSIS — H9191 Unspecified hearing loss, right ear: Secondary | ICD-10-CM

## 2021-11-12 DIAGNOSIS — Z0000 Encounter for general adult medical examination without abnormal findings: Secondary | ICD-10-CM

## 2021-11-12 LAB — CBC WITH DIFFERENTIAL/PLATELET
Basophils Absolute: 0.1 10*3/uL (ref 0.0–0.1)
Basophils Relative: 1 % (ref 0.0–3.0)
Eosinophils Absolute: 0.2 10*3/uL (ref 0.0–0.7)
Eosinophils Relative: 1.9 % (ref 0.0–5.0)
HCT: 40.8 % (ref 36.0–46.0)
Hemoglobin: 13.6 g/dL (ref 12.0–15.0)
Lymphocytes Relative: 28.7 % (ref 12.0–46.0)
Lymphs Abs: 2.4 10*3/uL (ref 0.7–4.0)
MCHC: 33.3 g/dL (ref 30.0–36.0)
MCV: 90.3 fl (ref 78.0–100.0)
Monocytes Absolute: 0.9 10*3/uL (ref 0.1–1.0)
Monocytes Relative: 11 % (ref 3.0–12.0)
Neutro Abs: 4.8 10*3/uL (ref 1.4–7.7)
Neutrophils Relative %: 57.4 % (ref 43.0–77.0)
Platelets: 283 10*3/uL (ref 150.0–400.0)
RBC: 4.52 Mil/uL (ref 3.87–5.11)
RDW: 13.3 % (ref 11.5–15.5)
WBC: 8.4 10*3/uL (ref 4.0–10.5)

## 2021-11-12 LAB — BASIC METABOLIC PANEL
BUN: 30 mg/dL — ABNORMAL HIGH (ref 6–23)
CO2: 28 mEq/L (ref 19–32)
Calcium: 10.2 mg/dL (ref 8.4–10.5)
Chloride: 104 mEq/L (ref 96–112)
Creatinine, Ser: 1.25 mg/dL — ABNORMAL HIGH (ref 0.40–1.20)
GFR: 41.74 mL/min — ABNORMAL LOW (ref >60.00)
Glucose, Bld: 97 mg/dL (ref 70–99)
Potassium: 4.7 mEq/L (ref 3.5–5.1)
Sodium: 139 mEq/L (ref 135–145)

## 2021-11-12 LAB — HEPATIC FUNCTION PANEL
ALT: 16 U/L (ref 0–35)
AST: 21 U/L (ref 0–37)
Albumin: 4.5 g/dL (ref 3.5–5.2)
Alkaline Phosphatase: 49 U/L (ref 39–117)
Bilirubin, Direct: 0.1 mg/dL (ref 0.0–0.3)
Total Bilirubin: 0.4 mg/dL (ref 0.2–1.2)
Total Protein: 7.2 g/dL (ref 6.0–8.3)

## 2021-11-12 LAB — LIPID PANEL
Cholesterol: 123 mg/dL (ref 0–200)
HDL: 51.2 mg/dL (ref >39.00)
LDL Cholesterol: 51 mg/dL (ref 0–99)
NonHDL: 71.6
Total CHOL/HDL Ratio: 2
Triglycerides: 102 mg/dL (ref 0.0–149.0)
VLDL: 20.4 mg/dL (ref 0.0–40.0)

## 2021-11-12 LAB — VITAMIN D 25 HYDROXY (VIT D DEFICIENCY, FRACTURES): VITD: 31.49 ng/mL (ref 30.00–100.00)

## 2021-11-12 LAB — TSH: TSH: 0.97 u[IU]/mL (ref 0.35–5.50)

## 2021-11-12 NOTE — Patient Instructions (Signed)
Follow up in 6 months to recheck BP and cholesterol We'll notify you of your lab results and make any changes if needed Call and schedule your mammogram at your convenience Continue to work on healthy diet and regular exercise We'll call you with your audiology appt to assess hearing Call with any questions or concerns Stay Safe!  Stay Healthy! Happy Fall!!!

## 2021-11-12 NOTE — Assessment & Plan Note (Signed)
Chronic problem.  Adequate control today.  Check labs due to diuretic use.  No anticipated med changes.

## 2021-11-12 NOTE — Progress Notes (Signed)
   Subjective:    Patient ID: Ann Wagner, female    DOB: 1945-09-24, 76 y.o.   MRN: [id number]  HPI CPE- UTD on Tdap, PNA.  No longer doing colonoscopy.  Pt is due for mammo- already scheduled.  UTD on flu shot.  Patient Care Team    Relationship Specialty Notifications Start End  Midge Minium, MD PCP - General   02/10/10    Comment: Melina Copa, MD PCP - Cardiology Cardiology Admissions 05/24/17   Dorothy Spark, MD Consulting Physician Cardiology  12/16/15   Madelin Rear, [facility] (Inactive) Pharmacist Pharmacist Admissions 06/07/19    Comment: phone number [phone]  Marica Otter, [place] [facility] Optometry  06/26/19     Health Maintenance  Topic Date Due   INFLUENZA VACCINE  05/29/2022 (Originally 09/28/2021)   TETANUS/TDAP  08/24/2026   Pneumonia Vaccine 68+ Years old  Completed   DEXA SCAN  Completed   Hepatitis C Screening  Completed   HPV VACCINES  Aged Out   COLONOSCOPY (Pts 45-8yr Insurance coverage will need to be confirmed)  Discontinued   COVID-19 Vaccine  Discontinued   Zoster Vaccines- Shingrix  Discontinued      Review of Systems Patient reports no vision changes, adenopathy,fever, weight change,  persistant/recurrent hoarseness , swallowing issues, chest pain, palpitations, edema, persistant/recurrent cough, hemoptysis, dyspnea (rest/exertional/paroxysmal nocturnal), gastrointestinal bleeding (melena, rectal bleeding), abdominal pain, significant heartburn, bowel changes, GU symptoms (dysuria, hematuria, incontinence), Gyn symptoms (abnormal  bleeding, pain),  syncope, focal weakness, memory loss, numbness & tingling, skin/hair/nail changes, abnormal bruising or bleeding, anxiety, or depression.   + decreased hearing R side    Objective:   Physical Exam General Appearance:    Alert, cooperative, no distress, appears stated age  Head:    Normocephalic, without obvious abnormality, atraumatic  Eyes:    PERRL, conjunctiva/corneas  clear, EOM's intact both eyes  Ears:    Normal TM's and external ear canals, both ears  Nose:   Nares normal, septum midline, mucosa normal, no drainage    or sinus tenderness  Throat:   Lips, mucosa, and tongue normal; teeth and gums normal  Neck:   Supple, symmetrical, trachea midline, no adenopathy;    Thyroid: no enlargement/tenderness/nodules  Back:     Symmetric, no curvature, ROM normal, no CVA tenderness  Lungs:     Clear to auscultation bilaterally, respirations unlabored  Chest Wall:    No tenderness or deformity   Heart:    Regular rate and rhythm, S1 and S2 normal, no murmur, rub   or gallop  Breast Exam:    Deferred to mammo  Abdomen:     Soft, non-tender, bowel sounds active all four quadrants,    no masses, no organomegaly  Genitalia:    Deferred  Rectal:    Extremities:   Extremities normal, atraumatic, no cyanosis or edema  Pulses:   2+ and symmetric all extremities  Skin:   Skin color, texture, turgor normal, no rashes or lesions  Lymph nodes:   Cervical, supraclavicular, and axillary nodes normal  Neurologic:   CNII-XII intact, normal strength, sensation and reflexes    throughout          Assessment & Plan:

## 2021-11-12 NOTE — Assessment & Plan Note (Signed)
Pt's PE WNL w/ exception of BMI.  UTD on Tdap, PNA.  No longer doing colonoscopy.  Due for mammogram- pt to schedule at her convenience.  Check labs.  Anticipatory guidance provided.

## 2021-11-15 ENCOUNTER — Other Ambulatory Visit: Payer: Self-pay

## 2021-11-15 DIAGNOSIS — R748 Abnormal levels of other serum enzymes: Secondary | ICD-10-CM

## 2021-11-22 ENCOUNTER — Other Ambulatory Visit (INDEPENDENT_AMBULATORY_CARE_PROVIDER_SITE_OTHER): Payer: Medicare Other

## 2021-11-22 DIAGNOSIS — N289 Disorder of kidney and ureter, unspecified: Secondary | ICD-10-CM | POA: Diagnosis not present

## 2021-11-22 DIAGNOSIS — R748 Abnormal levels of other serum enzymes: Secondary | ICD-10-CM

## 2021-11-22 LAB — BASIC METABOLIC PANEL WITH GFR
BUN: 23 mg/dL (ref 6–23)
CO2: 26 meq/L (ref 19–32)
Calcium: 9.9 mg/dL (ref 8.4–10.5)
Chloride: 104 meq/L (ref 96–112)
Creatinine, Ser: 1.06 mg/dL (ref 0.40–1.20)
GFR: 50.86 mL/min — ABNORMAL LOW
Glucose, Bld: 101 mg/dL — ABNORMAL HIGH (ref 70–99)
Potassium: 4.1 meq/L (ref 3.5–5.1)
Sodium: 139 meq/L (ref 135–145)

## 2021-11-23 ENCOUNTER — Telehealth: Payer: Self-pay

## 2021-11-23 NOTE — Telephone Encounter (Signed)
-----   Message from Midge Minium, MD sent at 11/23/2021  7:38 AM EDT ----- Kidney function is back to baseline.  This is great news!

## 2021-11-23 NOTE — Telephone Encounter (Signed)
Informed pt of lab results  

## 2021-11-30 ENCOUNTER — Other Ambulatory Visit: Payer: Self-pay

## 2021-11-30 DIAGNOSIS — H903 Sensorineural hearing loss, bilateral: Secondary | ICD-10-CM | POA: Diagnosis not present

## 2021-11-30 DIAGNOSIS — I10 Essential (primary) hypertension: Secondary | ICD-10-CM

## 2021-11-30 MED ORDER — LISINOPRIL 40 MG PO TABS: 40.0000 mg | ORAL_TABLET | Freq: Every day | ORAL | 1 refills | Status: DC

## 2021-12-01 ENCOUNTER — Other Ambulatory Visit: Payer: Self-pay

## 2021-12-01 DIAGNOSIS — E038 Other specified hypothyroidism: Secondary | ICD-10-CM

## 2021-12-01 MED ORDER — LEVOTHYROXINE SODIUM 88 MCG PO TABS: ORAL_TABLET | ORAL | 1 refills | Status: DC
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
## 2021-12-15 ENCOUNTER — Ambulatory Visit: Payer: Medicare Other | Attending: Cardiology | Admitting: Cardiology

## 2021-12-15 ENCOUNTER — Encounter: Payer: Self-pay | Admitting: Cardiology

## 2021-12-15 VITALS — BP 139/70 | HR 49 | Ht 65.0 in | Wt 172.6 lb

## 2021-12-15 DIAGNOSIS — I10 Essential (primary) hypertension: Secondary | ICD-10-CM

## 2021-12-15 DIAGNOSIS — I6523 Occlusion and stenosis of bilateral carotid arteries: Secondary | ICD-10-CM | POA: Diagnosis not present

## 2021-12-15 DIAGNOSIS — R001 Bradycardia, unspecified: Secondary | ICD-10-CM | POA: Diagnosis not present

## 2021-12-15 DIAGNOSIS — I2510 Atherosclerotic heart disease of native coronary artery without angina pectoris: Secondary | ICD-10-CM

## 2021-12-15 DIAGNOSIS — E782 Mixed hyperlipidemia: Secondary | ICD-10-CM | POA: Diagnosis not present

## 2021-12-15 MED ORDER — ISOSORBIDE MONONITRATE ER 30 MG PO TB24: 30.0000 mg | ORAL_TABLET | Freq: Every day | ORAL | 3 refills | Status: DC

## 2021-12-15 NOTE — Patient Instructions (Signed)
Medication Instructions:   STOP TAKING HYDRALAZINE NOW  START TAKING ISOSORBIDE MONONITRATE (IMDUR)  30 MG BY MOUTH DAILY  *If you need a refill on your cardiac medications before your next appointment, please call your pharmacy*   Follow-Up: At [facility], you and your health needs are our priority.  As part of our continuing mission to provide you with exceptional heart care, we have created designated Provider Care Teams.  These Care Teams include your primary Cardiologist (physician) and Advanced Practice Providers (APPs -  Physician Assistants and Nurse Practitioners) who all work together to provide you with the care you need, when you need it.  We recommend signing up for the patient portal called "MyChart".  Sign up information is provided on this After Visit Summary.  MyChart is used to connect with patients for Virtual Visits (Telemedicine).  Patients are able to view lab/test results, encounter notes, upcoming appointments, etc.  Non-urgent messages can be sent to your provider as well.   To learn more about what you can do with MyChart, go to [email or web address].    Your next appointment:   6 month(s)  The format for your next appointment:   In Person  Provider:   DR. Johney Frame  Important Information About Sugar
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
## 2022-03-01 DIAGNOSIS — H53143 Visual discomfort, bilateral: Secondary | ICD-10-CM | POA: Diagnosis not present

## 2022-03-10 ENCOUNTER — Other Ambulatory Visit: Payer: Self-pay | Admitting: *Deleted

## 2022-03-10 DIAGNOSIS — R079 Chest pain, unspecified: Secondary | ICD-10-CM

## 2022-03-10 DIAGNOSIS — I10 Essential (primary) hypertension: Secondary | ICD-10-CM

## 2022-03-10 DIAGNOSIS — Z79899 Other long term (current) drug therapy: Secondary | ICD-10-CM

## 2022-03-10 MED ORDER — SPIRONOLACTONE 25 MG PO TABS: 25.0000 mg | ORAL_TABLET | Freq: Every day | ORAL | 1 refills | Status: DC

## 2022-03-11 ENCOUNTER — Other Ambulatory Visit: Payer: Self-pay

## 2022-03-11 DIAGNOSIS — Z79899 Other long term (current) drug therapy: Secondary | ICD-10-CM

## 2022-03-11 DIAGNOSIS — I10 Essential (primary) hypertension: Secondary | ICD-10-CM

## 2022-03-11 DIAGNOSIS — R079 Chest pain, unspecified: Secondary | ICD-10-CM

## 2022-03-11 MED ORDER — SPIRONOLACTONE 25 MG PO TABS: 25.0000 mg | ORAL_TABLET | Freq: Every day | ORAL | 2 refills | Status: DC

## 2022-03-22 DIAGNOSIS — H25812 Combined forms of age-related cataract, left eye: Secondary | ICD-10-CM | POA: Diagnosis not present

## 2022-03-22 DIAGNOSIS — H25811 Combined forms of age-related cataract, right eye: Secondary | ICD-10-CM | POA: Diagnosis not present

## 2022-04-05 ENCOUNTER — Telehealth: Payer: Self-pay | Admitting: Family Medicine

## 2022-04-05 ENCOUNTER — Other Ambulatory Visit: Payer: Self-pay

## 2022-04-05 DIAGNOSIS — K219 Gastro-esophageal reflux disease without esophagitis: Secondary | ICD-10-CM

## 2022-04-05 MED ORDER — PANTOPRAZOLE SODIUM 40 MG PO TBEC: DELAYED_RELEASE_TABLET | ORAL | 1 refills | Status: DC

## 2022-04-05 MED ORDER — PAROXETINE HCL 10 MG PO TABS: ORAL_TABLET | ORAL | 1 refills | Status: DC

## 2022-04-05 NOTE — Telephone Encounter (Signed)
Refills sent in

## 2022-04-05 NOTE — Telephone Encounter (Signed)
Prescription Request  04/05/2022  Is this a "Controlled Substance" medicine? No  LOV: 11/12/2021  What is the name of the medication or equipment?  PARoxetine (PAXIL) 10 MG tablet  pantoprazole (PROTONIX) 40 MG tablet  Have you contacted your pharmacy to request a refill? No   Which pharmacy would you like this sent to?  [facility] [address] [place] [address] Phone: [phone] Fax: [phone]    Patient notified that their request is being sent to the clinical staff for review and that they should receive a response within 2 business days.   Please advise at Mobile [phone] (mobile)

## 2022-04-18 DIAGNOSIS — H269 Unspecified cataract: Secondary | ICD-10-CM | POA: Diagnosis not present

## 2022-04-18 DIAGNOSIS — H25812 Combined forms of age-related cataract, left eye: Secondary | ICD-10-CM | POA: Diagnosis not present

## 2022-04-18 DIAGNOSIS — H2512 Age-related nuclear cataract, left eye: Secondary | ICD-10-CM | POA: Diagnosis not present

## 2022-05-02 ENCOUNTER — Other Ambulatory Visit: Payer: Self-pay | Admitting: Cardiology

## 2022-05-02 DIAGNOSIS — I6523 Occlusion and stenosis of bilateral carotid arteries: Secondary | ICD-10-CM

## 2022-05-03 DIAGNOSIS — H52223 Regular astigmatism, bilateral: Secondary | ICD-10-CM | POA: Diagnosis not present

## 2022-05-03 DIAGNOSIS — H524 Presbyopia: Secondary | ICD-10-CM | POA: Diagnosis not present

## 2022-05-03 DIAGNOSIS — H40021 Open angle with borderline findings, high risk, right eye: Secondary | ICD-10-CM | POA: Diagnosis not present

## 2022-05-03 DIAGNOSIS — H5203 Hypermetropia, bilateral: Secondary | ICD-10-CM | POA: Diagnosis not present

## 2022-05-06 ENCOUNTER — Ambulatory Visit (HOSPITAL_COMMUNITY)
Admission: RE | Admit: 2022-05-06 | Discharge: 2022-05-06 | Disposition: A | Discharge status: Home / Self Care | Payer: Medicare Other | Source: Ambulatory Visit | Attending: Cardiology | Admitting: Cardiology

## 2022-05-06 DIAGNOSIS — I6523 Occlusion and stenosis of bilateral carotid arteries: Secondary | ICD-10-CM | POA: Diagnosis not present

## 2022-05-09 ENCOUNTER — Telehealth: Payer: Self-pay | Admitting: *Deleted

## 2022-05-09 DIAGNOSIS — I6523 Occlusion and stenosis of bilateral carotid arteries: Secondary | ICD-10-CM

## 2022-05-09 NOTE — Telephone Encounter (Signed)
-----   Message from Freada Bergeron, MD sent at 05/08/2022  8:43 PM EDT ----- Her right carotid shows moderate disease on the right and mild disease on the left. Will continue with medical therapy and annual monitoring.

## 2022-05-09 NOTE — Telephone Encounter (Signed)
The patient has been notified of the result and verbalized understanding.  All questions (if any) were answered.  Pt aware to continue her current regimen and we will repeat carotids again in one year for surveillance.   Pt aware that I will go ahead and place the order for repeat carotids in one year in the system and send a message to our PV Scheduler to call her back to arrange this appt for that time.  Pt verbalized understanding and agrees with this plan.

## 2022-05-23 DIAGNOSIS — H269 Unspecified cataract: Secondary | ICD-10-CM | POA: Diagnosis not present

## 2022-05-23 DIAGNOSIS — H25811 Combined forms of age-related cataract, right eye: Secondary | ICD-10-CM | POA: Diagnosis not present

## 2022-05-30 DIAGNOSIS — H2511 Age-related nuclear cataract, right eye: Secondary | ICD-10-CM | POA: Diagnosis not present

## 2022-05-30 DIAGNOSIS — H52223 Regular astigmatism, bilateral: Secondary | ICD-10-CM | POA: Diagnosis not present

## 2022-05-30 DIAGNOSIS — H524 Presbyopia: Secondary | ICD-10-CM | POA: Diagnosis not present

## 2022-05-30 DIAGNOSIS — H5203 Hypermetropia, bilateral: Secondary | ICD-10-CM | POA: Diagnosis not present

## 2022-05-30 DIAGNOSIS — Z961 Presence of intraocular lens: Secondary | ICD-10-CM | POA: Diagnosis not present

## 2022-05-31 ENCOUNTER — Other Ambulatory Visit: Payer: Self-pay | Admitting: Family Medicine

## 2022-05-31 ENCOUNTER — Other Ambulatory Visit: Payer: Self-pay

## 2022-05-31 DIAGNOSIS — I10 Essential (primary) hypertension: Secondary | ICD-10-CM

## 2022-05-31 MED ORDER — LISINOPRIL 40 MG PO TABS: 40.0000 mg | ORAL_TABLET | Freq: Every day | ORAL | 1 refills | Status: DC

## 2022-06-13 ENCOUNTER — Telehealth: Payer: Self-pay | Admitting: Cardiology

## 2022-06-13 MED ORDER — CHLORTHALIDONE 25 MG PO TABS: 25.0000 mg | ORAL_TABLET | Freq: Every day | ORAL | 1 refills | Status: DC

## 2022-06-13 NOTE — Telephone Encounter (Signed)
Pt's medication was sent to pt's pharmacy as requested. Confirmation received.  °

## 2022-06-13 NOTE — Telephone Encounter (Signed)
*  STAT* If patient is at the pharmacy, call can be transferred to refill team.   1. Which medications need to be refilled? (please list name of each medication and dose if known) chlorthalidone (HYGROTON) 25 MG tablet   2. Which pharmacy/location (including street and city if local pharmacy) is medication to be sent to?  [id number] - [place], [place] - [address] AT [address]    3. Do they need a 30 day or 90 day supply? 90   Patient has appt on 4/23

## 2022-06-14 ENCOUNTER — Other Ambulatory Visit: Payer: Self-pay | Admitting: Family Medicine

## 2022-06-14 DIAGNOSIS — I10 Essential (primary) hypertension: Secondary | ICD-10-CM
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
## 2022-06-21 ENCOUNTER — Ambulatory Visit: Payer: Medicare Other | Attending: Physician Assistant | Admitting: Physician Assistant

## 2022-06-21 ENCOUNTER — Encounter: Payer: Self-pay | Admitting: Physician Assistant

## 2022-06-21 VITALS — BP 168/58 | HR 42 | Ht 65.0 in | Wt 171.8 lb

## 2022-06-21 DIAGNOSIS — I10 Essential (primary) hypertension: Secondary | ICD-10-CM

## 2022-06-21 DIAGNOSIS — I2510 Atherosclerotic heart disease of native coronary artery without angina pectoris: Secondary | ICD-10-CM

## 2022-06-21 DIAGNOSIS — M7989 Other specified soft tissue disorders: Secondary | ICD-10-CM

## 2022-06-21 DIAGNOSIS — E782 Mixed hyperlipidemia: Secondary | ICD-10-CM | POA: Diagnosis not present

## 2022-06-21 DIAGNOSIS — Z79899 Other long term (current) drug therapy: Secondary | ICD-10-CM | POA: Diagnosis not present

## 2022-06-21 DIAGNOSIS — R001 Bradycardia, unspecified: Secondary | ICD-10-CM | POA: Diagnosis not present

## 2022-06-21 DIAGNOSIS — I6523 Occlusion and stenosis of bilateral carotid arteries: Secondary | ICD-10-CM

## 2022-06-21 NOTE — Patient Instructions (Addendum)
Medication Instructions:  Your physician recommends that you continue on your current medications as directed. Please refer to the Current Medication list given to you today.  *If you need a refill on your cardiac medications before your next appointment, please call your pharmacy*  Lab Work: Lipids and lft's with primary care in September 2024 If you have labs (blood work) drawn today and your tests are completely normal, you will receive your results only by: MyChart Message (if you have MyChart) OR A paper copy in the mail If you have any lab test that is abnormal or we need to change your treatment, we will call you to review the results.  Follow-Up: At [facility], you and your health needs are our priority.  As part of our continuing mission to provide you with exceptional heart care, we have created designated Provider Care Teams.  These Care Teams include your primary Cardiologist (physician) and Advanced Practice Providers (APPs -  Physician Assistants and Nurse Practitioners) who all work together to provide you with the care you need, when you need it.  Your next appointment:   6 month(s)  Provider:   Meriam Sprague, MD  or Jari Favre, PA-C        Other Instructions 1.Check your blood pressure daily, 1-2 hours after taking your morning medications for 2 weeks, keep a log and send [place] the readings through mychart at the end of the 2 weeks  Low-Sodium Eating Plan Sodium, which is an element that makes up salt, helps you maintain a healthy balance of fluids in your body. Too much sodium can increase your blood pressure and cause fluid and waste to be held in your body. Your health care provider or dietitian may recommend following this plan if you have high blood pressure (hypertension), kidney disease, liver disease, or heart failure. Eating less sodium can help lower your blood pressure, reduce swelling, and protect your heart, liver, and kidneys. What are tips for  following this plan? Reading food labels The Nutrition Facts label lists the amount of sodium in one serving of the food. If you eat more than one serving, you must multiply the listed amount of sodium by the number of servings. Choose foods with less than 140 mg of sodium per serving. Avoid foods with 300 mg of sodium or more per serving. Shopping  Look for lower-sodium products, often labeled as "low-sodium" or "no salt added." Always check the sodium content, even if foods are labeled as "unsalted" or "no salt added." Buy fresh foods. Avoid canned foods and pre-made or frozen meals. Avoid canned, cured, or processed meats. Buy breads that have less than 80 mg of sodium per slice. Cooking  Eat more home-cooked food and less restaurant, buffet, and fast food. Avoid adding salt when cooking. Use salt-free seasonings or herbs instead of table salt or sea salt. Check with your health care provider or pharmacist before using salt substitutes. Cook with plant-based oils, such as canola, sunflower, or olive oil. Meal planning When eating at a restaurant, ask that your food be prepared with less salt or no salt, if possible. Avoid dishes labeled as brined, pickled, cured, smoked, or made with soy sauce, miso, or teriyaki sauce. Avoid foods that contain MSG (monosodium glutamate). MSG is sometimes added to [place] food, bouillon, and some canned foods. Make meals that can be grilled, baked, poached, roasted, or steamed. These are generally made with less sodium. General information Most people on this plan should limit their sodium  intake to 1,500-2,000 mg (milligrams) of sodium each day. What foods should I eat? Fruits Fresh, frozen, or canned fruit. Fruit juice. Vegetables Fresh or frozen vegetables. "No salt added" canned vegetables. "No salt added" tomato sauce and paste. Low-sodium or reduced-sodium tomato and vegetable juice. Grains Low-sodium cereals, including oats, puffed wheat and  rice, and shredded wheat. Low-sodium crackers. Unsalted rice. Unsalted pasta. Low-sodium bread. Whole-grain breads and whole-grain pasta. Meats and other proteins Fresh or frozen (no salt added) meat, poultry, seafood, and fish. Low-sodium canned tuna and salmon. Unsalted nuts. Dried peas, beans, and lentils without added salt. Unsalted canned beans. Eggs. Unsalted nut butters. Dairy Milk. Soy milk. Cheese that is naturally low in sodium, such as ricotta cheese, fresh mozzarella, or Swiss cheese. Low-sodium or reduced-sodium cheese. Cream cheese. Yogurt. Seasonings and condiments Fresh and dried herbs and spices. Salt-free seasonings. Low-sodium mustard and ketchup. Sodium-free salad dressing. Sodium-free light mayonnaise. Fresh or refrigerated horseradish. Lemon juice. Vinegar. Other foods Homemade, reduced-sodium, or low-sodium soups. Unsalted popcorn and pretzels. Low-salt or salt-free chips. The items listed above may not be a complete list of foods and beverages you can eat. Contact a dietitian for more information. What foods should I avoid? Vegetables Sauerkraut, pickled vegetables, and relishes. Olives. [place] fries. Onion rings. Regular canned vegetables (not low-sodium or reduced-sodium). Regular canned tomato sauce and paste (not low-sodium or reduced-sodium). Regular tomato and vegetable juice (not low-sodium or reduced-sodium). Frozen vegetables in sauces. Grains Instant hot cereals. Bread stuffing, pancake, and biscuit mixes. Croutons. Seasoned rice or pasta mixes. Noodle soup cups. Boxed or frozen macaroni and cheese. Regular salted crackers. Self-rising flour. Meats and other proteins Meat or fish that is salted, canned, smoked, spiced, or pickled. Precooked or cured meat, such as sausages or meat loaves. Tomasa Blase. Ham. Pepperoni. Hot dogs. Corned beef. Chipped beef. Salt pork. Jerky. Pickled herring. Anchovies and sardines. Regular canned tuna. Salted nuts. Dairy Processed cheese and  cheese spreads. Hard cheeses. Cheese curds. Blue cheese. Feta cheese. String cheese. Regular cottage cheese. Buttermilk. Canned milk. Fats and oils Salted butter. Regular margarine. Ghee. Bacon fat. Seasonings and condiments Onion salt, garlic salt, seasoned salt, table salt, and sea salt. Canned and packaged gravies. Worcestershire sauce. Tartar sauce. Barbecue sauce. Teriyaki sauce. Soy sauce, including reduced-sodium. Steak sauce. Fish sauce. Oyster sauce. Cocktail sauce. Horseradish that you find on the shelf. Regular ketchup and mustard. Meat flavorings and tenderizers. Bouillon cubes. Hot sauce. Pre-made or packaged marinades. Pre-made or packaged taco seasonings. Relishes. Regular salad dressings. Salsa. Other foods Salted popcorn and pretzels. Corn chips and puffs. Potato and tortilla chips. Canned or dried soups. Pizza. Frozen entrees and pot pies. The items listed above may not be a complete list of foods and beverages you should avoid. Contact a dietitian for more information. Summary Eating less sodium can help lower your blood pressure, reduce swelling, and protect your heart, liver, and kidneys. Most people on this plan should limit their sodium intake to 1,500-2,000 mg (milligrams) of sodium each day. Canned, boxed, and frozen foods are high in sodium. Restaurant foods, fast foods, and pizza are also very high in sodium. You also get sodium by adding salt to food. Try to cook at home, eat more fresh fruits and vegetables, and eat less fast food and canned, processed, or prepared foods. This information is not intended to replace advice given to you by your health care provider. Make sure you discuss any questions you have with your health care provider. Document Revised: 01/21/2019 Document Reviewed: 01/16/2019  Elsevier Patient Education  2023 Elsevier Inc[employer]  Heart-Healthy Eating Plan Many factors influence your heart health, including eating and exercise habits. Heart health is also  called coronary health. Coronary risk increases with abnormal blood fat (lipid) levels. A heart-healthy eating plan includes limiting unhealthy fats, increasing healthy fats, limiting salt (sodium) intake, and making other diet and lifestyle changes. What is my plan? Your health care provider may recommend that: You limit your fat intake to _________% or less of your total calories each day. You limit your saturated fat intake to _________% or less of your total calories each day. You limit the amount of cholesterol in your diet to less than _________ mg per day. You limit the amount of sodium in your diet to less than _________ mg per day. What are tips for following this plan? Cooking Cook foods using methods other than frying. Baking, boiling, grilling, and broiling are all good options. Other ways to reduce fat include: Removing the skin from poultry. Removing all visible fats from meats. Steaming vegetables in water or broth. Meal planning  At meals, imagine dividing your plate into fourths: Fill one-half of your plate with vegetables and green salads. Fill one-fourth of your plate with whole grains. Fill one-fourth of your plate with lean protein foods. Eat 2-4 cups of vegetables per day. One cup of vegetables equals 1 cup (91 g) broccoli or cauliflower florets, 2 medium carrots, 1 large bell pepper, 1 large sweet potato, 1 large tomato, 1 medium white potato, 2 cups (150 g) raw leafy greens. Eat 1-2 cups of fruit per day. One cup of fruit equals 1 small apple, 1 large banana, 1 cup (237 g) mixed fruit, 1 large orange,  cup (82 g) dried fruit, 1 cup (240 mL) 100% fruit juice. Eat more foods that contain soluble fiber. Examples include apples, broccoli, carrots, beans, peas, and barley. Aim to get 25-30 g of fiber per day. Increase your consumption of legumes, nuts, and seeds to 4-5 servings per week. One serving of dried beans or legumes equals  cup (90 g) cooked, 1 serving of nuts  is  oz (12 almonds, 24 pistachios, or 7 walnut halves), and 1 serving of seeds equals  oz (8 g). Fats Choose healthy fats more often. Choose monounsaturated and polyunsaturated fats, such as olive and canola oils, avocado oil, flaxseeds, walnuts, almonds, and seeds. Eat more omega-3 fats. Choose salmon, mackerel, sardines, tuna, flaxseed oil, and ground flaxseeds. Aim to eat fish at least 2 times each week. Check food labels carefully to identify foods with trans fats or high amounts of saturated fat. Limit saturated fats. These are found in animal products, such as meats, butter, and cream. Plant sources of saturated fats include palm oil, palm kernel oil, and coconut oil. Avoid foods with partially hydrogenated oils in them. These contain trans fats. Examples are stick margarine, some tub margarines, cookies, crackers, and other baked goods. Avoid fried foods. General information Eat more home-cooked food and less restaurant, buffet, and fast food. Limit or avoid alcohol. Limit foods that are high in added sugar and simple starches such as foods made using white refined flour (white breads, pastries, sweets). Lose weight if you are overweight. Losing just 5-10% of your body weight can help your overall health and prevent diseases such as diabetes and heart disease. Monitor your sodium intake, especially if you have high blood pressure. Talk with your health care provider about your sodium intake. Try to incorporate more vegetarian meals weekly. What foods  should I eat? Fruits All fresh, canned (in natural juice), or frozen fruits. Vegetables Fresh or frozen vegetables (raw, steamed, roasted, or grilled). Green salads. Grains Most grains. Choose whole wheat and whole grains most of the time. Rice and pasta, including brown rice and pastas made with whole wheat. Meats and other proteins Lean, well-trimmed beef, veal, pork, and lamb. Chicken and [place] without skin. All fish and shellfish.  Wild duck, rabbit, pheasant, and venison. Egg whites or low-cholesterol egg substitutes. Dried beans, peas, lentils, and tofu. Seeds and most nuts. Dairy Low-fat or nonfat cheeses, including ricotta and mozzarella. Skim or 1% milk (liquid, powdered, or evaporated). Buttermilk made with low-fat milk. Nonfat or low-fat yogurt. Fats and oils Non-hydrogenated (trans-free) margarines. Vegetable oils, including soybean, sesame, sunflower, olive, avocado, peanut, safflower, corn, canola, and cottonseed. Salad dressings or mayonnaise made with a vegetable oil. Beverages Water (mineral or sparkling). Coffee and tea. Unsweetened ice tea. Diet beverages. Sweets and desserts Sherbet, gelatin, and fruit ice. Small amounts of dark chocolate. Limit all sweets and desserts. Seasonings and condiments All seasonings and condiments. The items listed above may not be a complete list of foods and beverages you can eat. Contact a dietitian for more options. What foods should I avoid? Fruits Canned fruit in heavy syrup. Fruit in cream or butter sauce. Fried fruit. Limit coconut. Vegetables Vegetables cooked in cheese, cream, or butter sauce. Fried vegetables. Grains Breads made with saturated or trans fats, oils, or whole milk. Croissants. Sweet rolls. Donuts. High-fat crackers, such as cheese crackers and chips. Meats and other proteins Fatty meats, such as hot dogs, ribs, sausage, bacon, rib-eye roast or steak. High-fat deli meats, such as salami and bologna. Caviar. Domestic duck and goose. Organ meats, such as liver. Dairy Cream, sour cream, cream cheese, and creamed cottage cheese. Whole-milk cheeses. Whole or 2% milk (liquid, evaporated, or condensed). Whole buttermilk. Cream sauce or high-fat cheese sauce. Whole-milk yogurt. Fats and oils Meat fat, or shortening. Cocoa butter, hydrogenated oils, palm oil, coconut oil, palm kernel oil. Solid fats and shortenings, including bacon fat, salt pork, lard, and  butter. Nondairy cream substitutes. Salad dressings with cheese or sour cream. Beverages Regular sodas and any drinks with added sugar. Sweets and desserts Frosting. Pudding. Cookies. Cakes. Pies. Milk chocolate or white chocolate. Buttered syrups. Full-fat ice cream or ice cream drinks. The items listed above may not be a complete list of foods and beverages to avoid. Contact a dietitian for more information. Summary Heart-healthy meal planning includes limiting unhealthy fats, increasing healthy fats, limiting salt (sodium) intake and making other diet and lifestyle changes. Lose weight if you are overweight. Losing just 5-10% of your body weight can help your overall health and prevent diseases such as diabetes and heart disease. Focus on eating a balance of foods, including fruits and vegetables, low-fat or nonfat dairy, lean protein, nuts and legumes, whole grains, and heart-healthy oils and fats. This information is not intended to replace advice given to you by your health care provider. Make sure you discuss any questions you have with your health care provider. Document Revised: 03/22/2021 Document Reviewed: 03/22/2021 Elsevier Patient Education  2023 [employer].

## 2022-06-29 ENCOUNTER — Other Ambulatory Visit: Payer: Self-pay

## 2022-06-29 MED ORDER — FENOFIBRATE 160 MG PO TABS: 160.0000 mg | ORAL_TABLET | Freq: Every day | ORAL | 3 refills | Status: DC

## 2022-07-29 ENCOUNTER — Other Ambulatory Visit: Payer: Self-pay

## 2022-07-29 DIAGNOSIS — I10 Essential (primary) hypertension: Secondary | ICD-10-CM

## 2022-07-29 DIAGNOSIS — I2510 Atherosclerotic heart disease of native coronary artery without angina pectoris: Secondary | ICD-10-CM

## 2022-07-29 DIAGNOSIS — I6523 Occlusion and stenosis of bilateral carotid arteries: Secondary | ICD-10-CM

## 2022-07-29 DIAGNOSIS — E782 Mixed hyperlipidemia: Secondary | ICD-10-CM

## 2022-07-29 MED ORDER — ROSUVASTATIN CALCIUM 20 MG PO TABS: 20.0000 mg | ORAL_TABLET | Freq: Every day | ORAL | 3 refills | Status: DC

## 2022-08-17 ENCOUNTER — Ambulatory Visit (INDEPENDENT_AMBULATORY_CARE_PROVIDER_SITE_OTHER): Payer: Medicare Other | Admitting: *Deleted

## 2022-08-17 DIAGNOSIS — Z0000 Encounter for general adult medical examination without abnormal findings: Secondary | ICD-10-CM

## 2022-08-17 NOTE — Patient Instructions (Signed)
Ms. Ann Wagner , Thank you for taking time to come for your Medicare Wellness Visit. I appreciate your ongoing commitment to your health goals. Please review the following plan we discussed and let me know if I can assist you in the future.   Screening recommendations/referrals: Colonoscopy: no longer required Mammogram: Education provided Bone Density: up to date Recommended yearly ophthalmology/optometry visit for glaucoma screening and checkup Recommended yearly dental visit for hygiene and checkup  Vaccinations: Influenza vaccine: up to date Pneumococcal vaccine: up to date Tdap vaccine: up to date Shingles vaccine: Education provided    Advanced directives: yes     Preventive Care 65 Years and Older, Female Preventive care refers to lifestyle choices and visits with your health care provider that can promote health and wellness. What does preventive care include? A yearly physical exam. This is also called an annual well check. Dental exams once or twice a year. Routine eye exams. Ask your health care provider how often you should have your eyes checked. Personal lifestyle choices, including: Daily care of your teeth and gums. Regular physical activity. Eating a healthy diet. Avoiding tobacco and drug use. Limiting alcohol use. Practicing safe sex. Taking low-dose aspirin every day. Taking vitamin and mineral supplements as recommended by your health care provider. What happens during an annual well check? The services and screenings done by your health care provider during your annual well check will depend on your age, overall health, lifestyle risk factors, and family history of disease. Counseling  Your health care provider may ask you questions about your: Alcohol use. Tobacco use. Drug use. Emotional well-being. Home and relationship well-being. Sexual activity. Eating habits. History of falls. Memory and ability to understand (cognition). Work and work  Astronomer. Reproductive health. Screening  You may have the following tests or measurements: Height, weight, and BMI. Blood pressure. Lipid and cholesterol levels. These may be checked every 5 years, or more frequently if you are over 29 years old. Skin check. Lung cancer screening. You may have this screening every year starting at age 58 if you have a 30-pack-year history of smoking and currently smoke or have quit within the past 15 years. Fecal occult blood test (FOBT) of the stool. You may have this test every year starting at age 53. Flexible sigmoidoscopy or colonoscopy. You may have a sigmoidoscopy every 5 years or a colonoscopy every 10 years starting at age 49. Hepatitis C blood test. Hepatitis B blood test. Sexually transmitted disease (STD) testing. Diabetes screening. This is done by checking your blood sugar (glucose) after you have not eaten for a while (fasting). You may have this done every 1-3 years. Bone density scan. This is done to screen for osteoporosis. You may have this done starting at age 75. Mammogram. This may be done every 1-2 years. Talk to your health care provider about how often you should have regular mammograms. Talk with your health care provider about your test results, treatment options, and if necessary, the need for more tests. Vaccines  Your health care provider may recommend certain vaccines, such as: Influenza vaccine. This is recommended every year. Tetanus, diphtheria, and acellular pertussis (Tdap, Td) vaccine. You may need a Td booster every 10 years. Zoster vaccine. You may need this after age 23. Pneumococcal 13-valent conjugate (PCV13) vaccine. One dose is recommended after age 5. Pneumococcal polysaccharide (PPSV23) vaccine. One dose is recommended after age 74. Talk to your health care provider about which screenings and vaccines you need and how often you  need them. This information is not intended to replace advice given to you by  your health care provider. Make sure you discuss any questions you have with your health care provider. Document Released: 03/13/2015 Document Revised: 11/04/2015 Document Reviewed: 12/16/2014 Elsevier Interactive Patient Education  2017 [place] Prevention in the Home Falls can cause injuries. They can happen to people of all ages. There are many things you can do to make your home safe and to help prevent falls. What can I do on the outside of my home? Regularly fix the edges of walkways and driveways and fix any cracks. Remove anything that might make you trip as you walk through a door, such as a raised step or threshold. Trim any bushes or trees on the path to your home. Use bright outdoor lighting. Clear any walking paths of anything that might make someone trip, such as rocks or tools. Regularly check to see if handrails are loose or broken. Make sure that both sides of any steps have handrails. Any raised decks and porches should have guardrails on the edges. Have any leaves, snow, or ice cleared regularly. Use sand or salt on walking paths during winter. Clean up any spills in your garage right away. This includes oil or grease spills. What can I do in the bathroom? Use night lights. Install grab bars by the toilet and in the tub and shower. Do not use towel bars as grab bars. Use non-skid mats or decals in the tub or shower. If you need to sit down in the shower, use a plastic, non-slip stool. Keep the floor dry. Clean up any water that spills on the floor as soon as it happens. Remove soap buildup in the tub or shower regularly. Attach bath mats securely with double-sided non-slip rug tape. Do not have throw rugs and other things on the floor that can make you trip. What can I do in the bedroom? Use night lights. Make sure that you have a light by your bed that is easy to reach. Do not use any sheets or blankets that are too big for your bed. They should not hang  down onto the floor. Have a firm chair that has side arms. You can use this for support while you get dressed. Do not have throw rugs and other things on the floor that can make you trip. What can I do in the kitchen? Clean up any spills right away. Avoid walking on wet floors. Keep items that you use a lot in easy-to-reach places. If you need to reach something above you, use a strong step stool that has a grab bar. Keep electrical cords out of the way. Do not use floor polish or wax that makes floors slippery. If you must use wax, use non-skid floor wax. Do not have throw rugs and other things on the floor that can make you trip. What can I do with my stairs? Do not leave any items on the stairs. Make sure that there are handrails on both sides of the stairs and use them. Fix handrails that are broken or loose. Make sure that handrails are as long as the stairways. Check any carpeting to make sure that it is firmly attached to the stairs. Fix any carpet that is loose or worn. Avoid having throw rugs at the top or bottom of the stairs. If you do have throw rugs, attach them to the floor with carpet tape. Make sure that you have a light switch  at the top of the stairs and the bottom of the stairs. If you do not have them, ask someone to add them for you. What else can I do to help prevent falls? Wear shoes that: Do not have high heels. Have rubber bottoms. Are comfortable and fit you well. Are closed at the toe. Do not wear sandals. If you use a stepladder: Make sure that it is fully opened. Do not climb a closed stepladder. Make sure that both sides of the stepladder are locked into place. Ask someone to hold it for you, if possible. Clearly mark and make sure that you can see: Any grab bars or handrails. First and last steps. Where the edge of each step is. Use tools that help you move around (mobility aids) if they are needed. These  include: Canes. Walkers. Scooters. Crutches. Turn on the lights when you go into a dark area. Replace any light bulbs as soon as they burn out. Set up your furniture so you have a clear path. Avoid moving your furniture around. If any of your floors are uneven, fix them. If there are any pets around you, be aware of where they are. Review your medicines with your doctor. Some medicines can make you feel dizzy. This can increase your chance of falling. Ask your doctor what other things that you can do to help prevent falls. This information is not intended to replace advice given to you by your health care provider. Make sure you discuss any questions you have with your health care provider. Document Released: 12/11/2008 Document Revised: 07/23/2015 Document Reviewed: 03/21/2014 Elsevier Interactive Patient Education  2017 [employer].

## 2022-08-17 NOTE — Progress Notes (Signed)
Subjective:   Ann Wagner is a 77 y.o. female who presents for Medicare Annual (Subsequent) preventive examination.  Visit Complete: telephone  I connected with  Benetta Spar on 08/17/22 by a audio enabled telemedicine application and verified that I am speaking with the correct person using two identifiers.  Patient Location: Home  Provider Location: Home Office  I discussed the limitations of evaluation and management by telemedicine. The patient expressed understanding and agreed to proceed.  Patient Medicare AWV questionnaire was completed by the patient on 07-2022; I have confirmed that all information answered by patient is correct and no changes since this date.  Review of Systems     Cardiac Risk Factors include: advanced age (>50men, >81 women);family history of premature cardiovascular disease;hypertension     Objective:    Today's Vitals   There is no height or weight on file to calculate BMI.     08/17/2022    1:16 PM 09/22/2021    2:47 PM 06/26/2019    9:52 AM 04/18/2018   10:29 AM 04/05/2017    2:08 PM 12/16/2015    9:25 AM 01/12/2015    6:05 AM  Advanced Directives  Does Patient Have a Medical Advance Directive? Yes Yes Yes Yes Yes Yes No;Yes  Type of Estate agent of [employer] Power of [place];Living will Living will;Healthcare Power of [employer] Power of [place];Living will Living will;Healthcare Power of [employer] Power of [employer] Power of Attorney  Does patient want to make changes to medical advance directive?   No - Patient declined   No - Patient declined No - Patient declined  Copy of Healthcare Power of Attorney in Chart? No - copy requested No - copy requested No - copy requested No - copy requested No - copy requested No - copy requested No - copy requested    Current Medications (verified) Outpatient Encounter Medications as of 08/17/2022  Medication Sig   albuterol (PROVENTIL  HFA;VENTOLIN HFA) 108 (90 Base) MCG/ACT inhaler Inhale 2 puffs into the lungs every 6 (six) hours as needed for wheezing or shortness of breath.   amLODipine (NORVASC) 10 MG tablet TAKE 1 TABLET(10 MG) BY MOUTH DAILY   Apoaequorin (PREVAGEN EXTRA STRENGTH) 20 MG CAPS Take 20 mg by mouth daily.   aspirin EC 81 MG tablet Take 1 tablet (81 mg total) by mouth daily.   chlorthalidone (HYGROTON) 25 MG tablet Take 1 tablet (25 mg total) by mouth daily.   fenofibrate 160 MG tablet Take 1 tablet (160 mg total) by mouth daily.   fluticasone (FLONASE) 50 MCG/ACT nasal spray SHAKE LIQUID AND USE 2 SPRAYS IN EACH NOSTRIL DAILY   isosorbide mononitrate (IMDUR) 30 MG 24 hr tablet Take 1 tablet (30 mg total) by mouth daily.   levothyroxine (SYNTHROID) 88 MCG tablet TAKE 1 TABLET(88 MCG) BY MOUTH DAILY   lisinopril (ZESTRIL) 40 MG tablet TAKE 1 TABLET(40 MG) BY MOUTH DAILY   loratadine (CLARITIN) 10 MG tablet Take 10 mg by mouth daily.   methocarbamol (ROBAXIN) 500 MG tablet Take 1 tablet (500 mg total) by mouth every 8 (eight) hours as needed for muscle spasms.   nitroGLYCERIN (NITROSTAT) 0.4 MG SL tablet PLACE 1 TABLET UNDER TONGUE EVERY 5 MINUTES AS NEEDED FOR CHEST PAIN   pantoprazole (PROTONIX) 40 MG tablet TAKE 1 TABLET BY MOUTH DAILY AT NOON   PARoxetine (PAXIL) 10 MG tablet TAKE 1 TABLET(10 MG) BY MOUTH DAILY   potassium chloride (K-DUR) 10 MEQ tablet  Take 10 mEq by mouth once.   rosuvastatin (CRESTOR) 20 MG tablet Take 1 tablet (20 mg total) by mouth daily.   spironolactone (ALDACTONE) 25 MG tablet Take 1 tablet (25 mg total) by mouth daily.   No facility-administered encounter medications on file as of 08/17/2022.    Allergies (verified) Propoxyphene n-acetaminophen   History: Past Medical History:  Diagnosis Date   Allergic rhinitis    Arthritis    Coronary artery disease    cath 01/12/2015 single vessel CAD, DES to mid LAD   GERD (gastroesophageal reflux disease)    Headache    History of  chicken pox    Hyperlipidemia    Hypertension    Hyperthyroidism    Syncope    Past Surgical History:  Procedure Laterality Date   CARDIAC CATHETERIZATION N/A 01/12/2015   Procedure: Left Heart Cath and Coronary Angiography;  Surgeon: Peter M Swaziland, MD;  Location: [facility] INVASIVE CV LAB;  Service: Cardiovascular;  Laterality: N/A;   CARDIAC CATHETERIZATION Right 01/12/2015   Procedure: Coronary Stent Intervention;  Surgeon: Peter M Swaziland, MD;  Location: [facility] INVASIVE CV LAB;  Service: Cardiovascular;  Laterality: Right;   CORONARY STENT PLACEMENT  01/12/2015   des to lad   TUBAL LIGATION     Family History  Problem Relation Age of Onset   Coronary artery disease Mother    Hypertension Mother    Heart attack Mother    Diabetes Mother    Coronary artery disease Father    Hypertension Father    Coronary artery disease Paternal Grandmother    Hypertension Paternal Grandmother    Heart disease Son    Hypertension Son    Stroke Neg Hx    Cancer Neg Hx    Social History   Socioeconomic History   Marital status: Divorced    Spouse name: Not on file   Number of children: Not on file   Years of education: Not on file   Highest education level: Associate degree: academic program  Occupational History   Not on file  Tobacco Use   Smoking status: Former   Smokeless tobacco: Never  Building services engineer Use: Never used  Substance and Sexual Activity   Alcohol use: Yes    Comment: socially   Drug use: No   Sexual activity: Not on file  Other Topics Concern   Not on file  Social History Narrative   Lives alone.   2 sons - 1 locally with 5 kids.   Retired Engineer, agricultural, enjoys antique shows.   Social Determinants of Health   Financial Resource Strain: Low Risk  (08/17/2022)   Overall Financial Resource Strain (CARDIA)    Difficulty of Paying Living Expenses: Not hard at all  Food Insecurity: No Food Insecurity (08/17/2022)   Hunger Vital Sign    Worried About Running Out  of Food in the Last Year: Never true    Ran Out of Food in the Last Year: Never true  Transportation Needs: No Transportation Needs (08/17/2022)   PRAPARE - Administrator, Civil Service (Medical): No    Lack of Transportation (Non-Medical): No  Physical Activity: Insufficiently Active (08/17/2022)   Exercise Vital Sign    Days of Exercise per Week: 3 days    Minutes of Exercise per Session: 30 min  Stress: No Stress Concern Present (08/17/2022)   [employer] of Occupational Health - Occupational Stress Questionnaire    Feeling of Stress : Not at all  Social  Connections: Moderately Integrated (08/17/2022)   Social Connection and Isolation Panel [NHANES]    Frequency of Communication with Friends and Family: More than three times a week    Frequency of Social Gatherings with Friends and Family: Twice a week    Attends Religious Services: 1 to 4 times per year    Active Member of [employer] or Organizations: Yes    Attends Engineer, structural: More than 4 times per year    Marital Status: Divorced    Tobacco Counseling Counseling given: Not Answered   Clinical Intake:  Pre-visit preparation completed: Yes  Pain : No/denies pain     Diabetes: No  How often do you need to have someone help you when you read instructions, pamphlets, or other written materials from your doctor or pharmacy?: 1 - Never  Interpreter Needed?: No  Information entered by :: Remi Haggard LPN   Activities of Daily Living    08/17/2022    1:14 PM 08/15/2022    5:45 PM  In your present state of health, do you have any difficulty performing the following activities:  Hearing? 0 0  Vision? 1 1  Difficulty concentrating or making decisions? 0 0  Walking or climbing stairs? 0 0  Dressing or bathing? 0 0  Doing errands, shopping? 0 0  Preparing Food and eating ? N N  Using the Toilet? N N  In the past six months, have you accidently leaked urine? N N  Do you have problems with  loss of bowel control? N N  Managing your Medications? N N  Managing your Finances? N N  Housekeeping or managing your Housekeeping? N N    Patient Care Team: Sheliah Hatch, MD as PCP - General Meriam Sprague, MD as PCP - Cardiology (Cardiology) Lars Masson, MD as Consulting Physician (Cardiology) Dahlia Byes, [facility] (Inactive) as Pharmacist (Pharmacist) Blima Ledger, OD as Consulting Physician (Optometry)  Indicate any recent Medical Services you may have received from other than [facility] providers in the past year (date may be approximate).     Assessment:   This is a routine wellness examination for [place].  Hearing/Vision screen Hearing Screening - Comments:: Some trouble in crowds No hearing aids Vision Screening - Comments:: Cataract surgery Up to date Glaucoma left eye  Dietary issues and exercise activities discussed:     Goals Addressed             This Visit's Progress    Patient Stated       Stay healthy Continue current lifestyle       Depression Screen    08/17/2022    1:19 PM 11/12/2021   12:52 PM 09/22/2021    2:47 PM 09/22/2021    2:41 PM 07/07/2021   11:04 AM 08/04/2020    7:35 AM 06/26/2019   11:02 AM  PHQ 2/9 Scores  PHQ - 2 Score 0 0 0 0 0 0 0  PHQ- 9 Score 0 0   0 0     Fall Risk    08/17/2022    1:14 PM 08/15/2022    5:45 PM 11/12/2021   12:52 PM 09/22/2021    2:47 PM 09/22/2021    2:34 PM  Fall Risk   Falls in the past year? 0 0 1 0 1  Number falls in past yr: 0 0 0 0 0  Injury with Fall? 0 0 0 0 0  Follow up Falls evaluation completed;Education provided;Falls prevention discussed   Falls evaluation  completed;Education provided     MEDICARE RISK AT HOME:  Medicare Risk at Home - 08/17/22 1320     Any stairs in or around the home? Yes    If so, are there any without handrails? No    Home free of loose throw rugs in walkways, pet beds, electrical cords, etc? Yes    Adequate lighting in your home to reduce risk of  falls? Yes    Life alert? No    Use of a cane, walker or w/c? No    Grab bars in the bathroom? No    Shower chair or bench in shower? No    Elevated toilet seat or a handicapped toilet? No             TIMED UP AND GO:  Was the test performed?  No    Cognitive Function:    04/18/2018   10:30 AM  MMSE - Mini Mental State Exam  Orientation to time 5  Orientation to Place 5  Registration 3  Attention/ Calculation 5  Recall 2  Language- name 2 objects 2  Language- repeat 1  Language- follow 3 step command 3  Language- read & follow direction 1  Write a sentence 1  Copy design 1  Total score 29        08/17/2022    1:21 PM 06/26/2019   11:02 AM  6CIT Screen  What Year? 0 points 0 points  What month? 0 points 0 points  What time? 0 points 0 points  Count back from 20 0 points 0 points  Months in reverse 0 points 0 points  Repeat phrase 0 points 0 points  Total Score 0 points 0 points    Immunizations Immunization History  Administered Date(s) Administered   Influenza, High Dose Seasonal PF 11/20/2017, 11/09/2018   Influenza,inj,Quad PF,6+ Mos 11/29/2014   Influenza-Unspecified 12/29/2012, 10/29/2013, 12/17/2020   PFIZER(Purple Top)SARS-COV-2 Vaccination 04/10/2019, 05/01/2019, 12/17/2019   Pneumococcal Conjugate-13 04/30/2014   Pneumococcal Polysaccharide-23 04/05/2017   Td 08/23/2016   Zoster, Live 02/28/2010    TDAP status: Up to date  Flu Vaccine status: Up to date  Pneumococcal vaccine status: Up to date  Covid-19 vaccine status: Information provided on how to obtain vaccines.   Qualifies for Shingles Vaccine? Yes   Zostavax completed No   Shingrix Completed?: No.    Education has been provided regarding the importance of this vaccine. Patient has been advised to call insurance company to determine out of pocket expense if they have not yet received this vaccine. Advised may also receive vaccine at local pharmacy or Health Dept. Verbalized acceptance  and understanding.  Screening Tests Health Maintenance  Topic Date Due   INFLUENZA VACCINE  09/29/2022   Medicare Annual Wellness (AWV)  08/17/2023   DTaP/Tdap/Td (2 - Tdap) 08/24/2026   Pneumonia Vaccine 12+ Years old  Completed   DEXA SCAN  Completed   Hepatitis C Screening  Completed   HPV VACCINES  Aged Out   Colonoscopy  Discontinued   COVID-19 Vaccine  Discontinued   Zoster Vaccines- Shingrix  Discontinued    Health Maintenance  There are no preventive care reminders to display for this patient.   Colorectal cancer screening: No longer required.   Mammogram will schedule this year  Bone Density status: Completed 2019. Results reflect: Bone density results: NORMAL. Repeat every 3-5 years.  Lung Cancer Screening: (Low Dose CT Chest recommended if Age 65-80 years, 20 pack-year currently smoking OR have quit w/in 15years.) does  not qualify.   Lung Cancer Screening Referral:   Additional Screening:  Hepatitis C Screening: does not qualify; Completed 2022  Vision Screening: Recommended annual ophthalmology exams for early detection of glaucoma and other disorders of the eye. Is the patient up to date with their annual eye exam?  Yes  Who is the provider or what is the name of the office in which the patient attends annual eye exams?  If pt is not established with a provider, would they like to be referred to a provider to establish care? No .   Dental Screening: Recommended annual dental exams for proper oral hygiene    Community Resource Referral / Chronic Care Management: CRR required this visit?  No   CCM required this visit?  No     Plan:     I have personally reviewed and noted the following in the patient's chart:   Medical and social history Use of alcohol, tobacco or illicit drugs  Current medications and supplements including opioid prescriptions. Patient is not currently taking opioid prescriptions. Functional ability and status Nutritional  status Physical activity Advanced directives List of other physicians Hospitalizations, surgeries, and ER visits in previous 12 months Vitals Screenings to include cognitive, depression, and falls Referrals and appointments  In addition, I have reviewed and discussed with patient certain preventive protocols, quality metrics, and best practice recommendations. A written personalized care plan for preventive services as well as general preventive health recommendations were provided to patient.     Remi Haggard, LPN   1/61/0960   After Visit Summary: (MyChart) Due to this being a telephonic visit, the after visit summary with patients personalized plan was offered to patient via MyChart   Nurse Notes:   Patient is scheduled for MD appointment 08-18-2022 for ? UTI

## 2022-08-18 ENCOUNTER — Ambulatory Visit (INDEPENDENT_AMBULATORY_CARE_PROVIDER_SITE_OTHER): Payer: Medicare Other | Admitting: Family Medicine

## 2022-08-18 ENCOUNTER — Encounter: Payer: Self-pay | Admitting: Family Medicine

## 2022-08-18 VITALS — BP 124/62 | HR 71 | Temp 98.8°F | Resp 18 | Ht 65.0 in | Wt 168.1 lb

## 2022-08-18 DIAGNOSIS — R300 Dysuria: Secondary | ICD-10-CM

## 2022-08-18 DIAGNOSIS — I10 Essential (primary) hypertension: Secondary | ICD-10-CM

## 2022-08-18 DIAGNOSIS — E038 Other specified hypothyroidism: Secondary | ICD-10-CM

## 2022-08-18 DIAGNOSIS — K219 Gastro-esophageal reflux disease without esophagitis: Secondary | ICD-10-CM

## 2022-08-18 LAB — TSH: TSH: 2.92 u[IU]/mL (ref 0.35–5.50)

## 2022-08-18 LAB — POCT URINALYSIS DIPSTICK
Leukocytes, UA: NEGATIVE
Spec Grav, UA: 1.025 (ref 1.010–1.025)
Urobilinogen, UA: 0.2 E.U./dL
pH, UA: 5.5 (ref 5.0–8.0)

## 2022-08-18 LAB — CBC WITH DIFFERENTIAL/PLATELET
Basophils Absolute: 0 10*3/uL (ref 0.0–0.1)
Basophils Relative: 0.4 % (ref 0.0–3.0)
Eosinophils Absolute: 0.2 10*3/uL (ref 0.0–0.7)
Eosinophils Relative: 1.9 % (ref 0.0–5.0)
HCT: 39 % (ref 36.0–46.0)
Hemoglobin: 12.8 g/dL (ref 12.0–15.0)
Lymphocytes Relative: 27.2 % (ref 12.0–46.0)
Lymphs Abs: 2.8 10*3/uL (ref 0.7–4.0)
MCHC: 32.7 g/dL (ref 30.0–36.0)
MCV: 89.6 fl (ref 78.0–100.0)
Monocytes Absolute: 1.1 10*3/uL — ABNORMAL HIGH (ref 0.1–1.0)
Monocytes Relative: 10.8 % (ref 3.0–12.0)
Neutro Abs: 6.1 10*3/uL (ref 1.4–7.7)
Neutrophils Relative %: 59.7 % (ref 43.0–77.0)
Platelets: 305 10*3/uL (ref 150.0–400.0)
RBC: 4.35 Mil/uL (ref 3.87–5.11)
RDW: 13.1 % (ref 11.5–15.5)
WBC: 10.2 10*3/uL (ref 4.0–10.5)

## 2022-08-18 LAB — HEPATIC FUNCTION PANEL
ALT: 14 U/L (ref 0–35)
AST: 19 U/L (ref 0–37)
Albumin: 4.4 g/dL (ref 3.5–5.2)
Alkaline Phosphatase: 47 U/L (ref 39–117)
Bilirubin, Direct: 0.1 mg/dL (ref 0.0–0.3)
Total Bilirubin: 0.4 mg/dL (ref 0.2–1.2)
Total Protein: 7.4 g/dL (ref 6.0–8.3)

## 2022-08-18 LAB — LIPID PANEL
Cholesterol: 129 mg/dL (ref 0–200)
HDL: 49.6 mg/dL (ref >39.00)
LDL Cholesterol: 58 mg/dL (ref 0–99)
NonHDL: 79.17
Total CHOL/HDL Ratio: 3
Triglycerides: 105 mg/dL (ref 0.0–149.0)
VLDL: 21 mg/dL (ref 0.0–40.0)

## 2022-08-18 LAB — BASIC METABOLIC PANEL
BUN: 36 mg/dL — ABNORMAL HIGH (ref 6–23)
CO2: 28 mEq/L (ref 19–32)
Calcium: 10 mg/dL (ref 8.4–10.5)
Chloride: 103 mEq/L (ref 96–112)
Creatinine, Ser: 1.23 mg/dL — ABNORMAL HIGH (ref 0.40–1.20)
GFR: 42.33 mL/min — ABNORMAL LOW (ref >60.00)
Glucose, Bld: 95 mg/dL (ref 70–99)
Potassium: 4.1 mEq/L (ref 3.5–5.1)
Sodium: 140 mEq/L (ref 135–145)

## 2022-08-18 MED ORDER — CEPHALEXIN 500 MG PO CAPS: 500.0000 mg | ORAL_CAPSULE | Freq: Two times a day (BID) | ORAL | 0 refills | Status: AC

## 2022-08-18 MED ORDER — LEVOTHYROXINE SODIUM 88 MCG PO TABS
ORAL_TABLET | ORAL | 1 refills | Status: DC
Start: 2022-08-18 — End: 2023-02-10

## 2022-08-18 MED ORDER — PANTOPRAZOLE SODIUM 40 MG PO TBEC
DELAYED_RELEASE_TABLET | ORAL | 1 refills | Status: DC
Start: 2022-08-18 — End: 2023-03-23

## 2022-08-18 MED ORDER — PAROXETINE HCL 10 MG PO TABS: ORAL_TABLET | ORAL | 1 refills | Status: DC

## 2022-08-18 NOTE — Patient Instructions (Addendum)
Follow up as needed or as scheduled We'll notify you of your lab results and make any changes if needed Continue to drink LOTS of water Take the Cephalexin twice daily for possible infection IF your symptoms change, worsen, or fail to improve- let us know Call with any questions or concerns SAFE TRAVELS!!!!

## 2022-08-18 NOTE — Progress Notes (Signed)
   Subjective:    Patient ID: Ann Wagner, female    DOB: 07/20/1945, 77 y.o.   MRN: [id number]  HPI Dysuria- pt reports she is concerned for possible UTI or 'very small kidney stone'.  Sxs started on Monday.  Had cloudy urine.  Some suprapubic discomfort- 'it's just really sore'.  No change in frequency.  + odor.  No fevers or chills.  No N/V.  Pt is leaving Monday for 'several months'.  HTN- chronic problem, on Lisinopril 40mg  daily, Spironolactone 25mg  daily, chlorthalidone 25mg  daily, and Amlodipine 10mg  daily.  BP is well controlled.  Pt denies CP, SOB, HA's, edema.  Saw Cardiology but PA did not do blood work and this made pt uncomfortable.  Asking for labs today.   Review of Systems For ROS see HPI     Objective:   Physical Exam Vitals reviewed.  Constitutional:      Appearance: Normal appearance.  HENT:     Head: Normocephalic and atraumatic.  Eyes:     Extraocular Movements: Extraocular movements intact.     Conjunctiva/sclera: Conjunctivae normal.  Cardiovascular:     Rate and Rhythm: Normal rate and regular rhythm.  Pulmonary:     Effort: Pulmonary effort is normal. No respiratory distress.  Abdominal:     Palpations: Abdomen is soft.     Tenderness: There is abdominal tenderness (mild TTP over suprapubic area). There is no right CVA tenderness or left CVA tenderness.  Skin:    General: Skin is warm and dry.  Neurological:     General: No focal deficit present.     Mental Status: She is alert and oriented to person, place, and time.  Psychiatric:        Mood and Affect: Mood normal.        Behavior: Behavior normal.        Thought Content: Thought content normal.           Assessment & Plan:  Burning w/ urination- new.  Pt reports sxs started Monday.  No hematuria.  UA in office WNL but these are not always accurate.  Given her sxs and upcoming travel, will start Keflex while awaiting urine culture results.  Will stop or adjust meds accordingly.  Pt  expressed understanding and is in agreement w/ plan.

## 2022-08-18 NOTE — Assessment & Plan Note (Signed)
Chronic problem.  Well controlled on Lisinopril, Spironolactone, Chlorthalidone, Amlodipine.  Will get labs due to ACE and diuretic use.  No anticipated med changes at this time.

## 2022-08-19 ENCOUNTER — Telehealth: Payer: Self-pay

## 2022-08-19 LAB — URINE CULTURE
MICRO NUMBER:: 15107311
SPECIMEN QUALITY:: ADEQUATE

## 2022-08-19 NOTE — Telephone Encounter (Signed)
-----   Message from Sheliah Hatch, MD sent at 08/19/2022  7:35 AM EDT ----- Labs look great w/ exception of mildly increased Creatinine- which in turn leads to a decreased GFR (these are markers of kidney function).  Please increase your water intake and decrease use of ibuprofen, Aleve, Motrin, or other anti-inflammatories.  We will repeat this level when you get back from [facility]

## 2022-08-19 NOTE — Telephone Encounter (Signed)
Pt is aware of lab results.

## 2022-08-22 ENCOUNTER — Telehealth: Payer: Self-pay

## 2022-08-22 NOTE — Telephone Encounter (Signed)
Left lab results on pt VM 

## 2022-08-22 NOTE — Telephone Encounter (Signed)
-----   Message from Sheliah Hatch, MD sent at 08/20/2022  9:18 AM EDT ----- No obvious UTI.  How are you feeling?

## 2022-10-04 ENCOUNTER — Other Ambulatory Visit: Payer: Self-pay

## 2022-10-04 MED ORDER — PAROXETINE HCL 10 MG PO TABS: ORAL_TABLET | ORAL | 1 refills | Status: DC

## 2022-12-12 ENCOUNTER — Telehealth: Payer: Self-pay | Admitting: Family Medicine

## 2022-12-12 ENCOUNTER — Telehealth: Payer: Self-pay | Admitting: Physician Assistant

## 2022-12-12 ENCOUNTER — Other Ambulatory Visit: Payer: Self-pay

## 2022-12-12 DIAGNOSIS — I10 Essential (primary) hypertension: Secondary | ICD-10-CM

## 2022-12-12 MED ORDER — LISINOPRIL 40 MG PO TABS: 40.0000 mg | ORAL_TABLET | Freq: Every day | ORAL | 1 refills | Status: DC

## 2022-12-12 MED ORDER — AMLODIPINE BESYLATE 10 MG PO TABS: ORAL_TABLET | ORAL | 1 refills | Status: DC

## 2022-12-12 NOTE — Telephone Encounter (Signed)
Patient has appointment scheduled on 03/03/23.

## 2022-12-12 NOTE — Telephone Encounter (Signed)
*  STAT* If patient is at the pharmacy, call can be transferred to refill team.   1. Which medications need to be refilled? (please list name of each medication and dose if known) amLODipine (NORVASC) 10 MG tablet  isosorbide mononitrate (IMDUR) 30 MG 24 hr tablet ; chlorthalidone (HYGROTON) 25 MG tablet ; lisinopril (ZESTRIL) 40 MG tablet   2. Would you like to learn more about the convenience, safety, & potential cost savings by using the [facility]? No      3. Are you open to using the [facility] (Type [facility]. No  ).   4. Which pharmacy/location (including street and city if local pharmacy) is medication to be sent to? [id number] - [place], [place] - [address] AT [facility] OF [address]     5. Do they need a 30 day or 90 day supply? 90

## 2022-12-12 NOTE — Telephone Encounter (Signed)
Last Office visit 08/18/2022 Last refill 06/14/2022 90 days 1 refill

## 2022-12-12 NOTE — Telephone Encounter (Signed)
lisinopril (ZESTRIL) 40 MG tablet  Last refill  05/31/2022   Pt has been notified

## 2022-12-12 NOTE — Telephone Encounter (Signed)
Patient called again stating she will be going out of town and will need this medication today.  Patient stated she is out of these medications.

## 2022-12-12 NOTE — Telephone Encounter (Signed)
Encourage patient to contact the pharmacy for refills or they can request refills through [facility]  (Please schedule appointment if patient has not been seen in over a year)    WHAT PHARMACY WOULD THEY LIKE THIS SENT TO: [id number] - [place], [place] - [address] AT [facility] [address]   MEDICATION NAME & DOSE: amLODipine (NORVASC) 10 MG tablet  lisinopril (ZESTRIL) 40 MG tablet   NOTES/COMMENTS FROM PATIENT:  Pt states the refills should have been sent in from pharmacy on 12/09/22, we did not receive the fax. Pt states she is leaving 10/15 to go to [place] and requested if possible to get these meds filled today because she only has 2 of her amlodipine left.     Front office please notify patient: It takes 48-72 hours to process rx refill requests Ask patient to call pharmacy to ensure rx is ready before heading there.

## 2022-12-13 ENCOUNTER — Other Ambulatory Visit: Payer: Self-pay

## 2022-12-13 MED ORDER — ISOSORBIDE MONONITRATE ER 30 MG PO TB24: 30.0000 mg | ORAL_TABLET | Freq: Every day | ORAL | 1 refills | Status: DC

## 2022-12-13 MED ORDER — CHLORTHALIDONE 25 MG PO TABS: 25.0000 mg | ORAL_TABLET | Freq: Every day | ORAL | 1 refills | Status: DC

## 2022-12-23 DIAGNOSIS — H26493 Other secondary cataract, bilateral: Secondary | ICD-10-CM | POA: Diagnosis not present

## 2022-12-23 DIAGNOSIS — Z961 Presence of intraocular lens: Secondary | ICD-10-CM | POA: Diagnosis not present

## 2023-02-10 ENCOUNTER — Other Ambulatory Visit: Payer: Self-pay | Admitting: Family Medicine

## 2023-02-10 DIAGNOSIS — E038 Other specified hypothyroidism: Secondary | ICD-10-CM

## 2023-03-03 ENCOUNTER — Ambulatory Visit: Payer: Medicare Other | Attending: Cardiology | Admitting: Cardiology

## 2023-03-03 ENCOUNTER — Encounter: Payer: Self-pay | Admitting: Cardiology

## 2023-03-03 VITALS — BP 136/60 | HR 44 | Ht 65.0 in | Wt 173.4 lb

## 2023-03-03 DIAGNOSIS — R001 Bradycardia, unspecified: Secondary | ICD-10-CM

## 2023-03-03 DIAGNOSIS — I6523 Occlusion and stenosis of bilateral carotid arteries: Secondary | ICD-10-CM | POA: Diagnosis not present

## 2023-03-03 DIAGNOSIS — I2510 Atherosclerotic heart disease of native coronary artery without angina pectoris: Secondary | ICD-10-CM | POA: Diagnosis not present

## 2023-03-03 DIAGNOSIS — I10 Essential (primary) hypertension: Secondary | ICD-10-CM

## 2023-03-03 DIAGNOSIS — E782 Mixed hyperlipidemia: Secondary | ICD-10-CM | POA: Diagnosis not present

## 2023-03-03 NOTE — Progress Notes (Signed)
 Cardiology Office Note:   Date:  03/03/2023  ID:  Ann Wagner, DOB 08-02-1945, MRN [id number] PCP: Mahlon Comer BRAVO, MD  [facility] Providers Cardiologist:  Powell BRAVO Sorrow, MD (Inactive)    History of Present Illness:   Discussed the use of AI scribe software for clinical note transcription with the patient, who gave verbal consent to proceed.  History of Present Illness   The patient is a 78 year old individual with a history of coronary artery disease, including stenting of the left anterior descending artery in 2016, hypertension, hyperlipidemia, bilateral carotid artery disease, vasovagal syncope, and chronic bradycardia. Over the past six months, the patient reports feeling well with no significant cardiac symptoms. She maintains an active lifestyle, participating in antique shows which involve prolonged standing and lifting. Despite these activities, the patient reports no chest pain or discomfort.  The patient's resting heart rate, as measured by a Fitbit, is typically in the 40s to 50s, with a maximum of 62. The patient does not monitor her heart rate during physical activity. She reports occasional, mild dizziness when tired, but denies any significant episodes of lightheadedness or dizziness during activity.  The patient denies any recent chest pain and has not needed to use her emergency nitroglycerin . She is on daily Imdur  which appears to be effective. The patient denies any significant headaches, but reports occasional tension in the neck and shoulders, particularly after exertion.  The patient denies any recent shortness of breath beyond what she considers usual for her activity level. She also denies any nocturnal dyspnea or difficulty laying flat. There is no reported leg swelling, although the patient notes that one leg is slightly larger than the other, which she attributes to a previous injury.  The patient's medication regimen includes daily aspirin ,  fenofibrate , rosuvastatin , amlodipine , lisinopril , and spironolactone . She also takes isosorbide  (Imdur ) for her coronary artery disease. The patient does not regularly monitor her blood pressure at home due to difficulties with her home blood pressure monitor.  The patient has a history of sciatica, for which she occasionally takes ibuprofen , despite a slight elevation in creatinine noted in June. She is aware of the potential kidney effects of ibuprofen  and limits her use to twice a week. The patient also takes a medication for nerve pain related to her sciatica, which she reports is effective but expensive.  In summary, this is a 78 year old individual with a history of coronary artery disease, hypertension, hyperlipidemia, bilateral carotid artery disease, vasovagal syncope, and chronic bradycardia. She reports feeling well with no significant cardiac symptoms and maintains an active lifestyle. The patient's medication regimen appears to be effective, and she is aware of the need to monitor her blood pressure and limit her use of ibuprofen  due to her slightly elevated creatinine.         Today patient denies chest pain, shortness of breath, lower extremity edema, fatigue, palpitations, melena, hematuria, hemoptysis, diaphoresis, weakness, presyncope, syncope, orthopnea, and PND.   Studies Reviewed:    05/09/22 Carotid Duplex  Summary:  Right Carotid: Velocities in the right ICA are consistent with a 40-59%                 stenosis. The ECA appears >50% stenosed.   Left Carotid: Velocities in the left ICA are consistent with a 1-39%  stenosis.   Vertebrals: Bilateral vertebral arteries demonstrate antegrade flow.  Subclavians: Normal flow hemodynamics were seen in bilateral subclavian  arteries.   *See table(s) above for measurements and observations.  Suggest follow up study in 12 months.   Risk Assessment/Calculations:              Physical Exam:   VS:  BP 136/60    Pulse (!) 44   Ht 5' 5 (1.651 m)   Wt 173 lb 6.4 oz (78.7 kg)   SpO2 95%   BMI 28.86 kg/m    Wt Readings from Last 3 Encounters:  03/03/23 173 lb 6.4 oz (78.7 kg)  08/18/22 168 lb 2 oz (76.3 kg)  06/21/22 171 lb 12.8 oz (77.9 kg)     Physical Exam Vitals reviewed.  Constitutional:      Appearance: Normal appearance.  HENT:     Head: Normocephalic.     Nose: Nose normal.  Eyes:     Pupils: Pupils are equal, round, and reactive to light.  Neck:     Vascular: Carotid bruit (right) present.  Cardiovascular:     Rate and Rhythm: Normal rate and regular rhythm.     Pulses: Normal pulses.     Heart sounds: Normal heart sounds. No murmur heard.    No friction rub. No gallop.  Pulmonary:     Effort: Pulmonary effort is normal.     Breath sounds: Normal breath sounds.  Musculoskeletal:     Right lower leg: No edema.     Left lower leg: No edema.  Skin:    General: Skin is warm and dry.     Capillary Refill: Capillary refill takes less than 2 seconds.  Neurological:     General: No focal deficit present.     Mental Status: She is alert and oriented to person, place, and time.  Psychiatric:        Mood and Affect: Mood normal.        Behavior: Behavior normal.        Thought Content: Thought content normal.        Judgment: Judgment normal.     ASSESSMENT AND PLAN:     Assessment and Plan    Coronary Artery Disease Coronary artery disease with prior stenting to the left anterior descending artery in 2016. Currently asymptomatic with no recent chest pain or dyspnea. Blood pressure slightly elevated but stable compared to last visit. Goal is to maintain LDL cholesterol below 70, ideally below 55, to reduce cardiovascular risk. - Continue daily aspirin  - Continue rosuvastatin  20 mg - Continue fenofibrate  - Continue as-needed nitroglycerin  - Continue isosorbide  mononitrate 30 mg - Monitor blood pressure at home - Follow up in one year unless symptoms  arise  Hyperlipidemia LDL cholesterol was 58 in June 2024, well-controlled with current medications. Goal is to maintain LDL below 70, ideally below 55, to reduce cardiovascular risk. - Continue rosuvastatin  20 mg - Continue fenofibrate  - Annual lipid panel  Hypertension Blood pressure today is 136/60, slightly elevated compared to the goal of <130/80. Patient does not regularly monitor blood pressure at home. Discussed the importance of regular home monitoring to prevent complications such as heart failure. Recommended using an upper arm cuff for accurate readings. - Continue amlodipine  10 mg - Continue lisinopril  40 mg - Continue spironolactone  - Encourage regular home blood pressure monitoring with a reliable cuff (Omron recommended) - Call in one month to review home blood pressure readings  Bilateral Carotid Artery Disease Bilateral carotid artery disease with right-sided bruit. March 2024 US  showed 40-59% right ICA stenosis and 1-39% left ICA stenosis.  -  carotid ultrasound in March 2025  Chronic Bradycardia Longstanding bradycardia with resting heart rate around 49-62 bpm per patient monitoring. No symptoms of dizziness or lightheadedness reported. Emphasized monitoring for symptoms such as dizziness or fatigue, which could indicate worsening bradycardia. - Monitor for symptoms of dizziness or fatigue - Follow up in one year unless symptoms arise  General Health Maintenance Discussed the importance of regular monitoring and follow-up for chronic conditions. Emphasized the need for regular home blood pressure monitoring to prevent complications. - Encourage regular home blood pressure monitoring - Annual lipid panel - Order carotid ultrasound in March 2025  Follow-up - Follow up in one year with Dr. Loni - Call in one month to review home blood pressure readings.             Signed, Artist Pouch, PA-C

## 2023-03-03 NOTE — Patient Instructions (Addendum)
 Medication Instructions:  The current medical regimen is effective. Continue present plan and medications as directed. Please refer to the Current Medication list given to you today.     *If you need a refill on your cardiac medications before your next appointment, please call your pharmacy*   Lab Work: No labs were ordered during today's visit.  If you have labs (blood work) drawn today and your tests are completely normal, you will receive your results only by: MyChart Message (if you have MyChart) OR A paper copy in the mail If you have any lab test that is abnormal or we need to change your treatment, we will call you to review the results.   Testing/Procedures: No procedures ordered today.    Follow-Up: At [facility], you and your health needs are our priority.  As part of our continuing mission to provide you with exceptional heart care, we have created designated Provider Care Teams.  These Care Teams include your primary Cardiologist (physician) and Advanced Practice Providers (APPs -  Physician Assistants and Nurse Practitioners) who all work together to provide you with the care you need, when you need it.  We recommend signing up for the patient portal called MyChart.  Sign up information is provided on this After Visit Summary.  MyChart is used to connect with patients for Virtual Visits (Telemedicine).  Patients are able to view lab/test results, encounter notes, upcoming appointments, etc.  Non-urgent messages can be sent to your provider as well.   To learn more about what you can do with MyChart, go to [email or web address].    Your next appointment:   6 month(s)  Provider:   Dr. Soyla Merck  Other Instructions Thank you for choosing [facility]!

## 2023-03-04 DIAGNOSIS — J019 Acute sinusitis, unspecified: Secondary | ICD-10-CM | POA: Diagnosis not present

## 2023-03-04 DIAGNOSIS — J069 Acute upper respiratory infection, unspecified: Secondary | ICD-10-CM | POA: Diagnosis not present

## 2023-03-04 DIAGNOSIS — Z20822 Contact with and (suspected) exposure to covid-19: Secondary | ICD-10-CM | POA: Diagnosis not present

## 2023-03-23 ENCOUNTER — Other Ambulatory Visit: Payer: Self-pay

## 2023-03-23 DIAGNOSIS — K219 Gastro-esophageal reflux disease without esophagitis: Secondary | ICD-10-CM

## 2023-03-23 MED ORDER — PANTOPRAZOLE SODIUM 40 MG PO TBEC: DELAYED_RELEASE_TABLET | ORAL | 1 refills | Status: DC

## 2023-03-27 ENCOUNTER — Other Ambulatory Visit: Payer: Self-pay

## 2023-03-27 DIAGNOSIS — R079 Chest pain, unspecified: Secondary | ICD-10-CM

## 2023-03-27 DIAGNOSIS — I10 Essential (primary) hypertension: Secondary | ICD-10-CM

## 2023-03-27 DIAGNOSIS — Z79899 Other long term (current) drug therapy: Secondary | ICD-10-CM

## 2023-03-27 MED ORDER — SPIRONOLACTONE 25 MG PO TABS: 25.0000 mg | ORAL_TABLET | Freq: Every day | ORAL | 3 refills | Status: DC

## 2023-05-05 DIAGNOSIS — H401131 Primary open-angle glaucoma, bilateral, mild stage: Secondary | ICD-10-CM | POA: Diagnosis not present

## 2023-06-01 ENCOUNTER — Other Ambulatory Visit: Payer: Self-pay | Admitting: Family Medicine

## 2023-06-01 DIAGNOSIS — I10 Essential (primary) hypertension: Secondary | ICD-10-CM

## 2023-06-02 ENCOUNTER — Other Ambulatory Visit: Payer: Self-pay | Admitting: Family Medicine

## 2023-06-02 DIAGNOSIS — I10 Essential (primary) hypertension: Secondary | ICD-10-CM

## 2023-06-05 ENCOUNTER — Other Ambulatory Visit: Payer: Self-pay | Admitting: Physician Assistant

## 2023-06-20 ENCOUNTER — Other Ambulatory Visit: Payer: Self-pay | Admitting: Physician Assistant

## 2023-07-20 ENCOUNTER — Encounter: Payer: Self-pay | Admitting: Internal Medicine

## 2023-07-31 ENCOUNTER — Other Ambulatory Visit: Payer: Self-pay

## 2023-07-31 DIAGNOSIS — I6523 Occlusion and stenosis of bilateral carotid arteries: Secondary | ICD-10-CM

## 2023-07-31 DIAGNOSIS — I10 Essential (primary) hypertension: Secondary | ICD-10-CM

## 2023-07-31 DIAGNOSIS — E782 Mixed hyperlipidemia: Secondary | ICD-10-CM

## 2023-07-31 DIAGNOSIS — I2510 Atherosclerotic heart disease of native coronary artery without angina pectoris: Secondary | ICD-10-CM

## 2023-07-31 MED ORDER — ROSUVASTATIN CALCIUM 20 MG PO TABS: 20.0000 mg | ORAL_TABLET | Freq: Every day | ORAL | 1 refills | Status: DC

## 2023-08-03 ENCOUNTER — Other Ambulatory Visit: Payer: Self-pay | Admitting: Family Medicine

## 2023-08-03 DIAGNOSIS — E038 Other specified hypothyroidism: Secondary | ICD-10-CM

## 2023-08-04 DIAGNOSIS — H401121 Primary open-angle glaucoma, left eye, mild stage: Secondary | ICD-10-CM | POA: Diagnosis not present

## 2023-08-05 ENCOUNTER — Other Ambulatory Visit: Payer: Self-pay

## 2023-08-05 ENCOUNTER — Emergency Department (HOSPITAL_BASED_OUTPATIENT_CLINIC_OR_DEPARTMENT_OTHER)

## 2023-08-05 ENCOUNTER — Emergency Department (HOSPITAL_BASED_OUTPATIENT_CLINIC_OR_DEPARTMENT_OTHER)
Admission: EM | Admit: 2023-08-05 | Discharge: 2023-08-05 | Disposition: A | Discharge status: Home / Self Care | Attending: Emergency Medicine | Admitting: Emergency Medicine

## 2023-08-05 ENCOUNTER — Encounter (HOSPITAL_BASED_OUTPATIENT_CLINIC_OR_DEPARTMENT_OTHER): Payer: Self-pay | Admitting: *Deleted

## 2023-08-05 DIAGNOSIS — R7989 Other specified abnormal findings of blood chemistry: Secondary | ICD-10-CM | POA: Insufficient documentation

## 2023-08-05 DIAGNOSIS — Z79899 Other long term (current) drug therapy: Secondary | ICD-10-CM | POA: Diagnosis not present

## 2023-08-05 DIAGNOSIS — N3000 Acute cystitis without hematuria: Secondary | ICD-10-CM | POA: Diagnosis not present

## 2023-08-05 DIAGNOSIS — I10 Essential (primary) hypertension: Secondary | ICD-10-CM | POA: Diagnosis not present

## 2023-08-05 DIAGNOSIS — R1030 Lower abdominal pain, unspecified: Secondary | ICD-10-CM | POA: Diagnosis present

## 2023-08-05 DIAGNOSIS — D72829 Elevated white blood cell count, unspecified: Secondary | ICD-10-CM | POA: Diagnosis not present

## 2023-08-05 DIAGNOSIS — K5730 Diverticulosis of large intestine without perforation or abscess without bleeding: Secondary | ICD-10-CM | POA: Diagnosis not present

## 2023-08-05 DIAGNOSIS — I2510 Atherosclerotic heart disease of native coronary artery without angina pectoris: Secondary | ICD-10-CM | POA: Diagnosis not present

## 2023-08-05 DIAGNOSIS — Z7982 Long term (current) use of aspirin: Secondary | ICD-10-CM | POA: Insufficient documentation

## 2023-08-05 DIAGNOSIS — R109 Unspecified abdominal pain: Secondary | ICD-10-CM | POA: Diagnosis not present

## 2023-08-05 DIAGNOSIS — R001 Bradycardia, unspecified: Secondary | ICD-10-CM | POA: Diagnosis not present

## 2023-08-05 DIAGNOSIS — K5900 Constipation, unspecified: Secondary | ICD-10-CM | POA: Diagnosis not present

## 2023-08-05 LAB — COMPREHENSIVE METABOLIC PANEL WITH GFR
ALT: 20 U/L (ref 0–44)
AST: 29 U/L (ref 15–41)
Albumin: 4.7 g/dL (ref 3.5–5.0)
Alkaline Phosphatase: 77 U/L (ref 38–126)
Anion gap: 15 (ref 5–15)
BUN: 31 mg/dL — ABNORMAL HIGH (ref 8–23)
CO2: 23 mmol/L (ref 22–32)
Calcium: 10.3 mg/dL (ref 8.9–10.3)
Chloride: 101 mmol/L (ref 98–111)
Creatinine, Ser: 1.6 mg/dL — ABNORMAL HIGH (ref 0.44–1.00)
GFR, Estimated: 33 mL/min — ABNORMAL LOW (ref >60)
Glucose, Bld: 137 mg/dL — ABNORMAL HIGH (ref 70–99)
Potassium: 4.3 mmol/L (ref 3.5–5.1)
Sodium: 139 mmol/L (ref 135–145)
Total Bilirubin: 0.4 mg/dL (ref 0.0–1.2)
Total Protein: 7.3 g/dL (ref 6.5–8.1)

## 2023-08-05 LAB — CBC
HCT: 39.4 % (ref 36.0–46.0)
Hemoglobin: 12.9 g/dL (ref 12.0–15.0)
MCH: 29.3 pg (ref 26.0–34.0)
MCHC: 32.7 g/dL (ref 30.0–36.0)
MCV: 89.3 fL (ref 80.0–100.0)
Platelets: 344 10*3/uL (ref 150–400)
RBC: 4.41 MIL/uL (ref 3.87–5.11)
RDW: 12.9 % (ref 11.5–15.5)
WBC: 22.6 10*3/uL — ABNORMAL HIGH (ref 4.0–10.5)
nRBC: 0 % (ref 0.0–0.2)

## 2023-08-05 LAB — LIPASE, BLOOD: Lipase: 38 U/L (ref 11–51)

## 2023-08-05 LAB — URINALYSIS, ROUTINE W REFLEX MICROSCOPIC
Bacteria, UA: NONE SEEN
Glucose, UA: NEGATIVE mg/dL
Nitrite: NEGATIVE
Protein, ur: 100 mg/dL — AB
Specific Gravity, Urine: 1.025 (ref 1.005–1.030)
pH: 5.5 (ref 5.0–8.0)

## 2023-08-05 LAB — LACTIC ACID, PLASMA: Lactic Acid, Venous: 1.3 mmol/L (ref 0.5–1.9)

## 2023-08-05 MED ORDER — ONDANSETRON 4 MG PO TBDP: ORAL_TABLET | ORAL | 0 refills | Status: AC

## 2023-08-05 MED ORDER — CEPHALEXIN 500 MG PO CAPS: 500.0000 mg | ORAL_CAPSULE | Freq: Two times a day (BID) | ORAL | 0 refills | Status: DC

## 2023-08-05 MED ORDER — SODIUM CHLORIDE 0.9 % IV SOLN
1.0000 g | Freq: Once | INTRAVENOUS | Status: AC
  Administered 2023-08-05: 1 g via INTRAVENOUS
  Filled 2023-08-05: qty 10

## 2023-08-05 MED ORDER — ONDANSETRON HCL 4 MG/2ML IJ SOLN
4.0000 mg | Freq: Once | INTRAMUSCULAR | Status: AC
  Administered 2023-08-05: 4 mg via INTRAVENOUS
  Filled 2023-08-05: qty 2

## 2023-08-05 MED ORDER — SODIUM CHLORIDE 0.9 % IV BOLUS
500.0000 mL | Freq: Once | INTRAVENOUS | Status: AC
  Administered 2023-08-05: 500 mL via INTRAVENOUS

## 2023-08-05 NOTE — Discharge Instructions (Addendum)
 Make an appoint to have close follow-up with your primary care doctor.  You will need to have your creatinine rechecked as it is slightly more elevated than your prior values.  Take all the antibiotics as discussed.  Return to the emergency room if you have any worsening symptoms.

## 2023-08-05 NOTE — ED Notes (Signed)
 Pt provided with grahm crackers and water for po challenge

## 2023-08-05 NOTE — ED Triage Notes (Signed)
 Lower abd pain starting today. Pain is intermittent and reportedly similar to kidney stone pain patient has had in the past. Patient reports feeling nauseous with intermittent diaphoretic moments.

## 2023-08-05 NOTE — ED Provider Notes (Signed)
 [facility] [facility] AT [facility] Provider Note   CSN: [id number] Arrival date & time: 08/05/23  2041     History  Chief Complaint  Patient presents with   Abdominal Pain    Ann Wagner is a 78 y.o. female.  Patient is a 78 year old female who presents with abdominal pain.  She said she has had little twinges of pain in her right mid abdomen intermittently for the last week.  She now has some pain in her lower abdomen is concerned she may have a urinary tract infection.  This just started today.  She has also had some nausea and 1 episode of vomiting throughout today with decreased appetite.  She has felt some chills but no definite fevers.  No change in her stools.  She has some postnasal drip which is chronic for her and unchanged.  No cough or chest congestion.  She has had some dark-colored urine but no burning on urination or frequency.       Home Medications Prior to Admission medications   Medication Sig Start Date End Date Taking? Authorizing Provider  cephALEXin  (KEFLEX ) 500 MG capsule Take 1 capsule (500 mg total) by mouth 2 (two) times daily. 08/05/23  Yes Hershel Los, MD  albuterol  (PROVENTIL  HFA;VENTOLIN  HFA) 108 (90 Base) MCG/ACT inhaler Inhale 2 puffs into the lungs every 6 (six) hours as needed for wheezing or shortness of breath. 12/12/16   Farris Hong, PA-C  amLODipine  (NORVASC ) 10 MG tablet TAKE 1 TABLET(10 MG) BY MOUTH DAILY 06/01/23   [name], [name], MD  Apoaequorin (PREVAGEN EXTRA STRENGTH) 20 MG CAPS Take 20 mg by mouth daily.    [provider]  aspirin  EC 81 MG tablet Take 1 tablet (81 mg total) by mouth daily. 08/04/14   Liza Riggers, MD  chlorthalidone  (HYGROTON ) 25 MG tablet TAKE 1 TABLET(25 MG) BY MOUTH DAILY 06/06/23   [name], [name], PA-C  fenofibrate  160 MG tablet Take 1 tablet (160 mg total) by mouth daily. 06/22/23   Leala Prince, PA-C  FLUARIX 0.5 ML injection Inject 0.5 mLs into the muscle once. 11/04/22    [provider]  fluticasone  (FLONASE ) 50 MCG/ACT nasal spray SHAKE LIQUID AND USE 2 SPRAYS IN EACH NOSTRIL DAILY 06/14/17   Farris Hong, PA-C  isosorbide  mononitrate (IMDUR ) 30 MG 24 hr tablet Take 1 tablet (30 mg total) by mouth daily. 06/06/23   Leala Prince, PA-C  levothyroxine  (SYNTHROID ) 88 MCG tablet TAKE 1 TABLET(88 MCG) BY MOUTH DAILY 08/03/23   Jess Morita, MD  lisinopril  (ZESTRIL ) 40 MG tablet TAKE 1 TABLET(40 MG) BY MOUTH DAILY 06/02/23   [name], [name], MD  loratadine (CLARITIN) 10 MG tablet Take 10 mg by mouth daily.    [provider]  methocarbamol  (ROBAXIN ) 500 MG tablet Take 1 tablet (500 mg total) by mouth every 8 (eight) hours as needed for muscle spasms. 06/03/21   [name], [name], MD  nitroGLYCERIN  (NITROSTAT ) 0.4 MG SL tablet PLACE 1 TABLET UNDER TONGUE EVERY 5 MINUTES AS NEEDED FOR CHEST PAIN 06/15/21   Sonny Dust, MD  pantoprazole  (PROTONIX ) 40 MG tablet TAKE 1 TABLET BY MOUTH DAILY AT NOON 03/23/23   [name], [name], MD  PARoxetine  (PAXIL ) 10 MG tablet TAKE 1 TABLET(10 MG) BY MOUTH DAILY 10/04/22   [name], [name], MD  potassium chloride (K-DUR) 10 MEQ tablet Take 10 mEq by mouth once.    [provider]  rosuvastatin  (CRESTOR ) 20 MG tablet Take  1 tablet (20 mg total) by mouth daily. 07/31/23   Leala Prince, PA-C  SPIKEVAX syringe Inject 0.5 mLs into the muscle once. 11/04/22   [provider]  spironolactone  (ALDACTONE ) 25 MG tablet Take 1 tablet (25 mg total) by mouth daily. 03/27/23   [name], [name], PA-C  timolol (TIMOPTIC) 0.5 % ophthalmic solution Place 1 drop into the left eye 2 (two) times daily. 12/29/22   [provider]      Allergies    Propoxyphene n-acetaminophen     Review of Systems   Review of Systems  Constitutional:  Negative for chills, diaphoresis, fatigue and fever.  HENT:  Negative for congestion, rhinorrhea and sneezing.   Eyes: Negative.   Respiratory:  Negative for  cough, chest tightness and shortness of breath.   Cardiovascular:  Negative for chest pain and leg swelling.  Gastrointestinal:  Positive for abdominal pain, nausea and vomiting. Negative for blood in stool and diarrhea.  Genitourinary:  Negative for difficulty urinating, flank pain, frequency and hematuria.  Musculoskeletal:  Negative for arthralgias and back pain.  Skin:  Negative for rash.  Neurological:  Negative for dizziness, speech difficulty, weakness, numbness and headaches.    Physical Exam Updated Vital Signs BP (!) 147/52   Pulse (!) 45   Temp 97.7 F (36.5 C) (Oral)   Resp 17   SpO2 95%  Physical Exam Constitutional:      Appearance: She is well-developed.  HENT:     Head: Normocephalic and atraumatic.  Eyes:     Pupils: Pupils are equal, round, and reactive to light.  Cardiovascular:     Rate and Rhythm: Normal rate and regular rhythm.     Heart sounds: Normal heart sounds.  Pulmonary:     Effort: Pulmonary effort is normal. No respiratory distress.     Breath sounds: Normal breath sounds. No wheezing or rales.  Chest:     Chest wall: No tenderness.  Abdominal:     General: Bowel sounds are normal.     Palpations: Abdomen is soft.     Tenderness: There is abdominal tenderness in the right lower quadrant, suprapubic area and left lower quadrant. There is no guarding or rebound.  Musculoskeletal:        General: Normal range of motion.     Cervical back: Normal range of motion and neck supple.  Lymphadenopathy:     Cervical: No cervical adenopathy.  Skin:    General: Skin is warm and dry.     Findings: No rash.  Neurological:     Mental Status: She is alert and oriented to person, place, and time.     ED Results / Procedures / Treatments   Labs (all labs ordered are listed, but only abnormal results are displayed) Labs Reviewed  COMPREHENSIVE METABOLIC PANEL WITH GFR - Abnormal; Notable for the following components:      Result Value   Glucose, Bld  137 (*)    BUN 31 (*)    Creatinine, Ser 1.60 (*)    GFR, Estimated 33 (*)    All other components within normal limits  CBC - Abnormal; Notable for the following components:   WBC 22.6 (*)    All other components within normal limits  URINALYSIS, ROUTINE W REFLEX MICROSCOPIC - Abnormal; Notable for the following components:   APPearance HAZY (*)    Hgb urine dipstick TRACE (*)    Bilirubin Urine SMALL (*)    Ketones, ur TRACE (*)    Protein, ur 100 (*)  Leukocytes,Ua MODERATE (*)    All other components within normal limits  CULTURE, BLOOD (ROUTINE X 2)  CULTURE, BLOOD (ROUTINE X 2)  URINE CULTURE  LIPASE, BLOOD  LACTIC ACID, PLASMA    EKG EKG Interpretation Date/Time:  Saturday August 05 2023 22:26:42 EDT Ventricular Rate:  44 PR Interval:  176 QRS Duration:  94 QT Interval:  501 QTC Calculation: 429 R Axis:   1  Text Interpretation: Sinus bradycardia Low voltage, precordial leads Abnormal R-wave progression, early transition Nonspecific T abnrm, anterolateral leads since last tracing no significant change Confirmed by Hershel Los [phone]) on 08/05/2023 10:41:06 PM  Radiology CT Renal Stone Study Result Date: 08/05/2023 CLINICAL DATA:  Flank pain, initial encounter EXAM: CT ABDOMEN AND PELVIS WITHOUT CONTRAST TECHNIQUE: Multidetector CT imaging of the abdomen and pelvis was performed following the standard protocol without IV contrast. RADIATION DOSE REDUCTION: This exam was performed according to the departmental dose-optimization program which includes automated exposure control, adjustment of the mA and/or kV according to patient size and/or use of iterative reconstruction technique. COMPARISON:  12/26/2014 FINDINGS: Lower chest: Lung bases demonstrate a calcified granuloma in the left lower lobe laterally stable from the prior exam. Hepatobiliary: No focal liver abnormality is seen. No gallstones, gallbladder wall thickening, or biliary dilatation. Pancreas: Unremarkable. No  pancreatic ductal dilatation or surrounding inflammatory changes. Spleen: Normal in size without focal abnormality. Adrenals/Urinary Tract: Adrenal glands are within normal limits. Kidneys are well visualized bilaterally without renal calculi or obstructive change. The bladder is decompressed. Stomach/Bowel: Significant diverticular changes noted within the sigmoid colon. No diverticulitis is noted. Mild retained fecal material is seen without obstructive change. The appendix is within normal limits. Small bowel and stomach are unremarkable. Vascular/Lymphatic: Aortic atherosclerosis. No enlarged abdominal or pelvic lymph nodes. Reproductive: Uterus and bilateral adnexa are unremarkable. Other: No abdominal wall hernia or abnormality. No abdominopelvic ascites. Musculoskeletal: No acute or significant osseous findings. IMPRESSION: Diverticulosis without diverticulitis. No other focal abnormality is noted. Electronically Signed   By: Violeta Grey M.D.   On: 08/05/2023 22:26    Procedures Procedures    Medications Ordered in ED Medications  cefTRIAXone (ROCEPHIN) 1 g in sodium chloride  0.9 % 100 mL IVPB (1 g Intravenous New Bag/Given 08/05/23 2300)  ondansetron  (ZOFRAN ) injection 4 mg (4 mg Intravenous Given 08/05/23 2257)  sodium chloride  0.9 % bolus 500 mL (500 mLs Intravenous New Bag/Given 08/05/23 2257)    ED Course/ Medical Decision Making/ A&P                                 Medical Decision Making Amount and/or Complexity of Data Reviewed Labs: ordered. Radiology: ordered.  Risk Prescription drug management.   This patient presents to the ED for concern of suprapubic pain, this involves an extensive number of treatment options, and is a complaint that carries with it a high risk of complications and morbidity.  I considered the following differential and admission for this acute, potentially life threatening condition.  The differential diagnosis includes urinary tract infection, kidney  stone, diverticulitis, bowel obstruction, colitis, pyelonephritis  MDM:    Patient presents with lower abdominal pain.  She is not running any fevers.  She had an episode of vomiting today but no recent vomiting.  She has had some nausea and decreased appetite.  She has had some dark urine and some left-sided flank pain.  Her urine does appear to be infected.  It was  sent for culture.  Her white count is elevated at 20,000.  However her lactate is normal.  Blood cultures were drawn.  Her blood pressure has been stable.  She has bradycardic but I reviewed her chart and her last cardiology notes show her heart rate at 44.  CT scan was performed which does not show any evidence of acute abnormality.  No kidney stone.  No evidence of pyelonephritis.  No diverticulitis.  She was given a dose of IV Rocephin and IV fluids.  Her creatinine is mildly elevated as compared to prior values.  Discussed option of admission versus going home.  At this point, patient wants to go home.  Will give her p.o. trial first to make sure that she does not have any ongoing vomiting.  If she does okay with this, we will discharge her home with prescription for Keflex  and Zofran .  Encouraged her to have close follow-up with her PCP.  Advised her she will need to have her creatinine rechecked.  Strict return precautions were given.  (Labs, imaging, consults)  Labs: I Ordered, and personally interpreted labs.  The pertinent results include: Elevated WBC count, elevated creatinine  Imaging Studies ordered: I ordered imaging studies including CT scan of abdomen pelvis I independently visualized and interpreted imaging. I agree with the radiologist interpretation  Additional history obtained from chart.  External records from outside source obtained and reviewed including prior notes  Cardiac Monitoring: The patient was maintained on a cardiac monitor.  If on the cardiac monitor, I personally viewed and interpreted the cardiac  monitored which showed an underlying rhythm of: Sinus bradycardia  Reevaluation: After the interventions noted above, I reevaluated the patient and found that they have :improved  Social Determinants of Health:  none  Disposition: Discharged to home  Co morbidities that complicate the patient evaluation  Past Medical History:  Diagnosis Date   Allergic rhinitis    Arthritis    Coronary artery disease    cath 01/12/2015 single vessel CAD, DES to mid LAD   GERD (gastroesophageal reflux disease)    Headache    History of chicken pox    Hyperlipidemia    Hypertension    Hyperthyroidism    Syncope      Medicines Meds ordered this encounter  Medications   cefTRIAXone (ROCEPHIN) 1 g in sodium chloride  0.9 % 100 mL IVPB    Antibiotic Indication::   UTI   ondansetron  (ZOFRAN ) injection 4 mg   sodium chloride  0.9 % bolus 500 mL   cephALEXin  (KEFLEX ) 500 MG capsule    Sig: Take 1 capsule (500 mg total) by mouth 2 (two) times daily.    Dispense:  14 capsule    Refill:  0   ondansetron  (ZOFRAN -ODT) 4 MG disintegrating tablet    Sig: 4mg  ODT q4 hours prn nausea/vomit    Dispense:  8 tablet    Refill:  0    I have reviewed the patients home medicines and have made adjustments as needed  Problem List / ED Course: Problem List Items Addressed This Visit   None Visit Diagnoses       Acute cystitis without hematuria    -  Primary             {Final Clinical Impression(s) / ED Diagnoses Final diagnoses:  Acute cystitis without hematuria    Rx / DC Orders ED Discharge Orders          Ordered    cephALEXin  (KEFLEX ) 500 MG capsule  2 times daily        08/05/23 2302              Hershel Los, MD 08/05/23 2310

## 2023-08-05 NOTE — ED Notes (Signed)
 Patient reports bradycardia at baseline.

## 2023-08-10 LAB — CULTURE, BLOOD (ROUTINE X 2)
Culture: NO GROWTH
Culture: NO GROWTH

## 2023-08-11 ENCOUNTER — Ambulatory Visit (INDEPENDENT_AMBULATORY_CARE_PROVIDER_SITE_OTHER): Admitting: Family Medicine

## 2023-08-11 VITALS — BP 152/68 | HR 45 | Temp 97.8°F | Ht 65.0 in | Wt 168.8 lb

## 2023-08-11 DIAGNOSIS — R7989 Other specified abnormal findings of blood chemistry: Secondary | ICD-10-CM

## 2023-08-11 DIAGNOSIS — I10 Essential (primary) hypertension: Secondary | ICD-10-CM

## 2023-08-11 DIAGNOSIS — D72829 Elevated white blood cell count, unspecified: Secondary | ICD-10-CM

## 2023-08-11 DIAGNOSIS — N3000 Acute cystitis without hematuria: Secondary | ICD-10-CM

## 2023-08-11 LAB — CBC WITH DIFFERENTIAL/PLATELET
Basophils Absolute: 0 10*3/uL (ref 0.0–0.1)
Basophils Relative: 0.6 % (ref 0.0–3.0)
Eosinophils Absolute: 0.2 10*3/uL (ref 0.0–0.7)
Eosinophils Relative: 2.6 % (ref 0.0–5.0)
HCT: 38.5 % (ref 36.0–46.0)
Hemoglobin: 12.9 g/dL (ref 12.0–15.0)
Lymphocytes Relative: 37.3 % (ref 12.0–46.0)
Lymphs Abs: 2.9 10*3/uL (ref 0.7–4.0)
MCHC: 33.5 g/dL (ref 30.0–36.0)
MCV: 88.2 fl (ref 78.0–100.0)
Monocytes Absolute: 0.7 10*3/uL (ref 0.1–1.0)
Monocytes Relative: 9.3 % (ref 3.0–12.0)
Neutro Abs: 3.9 10*3/uL (ref 1.4–7.7)
Neutrophils Relative %: 50.2 % (ref 43.0–77.0)
Platelets: 364 10*3/uL (ref 150.0–400.0)
RBC: 4.36 Mil/uL (ref 3.87–5.11)
RDW: 13.5 % (ref 11.5–15.5)
WBC: 7.7 10*3/uL (ref 4.0–10.5)

## 2023-08-11 LAB — BASIC METABOLIC PANEL WITH GFR
BUN: 30 mg/dL — ABNORMAL HIGH (ref 6–23)
CO2: 28 meq/L (ref 19–32)
Calcium: 10.4 mg/dL (ref 8.4–10.5)
Chloride: 101 meq/L (ref 96–112)
Creatinine, Ser: 1.14 mg/dL (ref 0.40–1.20)
GFR: 46.05 mL/min — ABNORMAL LOW
Glucose, Bld: 87 mg/dL (ref 70–99)
Potassium: 3.8 meq/L (ref 3.5–5.1)
Sodium: 139 meq/L (ref 135–145)

## 2023-08-11 NOTE — Progress Notes (Unsigned)
   Subjective:    Patient ID: Ann Wagner, female    DOB: [birth year], 78 y.o.   MRN: [id number]  HPI ER f/u- pt went to ER on 6/7 complaining of abd pain x1 week.  + N/V and chills.  WBC 22.6, UA concerning for infxn.  Cr elevated at 1.6  Was tx'd w/ Rocephin  1 gm, IVF.  Was dc'd w/ Keflex  and Zofran .  Needs to have repeat CBC and BMP  Pt reports feeling 'a lot better' since taking medication.  HTN- ongoing issue for pt.  Currently on Spironolactone  25mg  daily, Lisinopril  40mg  daily, Chlorthalidone  25mg  daily, Amlodipine  10mg  daily and BP is mildly elevated.  BP in ER 147/52   Review of Systems For ROS see HPI     Objective:   Physical Exam Constitutional:      General: She is not in acute distress.    Appearance: Normal appearance. She is well-developed. She is not ill-appearing.  HENT:     Head: Normocephalic and atraumatic.   Eyes:     Conjunctiva/sclera: Conjunctivae normal.     Pupils: Pupils are equal, round, and reactive to light.   Neck:     Thyroid : No thyromegaly.   Cardiovascular:     Rate and Rhythm: Normal rate and regular rhythm.     Heart sounds: Normal heart sounds. No murmur heard. Pulmonary:     Effort: Pulmonary effort is normal. No respiratory distress.     Breath sounds: Normal breath sounds.  Abdominal:     General: There is no distension.     Palpations: Abdomen is soft.     Tenderness: There is no abdominal tenderness.   Musculoskeletal:     Cervical Wagner: Normal range of motion and neck supple.  Lymphadenopathy:     Cervical: No cervical adenopathy.   Skin:    General: Skin is warm and dry.   Neurological:     General: No focal deficit present.     Mental Status: She is alert and oriented to person, place, and time.   Psychiatric:        Mood and Affect: Mood normal.        Behavior: Behavior normal.           Assessment & Plan:  Acute cystitis- new.  Reviewed ER notes.  Pt reports feeling better since taking abx.  Currently  asymptomatic.  Elevated Cr- new.  Likely due to decreased PO intake.  Repeat today to ensure level is Wagner in normal range.  Pt expressed understanding and is in agreement w/ plan.   Leukocytosis- new.  WBC was quite high in ER at 22.6  Repeat today to ensure WBC is trending Wagner down.

## 2023-08-11 NOTE — Patient Instructions (Addendum)
 Follow up in 1 month to recheck BP (or when you get back) We'll notify you of your lab results and make any changes if needed Continue to drink LOTS of water Call with any questions or concerns Stay Safe!  Stay Healthy! Hang in there!!!

## 2023-08-12 ENCOUNTER — Encounter: Payer: Self-pay | Admitting: Family Medicine

## 2023-08-12 NOTE — Assessment & Plan Note (Signed)
 Deteriorated.  BP is elevated today but it did come down somewhat before end of visit.  Will hold on adjusting BP meds at this time and will have her f/u in a few weeks to recheck.  Pt expressed understanding and is in agreement w/ plan.

## 2023-08-14 ENCOUNTER — Ambulatory Visit: Payer: Self-pay | Admitting: Family Medicine

## 2023-08-14 NOTE — Telephone Encounter (Signed)
-----   Message from Laymon Priest sent at 08/14/2023  7:47 AM EDT ----- Labs look good!  No changes at this time ----- Message ----- From: Interface, Lab In [facility] Sent: 08/11/2023   4:05 PM EDT To: Jess Morita, MD

## 2023-08-14 NOTE — Progress Notes (Signed)
 LVM to office about lab results

## 2023-08-15 NOTE — Progress Notes (Signed)
 Pot has reviewed labs via [employer]

## 2023-09-05 ENCOUNTER — Ambulatory Visit (INDEPENDENT_AMBULATORY_CARE_PROVIDER_SITE_OTHER): Admitting: *Deleted

## 2023-09-05 VITALS — Ht 65.0 in | Wt 168.0 lb

## 2023-09-05 DIAGNOSIS — Z0000 Encounter for general adult medical examination without abnormal findings: Secondary | ICD-10-CM

## 2023-09-05 NOTE — Patient Instructions (Signed)
 Ann Wagner , Thank you for taking time to come for your Medicare Wellness Visit. I appreciate your ongoing commitment to your health goals. Please review the following plan we discussed and let me know if I can assist you in the future.   Screening recommendations/referrals: Colonoscopy: no longer needed Mammogram: no longer needed Bone Density: up to date Recommended yearly ophthalmology/optometry visit for glaucoma screening and checkup Recommended yearly dental visit for hygiene and checkup  Vaccinations: Influenza vaccine: up to date Pneumococcal vaccine: up to date Tdap vaccine: up to date Shingles vaccine: Education provided     Preventive Care 65 Years and Older, Female Preventive care refers to lifestyle choices and visits with your health care provider that can promote health and wellness. What does preventive care include? A yearly physical exam. This is also called an annual well check. Dental exams once or twice a year. Routine eye exams. Ask your health care provider how often you should have your eyes checked. Personal lifestyle choices, including: Daily care of your teeth and gums. Regular physical activity. Eating a healthy diet. Avoiding tobacco and drug use. Limiting alcohol use. Practicing safe sex. Taking low-dose aspirin  every day. Taking vitamin and mineral supplements as recommended by your health care provider. What happens during an annual well check? The services and screenings done by your health care provider during your annual well check will depend on your age, overall health, lifestyle risk factors, and family history of disease. Counseling  Your health care provider may ask you questions about your: Alcohol use. Tobacco use. Drug use. Emotional well-being. Home and relationship well-being. Sexual activity. Eating habits. History of falls. Memory and ability to understand (cognition). Work and work Astronomer. Reproductive  health. Screening  You may have the following tests or measurements: Height, weight, and BMI. Blood pressure. Lipid and cholesterol levels. These may be checked every 5 years, or more frequently if you are over 55 years old. Skin check. Lung cancer screening. You may have this screening every year starting at age 35 if you have a 30-pack-year history of smoking and currently smoke or have quit within the past 15 years. Fecal occult blood test (FOBT) of the stool. You may have this test every year starting at age 66. Flexible sigmoidoscopy or colonoscopy. You may have a sigmoidoscopy every 5 years or a colonoscopy every 10 years starting at age 36. Hepatitis C blood test. Hepatitis B blood test. Sexually transmitted disease (STD) testing. Diabetes screening. This is done by checking your blood sugar (glucose) after you have not eaten for a while (fasting). You may have this done every 1-3 years. Bone density scan. This is done to screen for osteoporosis. You may have this done starting at age 23. Mammogram. This may be done every 1-2 years. Talk to your health care provider about how often you should have regular mammograms. Talk with your health care provider about your test results, treatment options, and if necessary, the need for more tests. Vaccines  Your health care provider may recommend certain vaccines, such as: Influenza vaccine. This is recommended every year. Tetanus, diphtheria, and acellular pertussis (Tdap, Td) vaccine. You may need a Td booster every 10 years. Zoster vaccine. You may need this after age 41. Pneumococcal 13-valent conjugate (PCV13) vaccine. One dose is recommended after age 67. Pneumococcal polysaccharide (PPSV23) vaccine. One dose is recommended after age 31. Talk to your health care provider about which screenings and vaccines you need and how often you need them. This information is  not intended to replace advice given to you by your health care provider.  Make sure you discuss any questions you have with your health care provider. Document Released: 03/13/2015 Document Revised: 11/04/2015 Document Reviewed: 12/16/2014 Elsevier Interactive Patient Education  2017 [employer].  Fall Prevention in the Home Falls can cause injuries. They can happen to people of all ages. There are many things you can do to make your home safe and to help prevent falls. What can I do on the outside of my home? Regularly fix the edges of walkways and driveways and fix any cracks. Remove anything that might make you trip as you walk through a door, such as a raised step or threshold. Trim any bushes or trees on the path to your home. Use bright outdoor lighting. Clear any walking paths of anything that might make someone trip, such as rocks or tools. Regularly check to see if handrails are loose or broken. Make sure that both sides of any steps have handrails. Any raised decks and porches should have guardrails on the edges. Have any leaves, snow, or ice cleared regularly. Use sand or salt on walking paths during winter. Clean up any spills in your garage right away. This includes oil or grease spills. What can I do in the bathroom? Use night lights. Install grab bars by the toilet and in the tub and shower. Do not use towel bars as grab bars. Use non-skid mats or decals in the tub or shower. If you need to sit down in the shower, use a plastic, non-slip stool. Keep the floor dry. Clean up any water that spills on the floor as soon as it happens. Remove soap buildup in the tub or shower regularly. Attach bath mats securely with double-sided non-slip rug tape. Do not have throw rugs and other things on the floor that can make you trip. What can I do in the bedroom? Use night lights. Make sure that you have a light by your bed that is easy to reach. Do not use any sheets or blankets that are too big for your bed. They should not hang down onto the floor. Have a  firm chair that has side arms. You can use this for support while you get dressed. Do not have throw rugs and other things on the floor that can make you trip. What can I do in the kitchen? Clean up any spills right away. Avoid walking on wet floors. Keep items that you use a lot in easy-to-reach places. If you need to reach something above you, use a strong step stool that has a grab bar. Keep electrical cords out of the way. Do not use floor polish or wax that makes floors slippery. If you must use wax, use non-skid floor wax. Do not have throw rugs and other things on the floor that can make you trip. What can I do with my stairs? Do not leave any items on the stairs. Make sure that there are handrails on both sides of the stairs and use them. Fix handrails that are broken or loose. Make sure that handrails are as long as the stairways. Check any carpeting to make sure that it is firmly attached to the stairs. Fix any carpet that is loose or worn. Avoid having throw rugs at the top or bottom of the stairs. If you do have throw rugs, attach them to the floor with carpet tape. Make sure that you have a light switch at the top of the  stairs and the bottom of the stairs. If you do not have them, ask someone to add them for you. What else can I do to help prevent falls? Wear shoes that: Do not have high heels. Have rubber bottoms. Are comfortable and fit you well. Are closed at the toe. Do not wear sandals. If you use a stepladder: Make sure that it is fully opened. Do not climb a closed stepladder. Make sure that both sides of the stepladder are locked into place. Ask someone to hold it for you, if possible. Clearly mark and make sure that you can see: Any grab bars or handrails. First and last steps. Where the edge of each step is. Use tools that help you move around (mobility aids) if they are needed. These include: Canes. Walkers. Scooters. Crutches. Turn on the lights when you  go into a dark area. Replace any light bulbs as soon as they burn out. Set up your furniture so you have a clear path. Avoid moving your furniture around. If any of your floors are uneven, fix them. If there are any pets around you, be aware of where they are. Review your medicines with your doctor. Some medicines can make you feel dizzy. This can increase your chance of falling. Ask your doctor what other things that you can do to help prevent falls. This information is not intended to replace advice given to you by your health care provider. Make sure you discuss any questions you have with your health care provider. Document Released: 12/11/2008 Document Revised: 07/23/2015 Document Reviewed: 03/21/2014 Elsevier Interactive Patient Education  2017 [employer].

## 2023-09-05 NOTE — Progress Notes (Signed)
 Subjective:   Ann Wagner is a 78 y.o. female who presents for Medicare Annual (Subsequent) preventive examination.  Visit Complete: Virtual I connected with  Ann Wagner on 09/05/23 by a audio enabled telemedicine application and verified that I am speaking with the correct person using two identifiers.  Patient Location: Home  Provider Location: Home Office  I discussed the limitations of evaluation and management by telemedicine. The patient expressed understanding and agreed to proceed.  Vital Signs: Because this visit was a virtual/telehealth visit, some criteria may be missing or patient reported. Any vitals not documented were not able to be obtained and vitals that have been documented are patient reported.  Patient Medicare AWV questionnaire was completed by the patient on 09-02-2023; I have confirmed that all information answered by patient is correct and no changes since this date.  Cardiac Risk Factors include: advanced age (>57men, >6 women);obesity (BMI >30kg/m2);hypertension     Objective:    Today's Vitals   09/05/23 1018  Weight: 168 lb (76.2 kg)  Height: 5' 5 (1.651 m)   Body mass index is 27.96 kg/m.     09/05/2023   10:16 AM 08/05/2023    8:54 PM 08/17/2022    1:16 PM 09/22/2021    2:47 PM 06/26/2019    9:52 AM 04/18/2018   10:29 AM 04/05/2017    2:08 PM  Advanced Directives  Does Patient Have a Medical Advance Directive? Yes No Yes Yes Yes Yes  Yes   Type of Forensic scientist of [employer] Power of [place];Living will Living will;Healthcare Power of [employer] Power of [place];Living will Living will;Healthcare Power of Attorney  Does patient want to make changes to medical advance directive?     No - Patient declined    Copy of Healthcare Power of Attorney in Chart? No - copy requested  No - copy requested No - copy requested No - copy requested No - copy requested  No - copy requested    Would patient like information on creating a medical advance directive?  No - Patient declined          Data saved with a previous flowsheet row definition    Current Medications (verified) Outpatient Encounter Medications as of 09/05/2023  Medication Sig   albuterol  (PROVENTIL  HFA;VENTOLIN  HFA) 108 (90 Base) MCG/ACT inhaler Inhale 2 puffs into the lungs every 6 (six) hours as needed for wheezing or shortness of breath.   amLODipine  (NORVASC ) 10 MG tablet TAKE 1 TABLET(10 MG) BY MOUTH DAILY   amoxicillin  (AMOXIL ) 875 MG tablet Take 875 mg by mouth 2 (two) times daily.   Apoaequorin (PREVAGEN EXTRA STRENGTH) 20 MG CAPS Take 20 mg by mouth daily.   aspirin  EC 81 MG tablet Take 1 tablet (81 mg total) by mouth daily.   chlorthalidone  (HYGROTON ) 25 MG tablet TAKE 1 TABLET(25 MG) BY MOUTH DAILY   dorzolamide-timolol (COSOPT) 2-0.5 % ophthalmic solution Place 1 drop into the left eye 2 (two) times daily.   fenofibrate  160 MG tablet Take 1 tablet (160 mg total) by mouth daily.   FLUARIX 0.5 ML injection Inject 0.5 mLs into the muscle once.   fluticasone  (FLONASE ) 50 MCG/ACT nasal spray SHAKE LIQUID AND USE 2 SPRAYS IN EACH NOSTRIL DAILY   isosorbide  mononitrate (IMDUR ) 30 MG 24 hr tablet Take 1 tablet (30 mg total) by mouth daily.   levothyroxine  (SYNTHROID ) 88 MCG tablet TAKE 1 TABLET(88 MCG) BY MOUTH DAILY  lisinopril  (ZESTRIL ) 40 MG tablet TAKE 1 TABLET(40 MG) BY MOUTH DAILY   loratadine (CLARITIN) 10 MG tablet Take 10 mg by mouth daily.   methocarbamol  (ROBAXIN ) 500 MG tablet Take 1 tablet (500 mg total) by mouth every 8 (eight) hours as needed for muscle spasms.   nitroGLYCERIN  (NITROSTAT ) 0.4 MG SL tablet PLACE 1 TABLET UNDER TONGUE EVERY 5 MINUTES AS NEEDED FOR CHEST PAIN   ondansetron  (ZOFRAN -ODT) 4 MG disintegrating tablet 4mg  ODT q4 hours prn nausea/vomit   pantoprazole  (PROTONIX ) 40 MG tablet TAKE 1 TABLET BY MOUTH DAILY AT NOON   PARoxetine  (PAXIL ) 10 MG tablet TAKE 1 TABLET(10 MG) BY  MOUTH DAILY   potassium chloride (K-DUR) 10 MEQ tablet Take 10 mEq by mouth once.   rosuvastatin  (CRESTOR ) 20 MG tablet Take 1 tablet (20 mg total) by mouth daily.   SPIKEVAX syringe Inject 0.5 mLs into the muscle once.   spironolactone  (ALDACTONE ) 25 MG tablet Take 1 tablet (25 mg total) by mouth daily.   timolol (TIMOPTIC) 0.5 % ophthalmic solution Place 1 drop into the left eye 2 (two) times daily.   cephALEXin  (KEFLEX ) 500 MG capsule Take 1 capsule (500 mg total) by mouth 2 (two) times daily.   No facility-administered encounter medications on file as of 09/05/2023.    Allergies (verified) Propoxyphene n-acetaminophen    History: Past Medical History:  Diagnosis Date   Allergic rhinitis    Arthritis    Coronary artery disease    cath 01/12/2015 single vessel CAD, DES to mid LAD   GERD (gastroesophageal reflux disease)    Headache    History of chicken pox    Hyperlipidemia    Hypertension    Hyperthyroidism    Syncope    Past Surgical History:  Procedure Laterality Date   CARDIAC CATHETERIZATION N/A 01/12/2015   Procedure: Left Heart Cath and Coronary Angiography;  Surgeon: Peter M Swaziland, MD;  Location: [facility] INVASIVE CV LAB;  Service: Cardiovascular;  Laterality: N/A;   CARDIAC CATHETERIZATION Right 01/12/2015   Procedure: Coronary Stent Intervention;  Surgeon: Peter M Swaziland, MD;  Location: [facility] INVASIVE CV LAB;  Service: Cardiovascular;  Laterality: Right;   CORONARY STENT PLACEMENT  01/12/2015   des to lad   TUBAL LIGATION     Family History  Problem Relation Age of Onset   Coronary artery disease Mother    Hypertension Mother    Heart attack Mother    Diabetes Mother    Coronary artery disease Father    Hypertension Father    Coronary artery disease Paternal Grandmother    Hypertension Paternal Grandmother    Heart disease Son    Hypertension Son    Stroke Neg Hx    Cancer Neg Hx    Social History   Socioeconomic History   Marital status: Divorced    Spouse  name: Not on file   Number of children: Not on file   Years of education: Not on file   Highest education level: Associate degree: occupational, Scientist, product/process development, or vocational program  Occupational History   Not on file  Tobacco Use   Smoking status: Former   Smokeless tobacco: Never  Vaping Use   Vaping status: Never Used  Substance and Sexual Activity   Alcohol use: Yes    Comment: socially   Drug use: No   Sexual activity: Not Currently  Other Topics Concern   Not on file  Social History Narrative   Lives alone.   2 sons - 1 locally with 5  kids.   Retired Engineer, agricultural, enjoys antique shows.   Social Drivers of Corporate investment banker Strain: Low Risk  (09/05/2023)   Overall Financial Resource Strain (CARDIA)    Difficulty of Paying Living Expenses: Not hard at all  Food Insecurity: No Food Insecurity (09/05/2023)   Hunger Vital Sign    Worried About Running Out of Food in the Last Year: Never true    Ran Out of Food in the Last Year: Never true  Transportation Needs: No Transportation Needs (09/05/2023)   PRAPARE - Administrator, Civil Service (Medical): No    Lack of Transportation (Non-Medical): No  Physical Activity: Insufficiently Active (09/05/2023)   Exercise Vital Sign    Days of Exercise per Week: 3 days    Minutes of Exercise per Session: 40 min  Stress: No Stress Concern Present (09/05/2023)   [employer] of Occupational Health - Occupational Stress Questionnaire    Feeling of Stress: Not at all  Social Connections: Moderately Isolated (09/05/2023)   Social Connection and Isolation Panel    Frequency of Communication with Friends and Family: Once a week    Frequency of Social Gatherings with Friends and Family: Once a week    Attends Religious Services: More than 4 times per year    Active Member of [employer] or Organizations: Yes    Attends Banker Meetings: 1 to 4 times per year    Marital Status: Divorced    Tobacco  Counseling Counseling given: Not Answered   Clinical Intake:  Pre-visit preparation completed: Yes  Pain : No/denies pain     Diabetes: No  How often do you need to have someone help you when you read instructions, pamphlets, or other written materials from your doctor or pharmacy?: 1 - Never  Interpreter Needed?: No  Information entered by :: Mliss Graff LPN   Activities of Daily Living    09/05/2023   10:27 AM 09/02/2023    4:35 PM  In your present state of health, do you have any difficulty performing the following activities:  Hearing? 0 0  Vision? 0 0  Difficulty concentrating or making decisions? 0 0  Walking or climbing stairs? 0 0  Dressing or bathing? 0 0  Doing errands, shopping? 0 0  Preparing Food and eating ? N N  Using the Toilet? N N  In the past six months, have you accidently leaked urine? N N  Do you have problems with loss of bowel control? N N  Managing your Medications? N N  Managing your Finances? N N  Housekeeping or managing your Housekeeping? N N    Patient Care Team: Mahlon Comer BRAVO, MD as PCP - General [place], Powell BRAVO, MD (Inactive) as PCP - Cardiology (Cardiology) [name], MD as Consulting Physician (Cardiology) Pandora Cadet, [facility] as Pharmacist (Pharmacist) Cleotilde Sewer, OD as Consulting Physician (Optometry)  Indicate any recent Medical Services you may have received from other than [facility] providers in the past year (date may be approximate).     Assessment:   This is a routine wellness examination for [place].  Hearing/Vision screen Hearing Screening - Comments:: No trouble hearing Vision Screening - Comments:: [facility] Up to date   Goals Addressed             This Visit's Progress    BP <130/90   On track    CARE PLAN ENTRY (see longitudinal plan of care for additional care plan information)  Current Barriers:  Current antihypertensive regimen:  Amlodipine  10 mg daily  HCTZ 25 mg daily Lisinopril  40  mg daily  Potassium 10 meq daily Last practice recorded BP readings:  BP Readings from Last 3 Encounters:  03/26/19 136/83  12/04/18 (!) 142/68  09/20/18 128/78   Pharmacist Clinical Goal(s):  Over the next 180 days, patient will work with PharmD and providers to optimize antihypertensive regimen.  Interventions: Inter-disciplinary care team collaboration (see longitudinal plan of care). Comprehensive medication review performed; medication list updated in the electronic medical record.   Patient Self Care Activities:  Consider weekly BP monitoring at home - document, and provide at future appointments. Patient will focus on medication adherence by continuing current medication management.   Initial goal documentation.      GERD: Minimize recurring symptoms   On track    CARE PLAN ENTRY  Current Barriers:  Chronic Disease Management support, education, and care coordination needs related to Gastroesophageal Reflux Disease.  Pharmacist Clinical Goal(s):  Over next 180 days, minimize recurring symptoms of acid reflux.   Interventions: Comprehensive medication review performed.  Patient Self Care Activities:  Continue Protonix  and continue to avoid triggers such as citrus juices and tomato sauce.  Initial goal documentation.      LDL <70   On track    CARE PLAN ENTRY (see longitudinal plan of care for additional care plan information)  Current Barriers:  Current antihyperlipidemic regimen: fenofibrate  160 mg daily, rosuvastatin  20 mg  Most recent lipid panel:     Component Value Date/Time   CHOL 133 03/05/2019 0809   TRIG 105 03/05/2019 0809   HDL 47 03/05/2019 0809   CHOLHDL 2.8 03/05/2019 0809   CHOLHDL 3 09/20/2018 1208   VLDL 25.0 09/20/2018 1208   LDLCALC 67 03/05/2019 0809   LDLDIRECT 92.9 12/20/2011 0924   ASCVD risk enhancing conditions: age >11, HTN  Pharmacist Clinical Goal(s):  Over the next 180 days, patient will work with PharmD and providers  towards optimized antihyperlipidemic therapy  Interventions: Comprehensive medication review performed; medication list updated in electronic medical record.  Inter-disciplinary care team collaboration (see longitudinal plan of care)  Patient Self Care Activities:  Patient will focus on medication adherence by continuing current medication management  Initial goal documentation     Patient Stated   On track    Stay healthy Continue current lifestyle     Patient Stated       Continue current lifestyle       Depression Screen    09/05/2023   10:20 AM 08/17/2022    1:19 PM 11/12/2021   12:52 PM 09/22/2021    2:47 PM 09/22/2021    2:41 PM 07/07/2021   11:04 AM 08/04/2020    7:35 AM  PHQ 2/9 Scores  PHQ - 2 Score 0 0 0 0 0 0 0  PHQ- 9 Score 2 0 0   0 0    Fall Risk    09/05/2023   10:15 AM 09/02/2023    4:35 PM 08/17/2022    1:14 PM 08/15/2022    5:45 PM 11/12/2021   12:52 PM  Fall Risk   Falls in the past year? 0 0 0 0 1  Number falls in past yr: 0 0 0 0 0  Injury with Fall? 0 0 0 0 0  Follow up Falls evaluation completed;Education provided;Falls prevention discussed  Falls evaluation completed;Education provided;Falls prevention discussed      MEDICARE RISK AT HOME: Medicare Risk at Home Any stairs in or around  the home?: Yes If so, are there any without handrails?: No Home free of loose throw rugs in walkways, pet beds, electrical cords, etc?: Yes Adequate lighting in your home to reduce risk of falls?: Yes Life alert?: No Use of a cane, walker or w/c?: No Grab bars in the bathroom?: No Shower chair or bench in shower?: No Elevated toilet seat or a handicapped toilet?: No  TIMED UP AND GO:  Was the test performed?  No    Cognitive Function:    04/18/2018   10:30 AM  MMSE - Mini Mental State Exam  Orientation to time 5  Orientation to Place 5  Registration 3  Attention/ Calculation 5  Recall 2  Language- name 2 objects 2  Language- repeat 1  Language- follow  3 step command 3  Language- read & follow direction 1  Write a sentence 1  Copy design 1  Total score 29        09/05/2023   10:17 AM 08/17/2022    1:21 PM 06/26/2019   11:02 AM  6CIT Screen  What Year? 0 points 0 points 0 points  What month? 0 points 0 points 0 points  What time? 0 points 0 points 0 points  Count back from 20 0 points 0 points 0 points  Months in reverse 0 points 0 points 0 points  Repeat phrase 0 points 0 points 0 points  Total Score 0 points 0 points 0 points    Immunizations Immunization History  Administered Date(s) Administered   Influenza, High Dose Seasonal PF 11/20/2017, 11/09/2018   Influenza,inj,Quad PF,6+ Mos 11/29/2014   Influenza-Unspecified 12/29/2012, 10/29/2013, 12/17/2020   PFIZER(Purple Top)SARS-COV-2 Vaccination 04/10/2019, 05/01/2019, 12/17/2019   Pneumococcal Conjugate-13 04/30/2014   Pneumococcal Polysaccharide-23 04/05/2017   Td 08/23/2016   Zoster, Live 02/28/2010    TDAP status: Up to date  Flu Vaccine status: Up to date  Pneumococcal vaccine status: Up to date  Covid-19 vaccine status: Information provided on how to obtain vaccines.   Qualifies for Shingles Vaccine? Yes   Zostavax completed No   Shingrix Completed?: Yes  Screening Tests Health Maintenance  Topic Date Due   INFLUENZA VACCINE  09/29/2023   Medicare Annual Wellness (AWV)  09/04/2024   DTaP/Tdap/Td (2 - Tdap) 08/24/2026   Pneumococcal Vaccine: 50+ Years  Completed   DEXA SCAN  Completed   Hepatitis C Screening  Completed   Hepatitis B Vaccines  Aged Out   HPV VACCINES  Aged Out   Meningococcal B Vaccine  Aged Out   Colonoscopy  Discontinued   COVID-19 Vaccine  Discontinued   Zoster Vaccines- Shingrix  Discontinued    Health Maintenance  There are no preventive care reminders to display for this patient.   Colorectal cancer screening: No longer required.   Mammogram status: No longer required due to age.  Bone Density status: Completed 2019.  Results reflect: Bone density results: NORMAL. Repeat every 0 years.  Lung Cancer Screening: (Low Dose CT Chest recommended if Age 50-80 years, 20 pack-year currently smoking OR have quit w/in 15years.) does not qualify.   Lung Cancer Screening Referral:   Additional Screening:  Hepatitis C Screening: does not qualify; Completed 2022  Vision Screening: Recommended annual ophthalmology exams for early detection of glaucoma and other disorders of the eye. Is the patient up to date with their annual eye exam?  Yes  Who is the provider or what is the name of the office in which the patient attends annual eye exams? [facility] If  pt is not established with a provider, would they like to be referred to a provider to establish care? No .   Dental Screening: Recommended annual dental exams for proper oral hygiene    Community Resource Referral / Chronic Care Management: CRR required this visit?  No   CCM required this visit?  No     Plan:     I have personally reviewed and noted the following in the patient's chart:   Medical and social history Use of alcohol, tobacco or illicit drugs  Current medications and supplements including opioid prescriptions. Patient is not currently taking opioid prescriptions. Functional ability and status Nutritional status Physical activity Advanced directives List of other physicians Hospitalizations, surgeries, and ER visits in previous 12 months Vitals Screenings to include cognitive, depression, and falls Referrals and appointments  In addition, I have reviewed and discussed with patient certain preventive protocols, quality metrics, and best practice recommendations. A written personalized care plan for preventive services as well as general preventive health recommendations were provided to patient.     Mliss Graff, LPN   03/31/7972   After Visit Summary: (MyChart) Due to this being a telephonic visit, the after visit summary with patients  personalized plan was offered to patient via MyChart   Nurse Notes:

## 2023-09-14 ENCOUNTER — Other Ambulatory Visit: Payer: Self-pay | Admitting: Family Medicine

## 2023-09-14 DIAGNOSIS — K219 Gastro-esophageal reflux disease without esophagitis: Secondary | ICD-10-CM

## 2023-09-14 MED ORDER — PANTOPRAZOLE SODIUM 40 MG PO TBEC: DELAYED_RELEASE_TABLET | ORAL | 0 refills | Status: DC

## 2023-09-14 NOTE — Telephone Encounter (Signed)
 Called patient and left vm to make appt with Dr. Mahlon. Sending in 30 day supply until then.

## 2023-09-14 NOTE — Telephone Encounter (Signed)
 Copied from CRM [id number]. Topic: Clinical - Medication Refill >> Sep 14, 2023 11:24 AM Ann Wagner wrote: Medication: pantoprazole  (PROTONIX ) 40 MG tablet [[id number]]  Has the patient contacted their pharmacy? Yes, the pharmacy is calling to place the request.  (Agent: If no, request that the patient contact the pharmacy for the refill. If patient does not wish to contact the pharmacy document the reason why and proceed with request.) (Agent: If yes, when and what did the pharmacy advise?)  This is the patient's preferred pharmacy:  [facility] [address] [place], [place] [address] Phone: [phone]  Is this the correct pharmacy for this prescription? Yes If no, delete pharmacy and type the correct one.   Has the prescription been filled recently? No  Is the patient out of the medication? Yes  Has the patient been seen for an appointment in the last year OR does the patient have an upcoming appointment? Yes  Can we respond through MyChart? Yes  Agent: Please be advised that Rx refills may take up to 3 business days. We ask that you follow-up with your pharmacy.

## 2023-09-22 ENCOUNTER — Other Ambulatory Visit: Payer: Self-pay | Admitting: Family Medicine

## 2023-09-22 MED ORDER — PAROXETINE HCL 10 MG PO TABS: ORAL_TABLET | ORAL | 1 refills | Status: DC

## 2023-09-22 NOTE — Telephone Encounter (Signed)
 Copied from CRM [id number]. Topic: Clinical - Medication Refill >> Sep 22, 2023  1:39 PM Thersia C wrote: Medication: PARoxetine  (PAXIL ) 10 MG tablet   Has the patient contacted their pharmacy? Yes (Agent: If no, request that the patient contact the pharmacy for the refill. If patient does not wish to contact the pharmacy document the reason why and proceed with request.) (Agent: If yes, when and what did the pharmacy advise?)  This is the patient's preferred pharmacy:   [id number] - [place], [place] - [address] AT [facility] [address] [place] [address] Phone: [phone] Fax: [phone]  Is this the correct pharmacy for this prescription? Yes If no, delete pharmacy and type the correct one.   Has the prescription been filled recently? Yes  Is the patient out of the medication? Yes   Has the patient been seen for an appointment in the last year OR does the patient have an upcoming appointment? Yes  Can we respond through MyChart? Yes  Agent: Please be advised that Rx refills may take up to 3 business days. We ask that you follow-up with your pharmacy.

## 2023-10-25 ENCOUNTER — Other Ambulatory Visit: Payer: Self-pay

## 2023-10-25 DIAGNOSIS — K219 Gastro-esophageal reflux disease without esophagitis: Secondary | ICD-10-CM

## 2023-10-25 MED ORDER — PANTOPRAZOLE SODIUM 40 MG PO TBEC
DELAYED_RELEASE_TABLET | ORAL | 0 refills | Status: DC
Start: 2023-10-25 — End: 2023-10-27

## 2023-10-27 ENCOUNTER — Encounter: Payer: Self-pay | Admitting: Family Medicine

## 2023-10-27 ENCOUNTER — Ambulatory Visit (INDEPENDENT_AMBULATORY_CARE_PROVIDER_SITE_OTHER): Admitting: Family Medicine

## 2023-10-27 VITALS — BP 138/72 | HR 54 | Temp 97.9°F | Wt 168.4 lb

## 2023-10-27 DIAGNOSIS — E038 Other specified hypothyroidism: Secondary | ICD-10-CM

## 2023-10-27 DIAGNOSIS — R0982 Postnasal drip: Secondary | ICD-10-CM | POA: Diagnosis not present

## 2023-10-27 DIAGNOSIS — K219 Gastro-esophageal reflux disease without esophagitis: Secondary | ICD-10-CM

## 2023-10-27 DIAGNOSIS — L988 Other specified disorders of the skin and subcutaneous tissue: Secondary | ICD-10-CM

## 2023-10-27 DIAGNOSIS — I10 Essential (primary) hypertension: Secondary | ICD-10-CM

## 2023-10-27 DIAGNOSIS — E782 Mixed hyperlipidemia: Secondary | ICD-10-CM

## 2023-10-27 LAB — CBC WITH DIFFERENTIAL/PLATELET
Basophils Absolute: 0 K/uL (ref 0.0–0.1)
Basophils Relative: 0.4 % (ref 0.0–3.0)
Eosinophils Absolute: 0.2 K/uL (ref 0.0–0.7)
Eosinophils Relative: 2.9 % (ref 0.0–5.0)
HCT: 40.3 % (ref 36.0–46.0)
Hemoglobin: 13.3 g/dL (ref 12.0–15.0)
Lymphocytes Relative: 32.1 % (ref 12.0–46.0)
Lymphs Abs: 2.5 K/uL (ref 0.7–4.0)
MCHC: 32.9 g/dL (ref 30.0–36.0)
MCV: 89.5 fl (ref 78.0–100.0)
Monocytes Absolute: 0.7 K/uL (ref 0.1–1.0)
Monocytes Relative: 9.2 % (ref 3.0–12.0)
Neutro Abs: 4.3 K/uL (ref 1.4–7.7)
Neutrophils Relative %: 55.4 % (ref 43.0–77.0)
Platelets: 314 K/uL (ref 150.0–400.0)
RBC: 4.5 Mil/uL (ref 3.87–5.11)
RDW: 13.9 % (ref 11.5–15.5)
WBC: 7.7 K/uL (ref 4.0–10.5)

## 2023-10-27 LAB — BASIC METABOLIC PANEL WITH GFR
BUN: 34 mg/dL — ABNORMAL HIGH (ref 6–23)
CO2: 28 meq/L (ref 19–32)
Calcium: 9.8 mg/dL (ref 8.4–10.5)
Chloride: 103 meq/L (ref 96–112)
Creatinine, Ser: 1.11 mg/dL (ref 0.40–1.20)
GFR: 47.48 mL/min — ABNORMAL LOW (ref >60.00)
Glucose, Bld: 97 mg/dL (ref 70–99)
Potassium: 4 meq/L (ref 3.5–5.1)
Sodium: 141 meq/L (ref 135–145)

## 2023-10-27 LAB — HEPATIC FUNCTION PANEL
ALT: 23 U/L (ref 0–35)
AST: 28 U/L (ref 0–37)
Albumin: 4.5 g/dL (ref 3.5–5.2)
Alkaline Phosphatase: 44 U/L (ref 39–117)
Bilirubin, Direct: 0.1 mg/dL (ref 0.0–0.3)
Total Bilirubin: 0.4 mg/dL (ref 0.2–1.2)
Total Protein: 7.2 g/dL (ref 6.0–8.3)

## 2023-10-27 LAB — LIPID PANEL
Cholesterol: 152 mg/dL (ref 0–200)
HDL: 52.2 mg/dL (ref >39.00)
LDL Cholesterol: 74 mg/dL (ref 0–99)
NonHDL: 99.54
Total CHOL/HDL Ratio: 3
Triglycerides: 127 mg/dL (ref 0.0–149.0)
VLDL: 25.4 mg/dL (ref 0.0–40.0)

## 2023-10-27 LAB — TSH: TSH: 1 u[IU]/mL (ref 0.35–5.50)

## 2023-10-27 MED ORDER — LISINOPRIL 40 MG PO TABS: 40.0000 mg | ORAL_TABLET | Freq: Every day | ORAL | 1 refills | Status: AC

## 2023-10-27 MED ORDER — PANTOPRAZOLE SODIUM 40 MG PO TBEC: DELAYED_RELEASE_TABLET | ORAL | 1 refills | Status: AC

## 2023-10-27 MED ORDER — FLUTICASONE PROPIONATE 50 MCG/ACT NA SUSP: 2.0000 | Freq: Every day | NASAL | 1 refills | Status: AC

## 2023-10-27 NOTE — Patient Instructions (Signed)
 Schedule your complete physical in 6 months We'll notify you of your lab results and make any changes if needed Continue to work on healthy diet and regular exercise- you look great! We'll call you to schedule your Dermatology appt- these can take quite awhile, but it's ok since it appears to be benign CONTINUE the Zyrtec daily, ADD the Flonase  daily to decrease congestion and post-nasal drip Call with any questions or concerns Safe [place]!!!

## 2023-10-27 NOTE — Assessment & Plan Note (Signed)
 Chronic problem.  On Amlodipine , Chlorthalidone , Lisinopril , and Spironolactone  daily w/ adequate control.  Currently asymptomatic.  Check labs due to ACE and diuretic use but no anticipated med changes.  Will follow.

## 2023-10-27 NOTE — Progress Notes (Signed)
   Subjective:    Patient ID: Ann Wagner [place], female    DOB: 06-18-1945, 78 y.o.   MRN: [id number]  HPI HTN- chronic problem.  On Amlodipine  10mg  daily, Chlorthalidone  25mg  daily, Lisinopril  40mg  daily, Spironolactone  25mg  daily.  No CP, SOB, HA's, visual changes, edema.  Hyperlipidemia- chronic problem.  On Crestor  20mg  daily, Fenofibrate  160mg  daily.  Denies abd pain, N/V.  Hypothyroid- chronic problem, on Levothyroxine  88mcg daily.  Denies changes to skin/hair/nails.  'a lot of mucous'- pt reports she is taking Zyrtec daily.  Has to clear her throat.  + nasal congestion.  Will cough if she is taking a deep breath or talking.  No fevers.    Skin lesion L breast- not painful, present for awhile.  Dark in center.   Review of Systems For ROS see HPI     Objective:   Physical Exam Vitals reviewed.  Constitutional:      General: She is not in acute distress.    Appearance: Normal appearance. She is well-developed. She is not ill-appearing.  HENT:     Head: Normocephalic and atraumatic.     Nose: Congestion present.     Mouth/Throat:     Pharynx: Posterior oropharyngeal erythema (copious PND) present. No oropharyngeal exudate.  Eyes:     Conjunctiva/sclera: Conjunctivae normal.     Pupils: Pupils are equal, round, and reactive to light.  Neck:     Thyroid : No thyromegaly.  Cardiovascular:     Rate and Rhythm: Normal rate and regular rhythm.     Pulses: Normal pulses.     Heart sounds: Normal heart sounds. No murmur heard. Pulmonary:     Effort: Pulmonary effort is normal. No respiratory distress.     Breath sounds: Normal breath sounds.  Abdominal:     General: There is no distension.     Palpations: Abdomen is soft.     Tenderness: There is no abdominal tenderness.  Musculoskeletal:     Cervical back: Normal range of motion and neck supple.     Right lower leg: No edema.     Left lower leg: No edema.  Lymphadenopathy:     Cervical: No cervical adenopathy.  Skin:     General: Skin is warm and dry.     Findings: Lesion (1 cm demarcated but irregularly pigmented lesion under L breast) present.  Neurological:     General: No focal deficit present.     Mental Status: She is alert and oriented to person, place, and time.  Psychiatric:        Mood and Affect: Mood normal.        Behavior: Behavior normal.        Thought Content: Thought content normal.           Assessment & Plan:  Skin lesion breast- new.  Appears to be a keratosis but will refer to derm for complete evaluation  PND- new.  Suspect this is the cause of pt's cough as lungs are CTAB.  Already taking Zyrtec.  Will add Flonase .  Pt expressed understanding and is in agreement w/ plan.

## 2023-10-27 NOTE — Assessment & Plan Note (Signed)
 Chronic problem.  On Crestor  and Fenofibrate  w/o difficulty.  Check labs.  Adjust meds prn

## 2023-10-27 NOTE — Assessment & Plan Note (Signed)
Chronic problem.  Currently asymptomatic on Levothyroxine 59mg daily.  Check labs.  Adjust meds prn

## 2023-10-31 ENCOUNTER — Ambulatory Visit: Payer: Self-pay | Admitting: Family Medicine

## 2023-10-31 NOTE — Progress Notes (Signed)
 Pt has been notified.

## 2023-11-07 ENCOUNTER — Telehealth: Payer: Self-pay

## 2023-11-07 NOTE — Telephone Encounter (Signed)
 Copied from CRM #[id number]. Topic: General - Other >> Nov 07, 2023 10:15 AM Aleatha BROCKS wrote: Reason for CRM: Patient needs prescription for covid shot for [facility] #[id number] - [place], [place] - [address] AT [facility] OF [facility] [address] & [facility] [address] [place] [address] Phone: [phone] Fax: [phone]

## 2023-11-07 NOTE — Telephone Encounter (Signed)
 Called and informed patient we are not able to accommodate this request at this time and will let her know as soon as there is a plan! Pt voiced understanding

## 2023-11-09 ENCOUNTER — Ambulatory Visit: Payer: Self-pay

## 2023-11-09 ENCOUNTER — Telehealth: Payer: Self-pay

## 2023-11-09 NOTE — Telephone Encounter (Signed)
 Copied from CRM #[id number]. Topic: General - Other >> Nov 07, 2023 10:15 AM Aleatha BROCKS wrote: Reason for CRM: Patient needs prescription for covid shot for [facility] #[id number] - [place], [place] - [address] AT [facility] OF [facility] [address] & [facility] [address] [place] [address] Phone: [phone] Fax: [phone]

## 2023-11-09 NOTE — Telephone Encounter (Signed)
 FYI patient is seeing you tomorrow to address concerns.

## 2023-11-09 NOTE — Telephone Encounter (Signed)
 Called patient. Relayed message below:  At this time, your provider is unable to issue a prescription for the Covid-19 vaccine. We are actively developing protocols to ensure you receive the appropriate documentation required for vaccination. We kindly ask that you check back with our office in 14 business days for an update on the availability of this documentation.   Thank you for your patience and understanding as we work to provide you with safe, accurate, and timely support.    Patient verbalized understanding and did not have any questions

## 2023-11-09 NOTE — Telephone Encounter (Signed)
 FYI Only or Action Required?: FYI only for provider.  Patient was last seen in primary care on 10/27/2023 by Mahlon Comer BRAVO, MD.  Called Nurse Triage reporting Sore Throat.  Symptoms began several days ago.  Interventions attempted: OTC medications: Advil  Sinus.  Symptoms are: gradually worsening.  Triage Disposition: See Physician Within 24 Hours  Patient/caregiver understands and will follow disposition?: Yes       Copied from CRM #[id number]. Topic: Clinical - Red Word Triage >> Nov 09, 2023 11:51 AM Vena HERO wrote: Red Word that prompted transfer to Nurse Triage: Swollen and sore throat, constant sinus drip, ears hurt and possible fever but not sure-going for a few days Reason for Disposition  Earache also present  Answer Assessment - Initial Assessment Questions Patient states she gets symptoms once a year. Patient taking Advil  sinus for symptoms.   1. ONSET: When did the throat start hurting? (Hours or days ago)      A few days ago  2. SEVERITY: How bad is the sore throat? (Scale 1-10; mild, moderate or severe)     7 or 8 out of 10 3.  VIRAL SYMPTOMS: Are there any symptoms of a cold, such as a runny nose, cough, hoarse voice or red eyes?      Patient states she has nasal drip. With green/ yellow nasal drainage.  4. FEVER: Do you have a fever? If Yes, ask: What is your temperature, how was it measured, and when did it start?     Possible but unsure  5. PUS ON THE TONSILS: Is there pus on the tonsils in the back of your throat?     States her throat is swollen and dry  6. OTHER SYMPTOMS: Do you have any other symptoms? (e.g., difficulty breathing, headache, rash)     Ear pain  Protocols used: Sore Throat-A-AH

## 2023-11-10 ENCOUNTER — Encounter: Payer: Self-pay | Admitting: Family Medicine

## 2023-11-10 ENCOUNTER — Ambulatory Visit (INDEPENDENT_AMBULATORY_CARE_PROVIDER_SITE_OTHER): Admitting: Family Medicine

## 2023-11-10 VITALS — BP 142/42 | HR 51 | Temp 98.3°F | Ht 65.0 in | Wt 172.0 lb

## 2023-11-10 DIAGNOSIS — J329 Chronic sinusitis, unspecified: Secondary | ICD-10-CM | POA: Diagnosis not present

## 2023-11-10 DIAGNOSIS — J029 Acute pharyngitis, unspecified: Secondary | ICD-10-CM | POA: Diagnosis not present

## 2023-11-10 DIAGNOSIS — B9689 Other specified bacterial agents as the cause of diseases classified elsewhere: Secondary | ICD-10-CM | POA: Diagnosis not present

## 2023-11-10 MED ORDER — COVID-19 MRNA VACC (MODERNA) 50 MCG/0.5ML IM SUSY: 0.5000 mL | PREFILLED_SYRINGE | Freq: Once | INTRAMUSCULAR | 0 refills | Status: AC

## 2023-11-10 MED ORDER — AMOXICILLIN 875 MG PO TABS: 875.0000 mg | ORAL_TABLET | Freq: Two times a day (BID) | ORAL | 0 refills | Status: AC

## 2023-11-10 NOTE — Progress Notes (Unsigned)
   Subjective:    Patient ID: Ann Wagner Areola, female    DOB: [birth year], 78 y.o.   MRN: [id number]  HPI Sore throat- sxs started ~4 days ago.  Pt has recently been traveling.  + sinus pain, ear fullness, productive cough due to drainage.  No fever.  + fatigue.  Sinus drainage has been yellow/green.  Some improvement w/ Advil  Sinus.  No known sick contacts.   Review of Systems For ROS see HPI     Objective:   Physical Exam Constitutional:      General: She is not in acute distress.    Appearance: She is well-developed.  HENT:     Head: Normocephalic and atraumatic.     Right Ear: Tympanic membrane normal.     Left Ear: Tympanic membrane normal.     Nose: Mucosal edema and congestion present. No rhinorrhea.     Right Sinus: Maxillary sinus tenderness and frontal sinus tenderness present.     Left Sinus: Maxillary sinus tenderness and frontal sinus tenderness present.     Mouth/Throat:     Pharynx: Uvula midline. Posterior oropharyngeal erythema present. No oropharyngeal exudate.  Eyes:     Conjunctiva/sclera: Conjunctivae normal.     Pupils: Pupils are equal, round, and reactive to light.  Cardiovascular:     Rate and Rhythm: Normal rate and regular rhythm.     Heart sounds: Normal heart sounds.  Pulmonary:     Effort: Pulmonary effort is normal. No respiratory distress.     Breath sounds: Normal breath sounds. No wheezing.  Musculoskeletal:     Cervical back: Normal range of motion and neck supple.  Lymphadenopathy:     Cervical: No cervical adenopathy.  Skin:    General: Skin is warm and dry.  Neurological:     General: No focal deficit present.     Mental Status: She is alert and oriented to person, place, and time.     Cranial Nerves: No cranial nerve deficit.     Motor: No weakness.     Coordination: Coordination normal.  Psychiatric:        Mood and Affect: Mood normal.        Behavior: Behavior normal.        Thought Content: Thought content normal.            Assessment & Plan:  Bacterial sinusitis- new.  Pt's sxs and PE consistent w/ dx.  Negative for flu and COVID.  Start Amox BID.  Reviewed supportive care and red flags that should prompt return.  Pt expressed understanding and is in agreement w/ plan.

## 2023-11-10 NOTE — Patient Instructions (Signed)
 Follow up as needed or as scheduled START the Amoxicillin  twice daily for the sinus infection Thankfully flu and COVID are negative Drink LOTS of fluids REST! Get the COVID vaccine after you finish the antibiotics Call with any questions or concerns Hang in there!

## 2023-11-15 LAB — POC COVID19 BINAXNOW: SARS Coronavirus 2 Ag: NEGATIVE

## 2023-11-15 LAB — POCT INFLUENZA A/B
Influenza A, POC: NEGATIVE
Influenza B, POC: NEGATIVE

## 2023-12-02 ENCOUNTER — Other Ambulatory Visit: Payer: Self-pay | Admitting: Cardiology

## 2023-12-05 ENCOUNTER — Other Ambulatory Visit: Payer: Self-pay

## 2023-12-05 DIAGNOSIS — I10 Essential (primary) hypertension: Secondary | ICD-10-CM

## 2023-12-05 MED ORDER — AMLODIPINE BESYLATE 10 MG PO TABS: ORAL_TABLET | ORAL | 1 refills | Status: AC

## 2024-01-22 ENCOUNTER — Other Ambulatory Visit: Payer: Self-pay

## 2024-01-22 DIAGNOSIS — E782 Mixed hyperlipidemia: Secondary | ICD-10-CM

## 2024-01-22 DIAGNOSIS — I2510 Atherosclerotic heart disease of native coronary artery without angina pectoris: Secondary | ICD-10-CM

## 2024-01-22 DIAGNOSIS — I6523 Occlusion and stenosis of bilateral carotid arteries: Secondary | ICD-10-CM

## 2024-01-22 DIAGNOSIS — I10 Essential (primary) hypertension: Secondary | ICD-10-CM

## 2024-01-22 DIAGNOSIS — E038 Other specified hypothyroidism: Secondary | ICD-10-CM

## 2024-01-22 MED ORDER — LEVOTHYROXINE SODIUM 88 MCG PO TABS: ORAL_TABLET | ORAL | 1 refills | Status: AC

## 2024-01-29 MED ORDER — ROSUVASTATIN CALCIUM 20 MG PO TABS: 20.0000 mg | ORAL_TABLET | Freq: Every day | ORAL | 0 refills | Status: AC

## 2024-02-26 ENCOUNTER — Encounter: Payer: Self-pay | Admitting: Physician Assistant

## 2024-02-26 ENCOUNTER — Ambulatory Visit: Admitting: Physician Assistant

## 2024-02-26 VITALS — BP 142/63 | HR 44

## 2024-02-26 DIAGNOSIS — D489 Neoplasm of uncertain behavior, unspecified: Secondary | ICD-10-CM

## 2024-02-26 DIAGNOSIS — L821 Other seborrheic keratosis: Secondary | ICD-10-CM

## 2024-02-26 DIAGNOSIS — D0472 Carcinoma in situ of skin of left lower limb, including hip: Secondary | ICD-10-CM

## 2024-02-26 NOTE — Patient Instructions (Addendum)

## 2024-02-26 NOTE — Progress Notes (Signed)
" ° °  New Patient Visit   Subjective  Ann Wagner is a 78 y.o. female NEW PATIENT who presents for the following: Lesion on Left Breast  Located at the Breast that she would like to have examined.  Patient reports the areas have been there for less than a year She reports the areas are sometimes bothersome. Patient reports the area is sometimes tender She states that the areas have not spread but has grown a little in size Patient reports she has not previously been treated for these areas.  Patient denies Hx of bx. Patient reports family history of skin cancer(s) - father, cannot recall type of skin cancer  Patient would also like to discuss spot on Left lower leg Patient reports the spot has been there for over a year Patient reports the area is not irritated or itchy and is not tender   The patient has spots, moles and lesions to be evaluated, some may be new or changing and the patient may have concern these could be cancer.   The following portions of the chart were reviewed this encounter and updated as appropriate: medications, allergies, medical history  Review of Systems:  No other skin or systemic complaints except as noted in HPI or Assessment and Plan.  Objective  Well appearing patient in no apparent distress; mood and affect are within normal limits.  A focused examination was performed of the following areas: Left Breast and Left Lower Leg  Relevant exam findings are noted in the Assessment and Plan.            Left Breast  Left Posterior Calf 2 cm Pink Scaly Plaque   Assessment & Plan  SEBORRHEIC KERATOSIS - Stuck-on, waxy, tan-brown papules and/or plaques  - Benign-appearing - Discussed benign etiology and prognosis. 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 Posterior Calf - Epidermal / dermal shaving  Lesion diameter (cm):  2 Informed consent: discussed and consent  obtained   Timeout: patient name, date of birth, surgical site, and procedure verified   Procedure prep:  Patient was prepped and draped in usual sterile fashion Prep type:  Isopropyl alcohol Anesthesia: the lesion was anesthetized in a standard fashion   Anesthetic:  1% lidocaine  w/ epinephrine 1-100,000 local infiltration Instrument used: DermaBlade   Hemostasis achieved with: aluminum chloride   Outcome: patient tolerated procedure well   Post-procedure details: sterile dressing applied and wound care instructions given   Dressing type: petrolatum   Additional details:  Pt aware that benign results will be sent to mychart and the staff will call abnormal results will  Specimen A - Surgical pathology Differential Diagnosis: R/O SCC vs ISK  Check Margins: No  Return for PENDING PATH .  I, Lyle Cords, am acting as a neurosurgeon for Ann Gutridge K, PA-C .   Documentation: I have reviewed the above documentation for accuracy and completeness, and I agree with the above.  Valdemar Mcclenahan K, PA-C     "

## 2024-02-27 LAB — SURGICAL PATHOLOGY

## 2024-02-29 ENCOUNTER — Other Ambulatory Visit: Payer: Self-pay | Admitting: Cardiology

## 2024-03-01 ENCOUNTER — Other Ambulatory Visit: Payer: Self-pay | Admitting: Physician Assistant

## 2024-03-03 ENCOUNTER — Ambulatory Visit: Payer: Self-pay | Admitting: Physician Assistant

## 2024-03-06 ENCOUNTER — Other Ambulatory Visit: Payer: Self-pay | Admitting: Cardiology

## 2024-03-06 DIAGNOSIS — R079 Chest pain, unspecified: Secondary | ICD-10-CM

## 2024-03-06 DIAGNOSIS — Z79899 Other long term (current) drug therapy: Secondary | ICD-10-CM

## 2024-03-06 DIAGNOSIS — I10 Essential (primary) hypertension: Secondary | ICD-10-CM

## 2024-03-06 MED ORDER — FENOFIBRATE 160 MG PO TABS: 160.0000 mg | ORAL_TABLET | Freq: Every day | ORAL | 0 refills | Status: DC

## 2024-03-06 NOTE — Telephone Encounter (Signed)
 Please call office to schedule appt w cardiologist for further refills. [phone] thank you 2nd Attempt

## 2024-03-28 ENCOUNTER — Other Ambulatory Visit: Payer: Self-pay | Admitting: Physician Assistant

## 2024-03-28 ENCOUNTER — Other Ambulatory Visit: Payer: Self-pay | Admitting: Cardiology

## 2024-03-29 ENCOUNTER — Other Ambulatory Visit: Payer: Self-pay

## 2024-03-29 ENCOUNTER — Emergency Department (HOSPITAL_BASED_OUTPATIENT_CLINIC_OR_DEPARTMENT_OTHER)
Admission: EM | Admit: 2024-03-29 | Discharge: 2024-03-29 | Disposition: A | Discharge status: Home / Self Care | Attending: Emergency Medicine | Admitting: Emergency Medicine

## 2024-03-29 ENCOUNTER — Emergency Department (HOSPITAL_BASED_OUTPATIENT_CLINIC_OR_DEPARTMENT_OTHER)

## 2024-03-29 ENCOUNTER — Other Ambulatory Visit: Payer: Self-pay | Admitting: Physician Assistant

## 2024-03-29 DIAGNOSIS — Z79899 Other long term (current) drug therapy: Secondary | ICD-10-CM | POA: Diagnosis not present

## 2024-03-29 DIAGNOSIS — W19XXXA Unspecified fall, initial encounter: Secondary | ICD-10-CM

## 2024-03-29 DIAGNOSIS — W000XXA Fall on same level due to ice and snow, initial encounter: Secondary | ICD-10-CM | POA: Insufficient documentation

## 2024-03-29 DIAGNOSIS — I10 Essential (primary) hypertension: Secondary | ICD-10-CM | POA: Diagnosis not present

## 2024-03-29 DIAGNOSIS — M25572 Pain in left ankle and joints of left foot: Secondary | ICD-10-CM | POA: Diagnosis not present

## 2024-03-29 DIAGNOSIS — Z7982 Long term (current) use of aspirin: Secondary | ICD-10-CM | POA: Diagnosis not present

## 2024-03-29 DIAGNOSIS — S82852A Displaced trimalleolar fracture of left lower leg, initial encounter for closed fracture: Secondary | ICD-10-CM | POA: Diagnosis not present

## 2024-03-29 DIAGNOSIS — M25562 Pain in left knee: Secondary | ICD-10-CM

## 2024-03-29 DIAGNOSIS — E039 Hypothyroidism, unspecified: Secondary | ICD-10-CM | POA: Diagnosis not present

## 2024-03-29 DIAGNOSIS — I2510 Atherosclerotic heart disease of native coronary artery without angina pectoris: Secondary | ICD-10-CM | POA: Insufficient documentation

## 2024-03-29 DIAGNOSIS — S80212A Abrasion, left knee, initial encounter: Secondary | ICD-10-CM | POA: Diagnosis not present

## 2024-03-29 DIAGNOSIS — S99912A Unspecified injury of left ankle, initial encounter: Secondary | ICD-10-CM | POA: Diagnosis present

## 2024-03-29 MED ORDER — PAROXETINE HCL 10 MG PO TABS: ORAL_TABLET | ORAL | 0 refills | Status: AC

## 2024-03-29 NOTE — ED Provider Notes (Signed)
 "  EMERGENCY DEPARTMENT AT [facility] Provider Note   CSN: [id number] Arrival date & time: 03/29/24  9077     Patient presents with: No chief complaint on file.   Ann Wagner is a 79 y.o. female with past medical history of GERD, hypothyroid, HTN, HLD, CAD presenting to ER with complaint of fall. Patient reports that two days ago she was walking and slipped on ice falling onto left knee and twist left ankle, able to catch herself with her right wrist. She did not hit her head during fall.  Patient is not on blood thinner. Complains mostly of left ankle pain and swelling, unable to bear weight due to pain. No upper ext/hand pain. No HA, neck pain, CP, SOB.   HPI     Prior to Admission medications  Medication Sig Start Date End Date Taking? Authorizing Provider  albuterol  (PROVENTIL  HFA;VENTOLIN  HFA) 108 (90 Base) MCG/ACT inhaler Inhale 2 puffs into the lungs every 6 (six) hours as needed for wheezing or shortness of breath. 12/12/16   [name], PA-C  amLODipine  (NORVASC ) 10 MG tablet TAKE 1 TABLET(10 MG) BY MOUTH DAILY 12/05/23   [name], [name], MD  Apoaequorin (PREVAGEN EXTRA STRENGTH) 20 MG CAPS Take 20 mg by mouth daily.    [provider]  aspirin  EC 81 MG tablet Take 1 tablet (81 mg total) by mouth daily. 08/04/14   [name], MD  chlorthalidone  (HYGROTON ) 25 MG tablet TAKE 1 TABLET(25 MG) BY MOUTH DAILY 03/01/24   [name], PA-C  dorzolamide-timolol (COSOPT) 2-0.5 % ophthalmic solution Place 1 drop into the left eye 2 (two) times daily. 08/03/23   [provider]  fenofibrate  160 MG tablet Take 1 tablet (160 mg total) by mouth daily. 03/07/24   [name], PA-C  FLUARIX 0.5 ML injection Inject 0.5 mLs into the muscle once. 11/04/22   [provider]  fluticasone  (FLONASE ) 50 MCG/ACT nasal spray Place 2 sprays into both nostrils daily. 10/27/23   Mahlon Comer BRAVO, MD  isosorbide  mononitrate (IMDUR ) 30 MG 24 hr  tablet Take 1 tablet (30 mg total) by mouth daily. 03/01/24   [name], PA-C  levothyroxine  (SYNTHROID ) 88 MCG tablet TAKE 1 TABLET(88 MCG) BY MOUTH DAILY 01/22/24   [name], [name], MD  lisinopril  (ZESTRIL ) 40 MG tablet Take 1 tablet (40 mg total) by mouth daily. 10/27/23   [name], [name], MD  loratadine (CLARITIN) 10 MG tablet Take 10 mg by mouth daily.    [provider]  methocarbamol  (ROBAXIN ) 500 MG tablet Take 1 tablet (500 mg total) by mouth every 8 (eight) hours as needed for muscle spasms. 06/03/21   [name], [name], MD  nitroGLYCERIN  (NITROSTAT ) 0.4 MG SL tablet PLACE 1 TABLET UNDER TONGUE EVERY 5 MINUTES AS NEEDED FOR CHEST PAIN 06/15/21   [name], MD  ondansetron  (ZOFRAN -ODT) 4 MG disintegrating tablet 4mg  ODT q4 hours prn nausea/vomit 08/05/23   [name], MD  pantoprazole  (PROTONIX ) 40 MG tablet TAKE 1 TABLET BY MOUTH DAILY AT NOON 10/27/23   [name], [name], MD  PARoxetine  (PAXIL ) 10 MG tablet TAKE 1 TABLET(10 MG) BY MOUTH DAILY 09/22/23   [name], [name], MD  potassium chloride (K-DUR) 10 MEQ tablet Take 10 mEq by mouth once.    [provider]  rosuvastatin  (CRESTOR ) 20 MG tablet Take 1 tablet (20 mg total) by mouth daily. 01/29/24   [name], PA-C  SPIKEVAX syringe Inject 0.5 mLs into  the muscle once. 11/04/22   [provider]  spironolactone  (ALDACTONE ) 25 MG tablet TAKE 1 TABLET(25 MG) BY MOUTH DAILY 03/06/24   [name], [name], PA-C  timolol (TIMOPTIC) 0.5 % ophthalmic solution Place 1 drop into the left eye 2 (two) times daily. 12/29/22   [provider]    Allergies: Propoxyphene n-acetaminophen     Review of Systems  Musculoskeletal:  Positive for arthralgias.    Updated Vital Signs There were no vitals taken for this visit.  Physical Exam Vitals and nursing note reviewed.  Constitutional:      General: She is not in acute distress.    Appearance: She is not toxic-appearing.  HENT:      Head: Normocephalic and atraumatic.  Eyes:     General: No scleral icterus.    Conjunctiva/sclera: Conjunctivae normal.  Cardiovascular:     Rate and Rhythm: Normal rate and regular rhythm.     Pulses: Normal pulses.     Heart sounds: Normal heart sounds.  Pulmonary:     Effort: Pulmonary effort is normal. No respiratory distress.     Breath sounds: Normal breath sounds.  Abdominal:     General: Abdomen is flat. Bowel sounds are normal.     Palpations: Abdomen is soft.     Tenderness: There is no abdominal tenderness.  Musculoskeletal:     Comments: Left ankle swelling, TTP over medial > & lateral malleolus , strong DP pulse, well perfused.  Small superficial abrasion to left knee.   Skin:    General: Skin is warm and dry.     Findings: No lesion.  Neurological:     General: No focal deficit present.     Mental Status: She is alert and oriented to person, place, and time. Mental status is at baseline.     (all labs ordered are listed, but only abnormal results are displayed) Labs Reviewed - No data to display  EKG: None  Radiology: No results found.   Procedures   Medications Ordered in the ED - No data to display                                  Medical Decision Making Amount and/or Complexity of Data Reviewed Radiology: ordered.   This patient presents to the ED for concern of fall, this involves an extensive number of treatment options, and is a complaint that carries with it a high risk of complications and morbidity.  The differential diagnosis includes intracranial hemorrhage, subdural/epidural hematoma, vertebral fracture, spinal cord injury, muscle strain, skull fracture, fracture, splenic injury, liver injury, perforated viscus, contusions.   Imaging Studies ordered:  I ordered imaging studies including left knee and ankle.   I independently visualized and interpreted imaging which showed no acute findings of left knee x-ray, left ankle shows a fracture  of the distal fibula and distal tibia which may represent trimalleolar fracture, there may be mild widening of the medial ankle mortise. I agree with the radiologist interpretation   Cardiac Monitoring: / EKG:  The patient was maintained on a cardiac monitor.     Problem List / ED Course / Critical interventions / Medication management  Patient presents to emergency room with complaint of fall.  Patient reports that she slipped on ice and fell onto her left knee twisting her left ankle.  When she fell she did not hit her head or lose consciousness.  She is not on a  blood thinner.  She mostly complains of left ankle pain this is reproducible on exam with mild swelling.  She is neurovascularly intact distally with strong dorsal pedal pulse.  She is able to easily raise her left leg.  No tenderness over hip.  No bruising over chest and abdomen.  Head appears atraumatic. X-ray of the left knee is unremarkable.  X-ray of the left ankle shows fracture of distal tibia and distal fibula.  This appears to be in good alignment and not requiring a reduction at this time.  Patient was placed in a splint here and tolerated well. Lives at home alone, but has family with her who will help take care of her with this, pain is well controlled and patient and family feel comfortable going home. She has been using knee scooter at home the last two days and has been getting around lower floor of her house with this without problem. I have reviewed the patients home medicines and have made adjustments as needed. Patient hemodynamically stable and well-appearing.  Feel appropriate for discharge with outpatient follow-up.  Given orthopedic follow-up.        Final diagnoses:  Closed trimalleolar fracture of left ankle, initial encounter  Acute pain of left knee  Fall, initial encounter    ED Discharge Orders     None          Shermon Warren SAILOR, PA-C 03/29/24 1108    Emil Share, DO 03/29/24 1109  "

## 2024-03-29 NOTE — ED Notes (Signed)
 Denies hitting head, denies thinners.

## 2024-03-29 NOTE — ED Triage Notes (Signed)
 Pt reports slipped on ice on Wednesday, injuring L leg. Reports ankle pain, knee pain, some numbness and tingling in toes. Swelling noted to LLE. Abrasion to L knee.

## 2024-03-29 NOTE — Discharge Instructions (Signed)
 Please call orthopedics to follow up within a week (please call today).  You can take 1000 mg of Tylenol  every 8 hours.  You can take 600 mg of ibuprofen  with food every 8 hours.  Make sure that you elevate your ankle when resting.  Do not put weight on your ankle.   Return to the ED with new or worsening symptoms.

## 2024-04-05 ENCOUNTER — Ambulatory Visit: Admission: RE | Admit: 2024-04-05 | Source: Ambulatory Visit

## 2024-04-05 ENCOUNTER — Other Ambulatory Visit: Payer: Self-pay | Admitting: Orthopaedic Surgery

## 2024-04-05 DIAGNOSIS — M25572 Pain in left ankle and joints of left foot: Secondary | ICD-10-CM

## 2024-05-07 ENCOUNTER — Encounter: Admitting: Dermatology

## 2024-06-03 ENCOUNTER — Encounter: Admitting: Dermatology

## 2024-09-10 ENCOUNTER — Encounter
